# Patient Record
Sex: Male | Born: 1960 | Race: Black or African American | Hispanic: No | Smoking: Former smoker
Health system: Southern US, Community
[De-identification: ages and names within clinical notes are randomized; demographics above are authoritative.]

## PROBLEM LIST (undated history)

## (undated) DIAGNOSIS — N529 Male erectile dysfunction, unspecified: Secondary | ICD-10-CM

## (undated) DIAGNOSIS — N401 Enlarged prostate with lower urinary tract symptoms: Secondary | ICD-10-CM

## (undated) DIAGNOSIS — J449 Chronic obstructive pulmonary disease, unspecified: Secondary | ICD-10-CM

## (undated) DIAGNOSIS — K7460 Unspecified cirrhosis of liver: Secondary | ICD-10-CM

## (undated) DIAGNOSIS — J984 Other disorders of lung: Secondary | ICD-10-CM

## (undated) DIAGNOSIS — K8020 Calculus of gallbladder without cholecystitis without obstruction: Secondary | ICD-10-CM

## (undated) DIAGNOSIS — F17200 Nicotine dependence, unspecified, uncomplicated: Secondary | ICD-10-CM

## (undated) DIAGNOSIS — J4540 Moderate persistent asthma, uncomplicated: Secondary | ICD-10-CM

## (undated) DIAGNOSIS — Z8619 Personal history of other infectious and parasitic diseases: Secondary | ICD-10-CM

## (undated) DIAGNOSIS — R112 Nausea with vomiting, unspecified: Secondary | ICD-10-CM

## (undated) DIAGNOSIS — Z9889 Other specified postprocedural states: Secondary | ICD-10-CM

## (undated) DIAGNOSIS — B1920 Unspecified viral hepatitis C without hepatic coma: Secondary | ICD-10-CM

## (undated) DIAGNOSIS — J432 Centrilobular emphysema: Secondary | ICD-10-CM

## (undated) DIAGNOSIS — F1911 Other psychoactive substance abuse, in remission: Secondary | ICD-10-CM

## (undated) DIAGNOSIS — J439 Emphysema, unspecified: Secondary | ICD-10-CM

## (undated) DIAGNOSIS — R0902 Hypoxemia: Secondary | ICD-10-CM

## (undated) DIAGNOSIS — G8929 Other chronic pain: Secondary | ICD-10-CM

## (undated) HISTORY — DX: Calculus of gallbladder without cholecystitis without obstruction: K80.20

## (undated) HISTORY — DX: Hypoxemia: R09.02

## (undated) HISTORY — PX: KNEE ARTHROSCOPY: SUR90

## (undated) HISTORY — PX: COLONOSCOPY: SHX174

## (undated) HISTORY — DX: Unspecified cirrhosis of liver: K74.60

## (undated) HISTORY — DX: Chronic obstructive pulmonary disease, unspecified: J44.9

## (undated) HISTORY — PX: UPPER GASTROINTESTINAL ENDOSCOPY: SHX188

---

## 1998-08-07 ENCOUNTER — Emergency Department (HOSPITAL_COMMUNITY): Admission: EM | Admit: 1998-08-07 | Discharge: 1998-08-07 | Payer: Self-pay | Admitting: Emergency Medicine

## 1998-11-18 ENCOUNTER — Emergency Department (HOSPITAL_COMMUNITY): Admission: EM | Admit: 1998-11-18 | Discharge: 1998-11-18 | Payer: Self-pay | Admitting: Emergency Medicine

## 1998-12-26 ENCOUNTER — Ambulatory Visit (HOSPITAL_COMMUNITY): Admission: RE | Admit: 1998-12-26 | Discharge: 1998-12-26 | Payer: Self-pay | Admitting: Orthopaedic Surgery

## 1999-06-03 ENCOUNTER — Emergency Department (HOSPITAL_COMMUNITY): Admission: EM | Admit: 1999-06-03 | Discharge: 1999-06-03 | Payer: Self-pay | Admitting: Emergency Medicine

## 1999-06-03 ENCOUNTER — Encounter: Payer: Self-pay | Admitting: Emergency Medicine

## 1999-12-18 ENCOUNTER — Emergency Department (HOSPITAL_COMMUNITY): Admission: EM | Admit: 1999-12-18 | Discharge: 1999-12-18 | Payer: Self-pay | Admitting: Emergency Medicine

## 2004-01-23 ENCOUNTER — Emergency Department (HOSPITAL_COMMUNITY): Admission: EM | Admit: 2004-01-23 | Discharge: 2004-01-23 | Payer: Self-pay | Admitting: Emergency Medicine

## 2005-11-11 ENCOUNTER — Ambulatory Visit: Payer: Self-pay | Admitting: Internal Medicine

## 2005-11-14 ENCOUNTER — Ambulatory Visit: Payer: Self-pay | Admitting: *Deleted

## 2005-11-15 ENCOUNTER — Ambulatory Visit (HOSPITAL_COMMUNITY): Admission: RE | Admit: 2005-11-15 | Discharge: 2005-11-15 | Payer: Self-pay | Admitting: Internal Medicine

## 2008-08-14 ENCOUNTER — Ambulatory Visit: Payer: Self-pay | Admitting: Internal Medicine

## 2008-08-14 LAB — CONVERTED CEMR LAB
AST: 151 units/L — ABNORMAL HIGH (ref 0–37)
Albumin: 3.3 g/dL — ABNORMAL LOW (ref 3.5–5.2)
Alkaline Phosphatase: 121 units/L — ABNORMAL HIGH (ref 39–117)
Basophils Absolute: 0 10*3/uL (ref 0.0–0.1)
Basophils Relative: 1 % (ref 0–1)
Eosinophils Absolute: 0.1 10*3/uL (ref 0.0–0.7)
Eosinophils Relative: 3 % (ref 0–5)
Glucose, Bld: 84 mg/dL (ref 70–99)
HCT: 45.5 % (ref 39.0–52.0)
Lymphs Abs: 2.1 10*3/uL (ref 0.7–4.0)
MCHC: 32.7 g/dL (ref 30.0–36.0)
MCV: 98.3 fL (ref 78.0–100.0)
Neutrophils Relative %: 32 % — ABNORMAL LOW (ref 43–77)
Platelets: 81 10*3/uL — ABNORMAL LOW (ref 150–400)
Potassium: 4.1 meq/L (ref 3.5–5.3)
RDW: 15.1 % (ref 11.5–15.5)
Sodium: 137 meq/L (ref 135–145)
Total Bilirubin: 1.6 mg/dL — ABNORMAL HIGH (ref 0.3–1.2)
Total Protein: 7.8 g/dL (ref 6.0–8.3)
WBC: 4.1 10*3/uL (ref 4.0–10.5)

## 2008-08-25 ENCOUNTER — Ambulatory Visit: Payer: Self-pay | Admitting: Internal Medicine

## 2008-08-25 ENCOUNTER — Encounter (INDEPENDENT_AMBULATORY_CARE_PROVIDER_SITE_OTHER): Payer: Self-pay | Admitting: Internal Medicine

## 2008-08-25 LAB — CONVERTED CEMR LAB
Eosinophils Relative: 2 % (ref 0–5)
HCT: 45 % (ref 39.0–52.0)
Hep B E Ab: NEGATIVE
Hep B S Ab: NEGATIVE
Hepatitis B Surface Ag: NEGATIVE
Lymphocytes Relative: 41 % (ref 12–46)
Lymphs Abs: 2 10*3/uL (ref 0.7–4.0)
MCV: 99.3 fL (ref 78.0–100.0)
Monocytes Relative: 13 % — ABNORMAL HIGH (ref 3–12)
Neutrophils Relative %: 43 % (ref 43–77)
Platelets: 79 10*3/uL — ABNORMAL LOW (ref 150–400)
RBC: 4.53 M/uL (ref 4.22–5.81)
WBC: 4.8 10*3/uL (ref 4.0–10.5)

## 2008-09-12 ENCOUNTER — Ambulatory Visit (HOSPITAL_COMMUNITY): Admission: RE | Admit: 2008-09-12 | Discharge: 2008-09-12 | Payer: Self-pay | Admitting: Internal Medicine

## 2008-09-16 ENCOUNTER — Encounter (INDEPENDENT_AMBULATORY_CARE_PROVIDER_SITE_OTHER): Payer: Self-pay | Admitting: Internal Medicine

## 2008-09-16 ENCOUNTER — Ambulatory Visit: Payer: Self-pay | Admitting: Family Medicine

## 2008-09-16 LAB — CONVERTED CEMR LAB
HIV 1 RNA Quant: <48 copies/mL (ref <48)
HIV-1 RNA Quant, Log: <1.68 (ref <1.68)

## 2008-09-29 ENCOUNTER — Ambulatory Visit: Payer: Self-pay | Admitting: Internal Medicine

## 2008-10-09 ENCOUNTER — Ambulatory Visit: Payer: Self-pay | Admitting: Gastroenterology

## 2008-10-09 ENCOUNTER — Encounter: Payer: Self-pay | Admitting: Gastroenterology

## 2008-10-17 ENCOUNTER — Telehealth: Payer: Self-pay | Admitting: Internal Medicine

## 2008-11-10 ENCOUNTER — Ambulatory Visit: Payer: Self-pay | Admitting: Gastroenterology

## 2008-11-10 DIAGNOSIS — B182 Chronic viral hepatitis C: Secondary | ICD-10-CM | POA: Insufficient documentation

## 2008-11-13 ENCOUNTER — Ambulatory Visit: Payer: Self-pay | Admitting: Gastroenterology

## 2008-11-13 ENCOUNTER — Telehealth: Payer: Self-pay | Admitting: Gastroenterology

## 2008-11-20 ENCOUNTER — Ambulatory Visit (HOSPITAL_COMMUNITY): Admission: RE | Admit: 2008-11-20 | Discharge: 2008-11-20 | Payer: Self-pay | Admitting: Gastroenterology

## 2008-11-27 ENCOUNTER — Encounter: Payer: Self-pay | Admitting: Internal Medicine

## 2009-01-06 ENCOUNTER — Ambulatory Visit: Payer: Self-pay | Admitting: Gastroenterology

## 2009-03-19 ENCOUNTER — Ambulatory Visit: Payer: Self-pay | Admitting: Gastroenterology

## 2009-03-20 ENCOUNTER — Ambulatory Visit (HOSPITAL_COMMUNITY): Admission: RE | Admit: 2009-03-20 | Discharge: 2009-03-20 | Payer: Self-pay | Admitting: Gastroenterology

## 2009-03-31 ENCOUNTER — Ambulatory Visit: Payer: Self-pay | Admitting: Gastroenterology

## 2009-04-21 ENCOUNTER — Ambulatory Visit: Payer: Self-pay | Admitting: Gastroenterology

## 2009-05-11 ENCOUNTER — Ambulatory Visit: Payer: Self-pay | Admitting: Gastroenterology

## 2009-06-04 ENCOUNTER — Ambulatory Visit: Payer: Self-pay | Admitting: Gastroenterology

## 2009-07-09 ENCOUNTER — Ambulatory Visit: Payer: Self-pay | Admitting: Gastroenterology

## 2009-09-03 ENCOUNTER — Ambulatory Visit: Payer: Self-pay | Admitting: Gastroenterology

## 2009-09-14 ENCOUNTER — Ambulatory Visit (HOSPITAL_COMMUNITY): Admission: RE | Admit: 2009-09-14 | Discharge: 2009-09-14 | Payer: Self-pay | Admitting: Gastroenterology

## 2009-10-09 ENCOUNTER — Encounter: Payer: Self-pay | Admitting: Gastroenterology

## 2009-10-20 ENCOUNTER — Encounter (INDEPENDENT_AMBULATORY_CARE_PROVIDER_SITE_OTHER): Payer: Self-pay | Admitting: *Deleted

## 2010-03-23 ENCOUNTER — Encounter (INDEPENDENT_AMBULATORY_CARE_PROVIDER_SITE_OTHER): Payer: Self-pay | Admitting: *Deleted

## 2010-03-24 ENCOUNTER — Ambulatory Visit: Payer: Self-pay | Admitting: Gastroenterology

## 2010-03-29 ENCOUNTER — Ambulatory Visit: Payer: Self-pay | Admitting: Gastroenterology

## 2010-05-06 ENCOUNTER — Ambulatory Visit: Payer: Self-pay | Admitting: Gastroenterology

## 2010-06-21 ENCOUNTER — Ambulatory Visit (HOSPITAL_COMMUNITY): Admission: RE | Admit: 2010-06-21 | Discharge: 2010-06-21 | Payer: Self-pay | Admitting: Gastroenterology

## 2010-09-11 ENCOUNTER — Emergency Department (HOSPITAL_COMMUNITY)
Admission: EM | Admit: 2010-09-11 | Discharge: 2010-09-11 | Payer: Self-pay | Source: Home / Self Care | Admitting: Emergency Medicine

## 2010-09-18 ENCOUNTER — Encounter: Payer: Self-pay | Admitting: Gastroenterology

## 2010-09-19 ENCOUNTER — Encounter: Payer: Self-pay | Admitting: Gastroenterology

## 2010-09-28 NOTE — Procedures (Signed)
Summary: Upper Endoscopy  Patient: [name] Note: All result statuses are Final unless otherwise noted.  Tests: (1) Upper Endoscopy (EGD)   EGD Upper Endoscopy       DONE     [facility]     [address]     [place], [place]  [address]           ENDOSCOPY PROCEDURE REPORT           PATIENT:  Dylan [name], Dylan [name]  MR#:  [id number]     BIRTHDATE:  1961/01/16, 49 yrs. old  GENDER:  male           ENDOSCOPIST:  Barbette Hair. Arlyce Dice, MD     Referred by:           PROCEDURE DATE:  03/29/2010     PROCEDURE:  EGD, diagnostic     ASA CLASS:  Class II     INDICATIONS:  Known esophageal varices, for surveillance exam.           MEDICATIONS:   Fentanyl 75 mcg IV, Versed 9 mg IV, glycopyrrolate     (Robinal) 0.2 mg IV, 0.6cc simethancone 0.6 cc PO     TOPICAL ANESTHETIC:  Exactacain Spray           DESCRIPTION OF PROCEDURE:   After the risks benefits and     alternatives of the procedure were thoroughly explained, informed     consent was obtained.  The LB GIF-H180 [id number] endoscope was     introduced through the mouth and advanced to the third portion of     the duodenum, without limitations.  The instrument was slowly     withdrawn as the mucosa was fully examined.     <<PROCEDUREIMAGES>>           Grade I varices were found in the distal esophagus (see image1 and     image2). Few grade 1 varices at GE junction extending 3-4cm     Otherwise the examination was normal (see image3 and image5).     Retroflexed views revealed no abnormalities.    The scope was then     withdrawn from the patient and the procedure completed.           COMPLICATIONS:  None           ENDOSCOPIC IMPRESSION:     1) Grade I varices in the distal esophagus     2) Otherwise normal examination     RECOMMENDATIONS:F/U endoscopy 2 years           REPEAT EXAM:  In 2 year(s) for EGD.           ______________________________     Barbette Hair. Arlyce Dice, MD           CC:  Brooke Dare, MD, Yisroel Ramming, MD       n.     eSIGNED:   Barbette Hair. Theordore Cisnero at 03/29/2010 11:50 AM           Montel Culver, [id number]  Note: An exclamation mark (!) indicates a result that was not dispersed into the flowsheet. Document Creation Date: 03/29/2010 11:50 AM _______________________________________________________________________  (1) Order result status: Final Collection or observation date-time: 03/29/2010 11:29 Requested date-time:  Receipt date-time:  Reported date-time:  Referring Physician:   Ordering Physician: Melvia Heaps [phone]) Specimen Source:  Source: Launa Grill Order Number: [id number] Lab site:   Appended Document: Upper Endoscopy    Clinical Lists Changes  Observations: Added new  observation of EGD DUE: 03/2012 (03/29/2010 13:47)

## 2010-09-28 NOTE — Miscellaneous (Signed)
Summary: REC EGD/previsit done/rm  Clinical Lists Changes  Observations: Added new observation of ALLERGY REV: Done (03/24/2010 8:28)

## 2010-09-28 NOTE — Letter (Signed)
Summary: Endoscopy Letter  [facility]  [address] [place], [place] [address]   Phone: [phone]  Fax: [phone]      October 20, 2009 MRN: [id number]   KORDEL LEAVY [address] [place], [place]  [address]   Dear Mr. SELF,   According to your medical record, it is time for you to schedule an Endoscopy. Endoscopic screening is recommended for patients with certain upper digestive tract conditions because of associated increased risk for cancers of the upper digestive system.  This letter has been generated based on the recommendations made at the time of your prior procedure. If you feel that in your particular situation this may no longer apply, please contact our office.  Please call our office at [phone]) to schedule this appointment or to update your records at your earliest convenience.  Thank you for cooperating with [place] to provide you with the very best care possible.   Sincerely,  Barbette Hair. Arlyce Dice, M.D.  [facility] [phone]

## 2010-09-28 NOTE — Procedures (Signed)
Summary: Recall / [facility]  Recall /  [facility]   Imported By: Lennie Odor 01/28/2010 14:36:14  _____________________________________________________________________  External Attachment:    Type:   Image     Comment:   External Document

## 2010-09-28 NOTE — Letter (Signed)
Summary: EGD Instructions  [facility]  [address] [place], [place] [address]   Phone: [phone]  Fax: [phone]       Dylan [name]    [birth year]    MRN: [id number]       Procedure Day /Date:  Monday 03/29/2010     Arrival Time: 10:00 am     Procedure Time: 11:00 am     Location of Procedure:                    _x  _ [facility] (4th Floor)    PREPARATION FOR ENDOSCOPY   On Monday 8/1  THE DAY OF THE PROCEDURE:  1.   No solid foods, milk or milk products are allowed after midnight the night before your procedure.  2.   Do not drink anything colored red or purple.  Avoid juices with pulp.  No orange juice.  3.  You may drink clear liquids until 9:00 am, which is 2 hours before your procedure.                                                                                                CLEAR LIQUIDS INCLUDE: Water Jello Ice Popsicles Tea (sugar ok, no milk/cream) Powdered fruit flavored drinks Coffee (sugar ok, no milk/cream) Gatorade Juice: apple, white grape, white cranberry  Lemonade Clear bullion, consomm, broth Carbonated beverages (any kind) Strained chicken noodle soup Hard Candy   MEDICATION INSTRUCTIONS  Unless otherwise instructed, you should take regular prescription medications with a small sip of water as early as possible the morning of your procedure.    Additional medication instructions: n/a             OTHER INSTRUCTIONS  You will need a responsible adult at least 51 years of age to accompany you and drive you home.   This person must remain in the waiting room during your procedure.  Wear loose fitting clothing that is easily removed.  Leave jewelry and other valuables at home.  However, you may wish to bring a book to read or an iPod/MP3 player to listen to music as you wait for your procedure to start.  Remove all body piercing jewelry and leave at home.  Total time from sign-in until discharge  is approximately 2-3 hours.  You should go home directly after your procedure and rest.  You can resume normal activities the day after your procedure.  The day of your procedure you should not:   Drive   Make legal decisions   Operate machinery   Drink alcohol   Return to work  You will receive specific instructions about eating, activities and medications before you leave.    The above instructions have been reviewed and explained to me by   Sherren Kerns RN  March 24, 2010 8:52 AM     I fully understand and can verbalize these instructions _____________________________ Date _________

## 2010-12-05 LAB — BUN: BUN: 10 mg/dL (ref 6–23)

## 2010-12-09 LAB — CREATININE, SERUM
Creatinine, Ser: 0.94 mg/dL (ref 0.4–1.5)
GFR calc non Af Amer: >60 mL/min (ref >60)

## 2011-03-15 ENCOUNTER — Other Ambulatory Visit: Payer: Self-pay | Admitting: Gastroenterology

## 2011-03-15 DIAGNOSIS — B182 Chronic viral hepatitis C: Secondary | ICD-10-CM

## 2011-03-18 ENCOUNTER — Other Ambulatory Visit (HOSPITAL_COMMUNITY): Payer: Self-pay

## 2011-03-18 ENCOUNTER — Ambulatory Visit (HOSPITAL_COMMUNITY)
Admission: RE | Admit: 2011-03-18 | Discharge: 2011-03-18 | Disposition: A | Discharge status: Home / Self Care | Payer: Self-pay | Source: Ambulatory Visit | Attending: Gastroenterology | Admitting: Gastroenterology

## 2011-03-18 DIAGNOSIS — B182 Chronic viral hepatitis C: Secondary | ICD-10-CM | POA: Insufficient documentation

## 2011-03-18 DIAGNOSIS — K7460 Unspecified cirrhosis of liver: Secondary | ICD-10-CM | POA: Insufficient documentation

## 2011-03-18 LAB — CREATININE, SERUM
Creatinine, Ser: 0.95 mg/dL (ref 0.50–1.35)
GFR calc Af Amer: >60 mL/min (ref >60)
GFR calc non Af Amer: >60 mL/min (ref >60)

## 2011-03-18 MED ORDER — GADOBENATE DIMEGLUMINE 529 MG/ML IV SOLN
14.0000 mL | Freq: Once | INTRAVENOUS | Status: AC
  Administered 2011-03-18: 14 mL via INTRAVENOUS

## 2011-03-24 ENCOUNTER — Other Ambulatory Visit: Payer: Self-pay | Admitting: Gastroenterology

## 2011-03-24 ENCOUNTER — Ambulatory Visit (INDEPENDENT_AMBULATORY_CARE_PROVIDER_SITE_OTHER): Payer: Self-pay | Admitting: Gastroenterology

## 2011-03-24 VITALS — BP 144/87 | HR 87 | Temp 98.2°F | Ht 66.5 in | Wt 161.0 lb

## 2011-03-24 DIAGNOSIS — K7460 Unspecified cirrhosis of liver: Secondary | ICD-10-CM

## 2011-03-24 DIAGNOSIS — B182 Chronic viral hepatitis C: Secondary | ICD-10-CM

## 2011-03-24 LAB — COMPREHENSIVE METABOLIC PANEL
AST: 156 U/L — ABNORMAL HIGH (ref 0–37)
Alkaline Phosphatase: 159 U/L — ABNORMAL HIGH (ref 39–117)
BUN: 9 mg/dL (ref 6–23)
Calcium: 8.8 mg/dL (ref 8.4–10.5)
Chloride: 105 mEq/L (ref 96–112)
Creat: 1.02 mg/dL (ref 0.50–1.35)
Total Bilirubin: 1.6 mg/dL — ABNORMAL HIGH (ref 0.3–1.2)

## 2011-03-24 LAB — PROTIME-INR: Prothrombin Time: 15.2 seconds (ref 11.6–15.2)

## 2011-03-25 LAB — CBC WITH DIFFERENTIAL/PLATELET
Basophils Absolute: 0 10*3/uL (ref 0.0–0.1)
Basophils Relative: 0 % (ref 0–1)
Eosinophils Absolute: 0.2 10*3/uL (ref 0.0–0.7)
Eosinophils Relative: 5 % (ref 0–5)
HCT: 42.6 % (ref 39.0–52.0)
Hemoglobin: 14.8 g/dL (ref 13.0–17.0)
Lymphocytes Relative: 45 % (ref 12–46)
Lymphs Abs: 2.1 10*3/uL (ref 0.7–4.0)
MCH: 33.9 pg (ref 26.0–34.0)
MCHC: 34.7 g/dL (ref 30.0–36.0)
MCV: 97.7 fL (ref 78.0–100.0)
Monocytes Absolute: 0.6 10*3/uL (ref 0.1–1.0)
Monocytes Relative: 14 % — ABNORMAL HIGH (ref 3–12)
Neutro Abs: 1.6 10*3/uL — ABNORMAL LOW (ref 1.7–7.7)
Neutrophils Relative %: 36 % — ABNORMAL LOW (ref 43–77)
Platelets: 69 10*3/uL — ABNORMAL LOW (ref 150–400)
RBC: 4.36 MIL/uL (ref 4.22–5.81)
RDW: 17.3 % — ABNORMAL HIGH (ref 11.5–15.5)
WBC: 4.5 10*3/uL (ref 4.0–10.5)

## 2011-03-30 ENCOUNTER — Telehealth: Payer: Self-pay | Admitting: Gastroenterology

## 2011-03-30 DIAGNOSIS — K7460 Unspecified cirrhosis of liver: Secondary | ICD-10-CM

## 2011-03-30 DIAGNOSIS — B182 Chronic viral hepatitis C: Secondary | ICD-10-CM

## 2011-03-30 DIAGNOSIS — C228 Malignant neoplasm of liver, primary, unspecified as to type: Secondary | ICD-10-CM

## 2011-03-30 NOTE — Telephone Encounter (Signed)
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 A three month follow up in [place] was suggested. I explained this to Dylan [name]. We also discussed treatment of his Hep C. I discussed the possible diminished response rate due to ethnicity and cirrhosis and the side effects of treatment including the risk of infection leading to decompensation. Dylan [name] is willing to consider treatment. He will be by th [facility] office tomorrow to get the [employer] and [place] forms. We also discussed the risks of contagion.

## 2011-03-31 ENCOUNTER — Other Ambulatory Visit: Payer: Self-pay | Admitting: Gastroenterology

## 2011-03-31 DIAGNOSIS — B182 Chronic viral hepatitis C: Secondary | ICD-10-CM

## 2011-03-31 DIAGNOSIS — K7460 Unspecified cirrhosis of liver: Secondary | ICD-10-CM

## 2011-03-31 NOTE — Progress Notes (Signed)
NAME:  Dylan [name], Dylan [name]  Dylan#:  [id number]      DATE:  03/24/2011  DOB:  Apr 08, 1961    cc:     REFERRING PHYSICIAN:  Raeanne Gathers, NNP [facility]  [address]   [place], [place]   [address]  Fax:  [phone]   PRIMARY CARE PHYSICIAN:  None.    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   HISTORY:  The patient returns today unaccompanied.  He was last seen on 05/06/2010 at which point he was supposed to be sent to [facility] for treatment of his hepatitis C.  He was seen on 06/24/2010, at which  point he was supposed to be set up for teaching. It appears that this was never done, and now the patient returns here to the [place] office.  He complains of significant pruritus all over which is particularly disturbing at night.  He also complains of "bumps" over skin and  significant fatigue.  There are no fevers.  His weight is up by 10 pounds from previously on 05/06/2010.    PAST medical HISTORY:  No interval change of note, which is effectively negative.    Current medications:  None.    Allergies:  Denies.    Smoking:  A few cigarettes per day.    Alcohol:  Denies interval consumption.   REVIEW OF SYSTEMS:  All 10 systems reviewed today with the patient and are negative other than that which is mentioned above. CES-D was incomplete.    PHYSICAL EXAMINATION:   Constitutional:  This is a thin-appearing patient but no significant peripheral wasting.    Vital signs:  Height 66.5 inches.  Weight 161 pounds.  Blood pressure 144/87.  Pulse of 87.  Temperature of 98.2 degrees Fahrenheit.   HEENT:  Ears, nose, mouth and throat unremarkable.  Oropharynx clear.   neck:  No thyromegaly or neck masses.   Chest:  Resonant to percussion. Clear to auscultation.   Cardiovascular:  Heart sounds normal.  S1, S2  without murmurs or rubs. There is no peripheral edema.  ABDOMEN:  Normal bowel sounds. No masses or tenderness. I could not appreciate liver edge or spleen tip.   Lymphatics:  No cervical or inguinal lymphadenopathy.    neurologic:  No asterixis or localizing findings.  DERMATOLOGIC:  Anicteric. There is no palmar erythema.   Eyes:  Anicteric sclerae. Pupils are equal and reactive to light.   Laboratory data:  I could not find relevant labs through [facility].   MRI on 03/18/2011 in comparison to the 06/21/2010 MRI showed cirrhotic liver without splenomegaly. There are numerous areas of early enhancement, some of which were wedge-shaped which could represent  vascular shunts.  However, there are others that were nodular. Some worrisome for dysplastic nodules or small hepatocellular carcinomas.   ASSESSMENT:  The patient is a 50 year old gentleman with a history of genotype 1 hepatitis C, IL28-B, CT genotype, and previous experience with treatment with Pegasys and ribavirin, rendering him at best a partial  responder, although possibly a null responder because I did not have a week 12 HCV RNA done at  the previous treatment.  In treating him I will  assume he is a null responder. In addition because of his  cirrhosis he would need 48 weeks of treatment rather than truncated  therapy if he was to tolerate PEG interferon, ribavirin, and Telaprevir.  In terms of his cirrhosis care, he was screened for varices on 04/08/2010 showing grade 1 esophageal varices, for which 2 year follow up was suggested by Dr. Arlyce Dice. His MRI has been done 03/18/2011 and  will need to be reviewed at [facility]. There is no ascites or encephalopathy to treat.  He has not obtained his hepatitis A and B vaccinations through his health department in the past.   His pruritus may be a manifestation of advanced liver disease compared to previous. This can be treated with antihistamines as a first-line.  In terms of starting treatment for  hepatitis C, as an assumed null responder he would need 48 weeks of therapy including 12 weeks initially of Telaprevir.  I have asked him to complete the application  for medication, as he lacks insurance.  I would like to see what his lab tests show in addition to having his MRI reviewed at [facility] to establish that there is no hepatocellular carcinoma before commencing  on therapy for hepatitis C.  In my discussion today with the patient we discussed treatment of his pruritus with hydroxyzine. Unfortunately, this is not available through the [employer] $4 monthly prescription plan. I told him to check  out the price and contact me through [facility]. I have explained that I would like to have his MRI reviewed at [facility] and obtain labs today before making a decision about putting him on therapy.   plan:  1. Hepatitis A and B vaccinations to be done through his local health department. 2. Hydroxyzine 50 mg p.o. at bedtime, 30 day supply, with 5 refills, given for pruritus. 3. Standard labs today to check his MELD score. 4. I will have his MRI from 03/18/2011 reviewed at [facility] to clarify what these lesions are. I have asked the office staff to make a disk and have it mailed. This will be reviewed at the next hepatobiliary conference on 03/30/2011. 5. Followup will be determined based on the results of all testing. 6. I will complete the application for patient assistance once I have reviewed his labs and his MRI, assuming these are all benign.            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  A three month follow up in [place] was suggested.  I explained this to Dylan [name].  We also discussed treatment of his Hep C.  I discussed the possible diminished response rate due to ethnicity and cirrhosis and the side effects of treatment including the risk of infection leading to decompensation.   Dylan [name] is willing to consider treatment.  He will be by th [facility] office tomorrow to get the [employer] and [place] forms.  We also discussed the risks of contagion.   [phone] .[id number]  D:  Thu Jul 26 19:53:47 2012 ; T:  Sat Jul 28 11:47:44 2012  Job #:  [id number]

## 2011-04-25 ENCOUNTER — Telehealth: Payer: Self-pay | Admitting: Gastroenterology

## 2011-04-25 NOTE — Telephone Encounter (Signed)
Without informing us of his receipt of medications, notwithstanding the fact that I asked to be informed of when he took delivery of his meds to bring him in for teaching, Dylan [name] started on his meds on 04/23/11.  Within an hour of taking his telaprevir, ribavirin, and peginterferon, he has chills and rigors.  He thought it was an "allergic reaction" to the telaprevir.  He stopped all meds, i.e. did not take any meds Sunday or today.  His wife asked about the dose of peginterferon, expecting to draw a dose out of a vial rather than using the prefilled syringes.  I explained that he should have informed us of receipt of his meds so that I could demonstrate how to take all meds and review all the common side effects.  I explained that this is not an allergic reaction to the telaprevir but a reaction to the peginterferon.  I discussed the management of the side effects including predosing acetaminophen or ibuprofen.  I also told him he was not to have stopped any of his meds because he risks resistance to the telaprevir.  He will resume all meds tonight.  I told him he must come to the [place] office for the first on treatment visit on Sept 6.  I will forward this to the staff to have them make an appt.  I told him to call the [facility] office for any future questions.

## 2011-05-05 ENCOUNTER — Ambulatory Visit (INDEPENDENT_AMBULATORY_CARE_PROVIDER_SITE_OTHER): Payer: Self-pay | Admitting: Gastroenterology

## 2011-05-05 VITALS — BP 132/77 | HR 84 | Temp 98.3°F | Ht 65.5 in | Wt 162.0 lb

## 2011-05-05 DIAGNOSIS — B182 Chronic viral hepatitis C: Secondary | ICD-10-CM

## 2011-05-06 LAB — CBC WITH DIFFERENTIAL/PLATELET
Eosinophils Absolute: 0 10*3/uL (ref 0.0–0.7)
HCT: 39.7 % (ref 39.0–52.0)
Hemoglobin: 14.1 g/dL (ref 13.0–17.0)
Lymphs Abs: 1.2 10*3/uL (ref 0.7–4.0)
MCH: 33.5 pg (ref 26.0–34.0)
Monocytes Relative: 18 % — ABNORMAL HIGH (ref 3–12)
Neutro Abs: 1.1 10*3/uL — ABNORMAL LOW (ref 1.7–7.7)
Neutrophils Relative %: 39 % — ABNORMAL LOW (ref 43–77)
RBC: 4.21 MIL/uL — ABNORMAL LOW (ref 4.22–5.81)

## 2011-05-19 NOTE — Progress Notes (Signed)
NAME:  IZACC, DEMEYER  MR#:  [id number]      DATE:  05/05/2011  DOB:  1960-12-07    cc:     Referring physician:   Byrd Hesselbach, NP [facility]  [address] [place], [place]   [address] Fax:  [phone]   Primary care physician:  None.    Consulting physician:  Melvia Heaps, MD  [facility]  [address] [place], [place]   [address] Fax:  [phone]   REASON FOR VISIT:  Follow up of genotype 1 hepatitis C, IL28B CT, on treatment week 2.   HISTORY:  The patient returns today unaccompanied. His date of commencement for treatment with Pegasys, ribavirin and telaprevir was 04/23/2011, putting him at approximately 12 days, or week 2, of therapy. He feels  that he is tolerating therapy quite well. The chills that he experienced with the initial dose of interferon have now dissipated. 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 There are no significant psychiatric side effects of therapy. He has not experienced any decompensated symptoms to suggest decompensated or cryoglobulin mediated disease.   PAST MEDICAL HISTORY:  No interval change.   Current medications:  1. Pegasys 180 mcg subcu weekly. 2. Ribavirin 600 mg p.o. a.m., 400 mg p.o. q. p.m.  3. Telaprevir 750 mg p.o. q.8 h.   ALLERGIES:  None.    habits:   Smoking:  Few cigarettes per day.    Alcohol:  Denies interval consumption.   REVIEW OF SYSTEMS:  All 10 systems reviewed today are negative other than that which was mentioned above. CES-D was 31.    PHYSICAL EXAMINATION:   VITAL SIGNS:  Height 65.5 inches, weight 162 pounds.  Blood pressure 132/77, pulse of 84, temperature is 98.3 degrees Fahrenheit.   LABORATORY DATA:  None today.  MRI on 03/18/2011 compared to 06/21/2010 MRI showed a cirrhotic liver without splenomegaly. This MRI was reviewed at [facility] because of the unclear reading in [place]. At our conference on 03/30/2011 it was    thought that he had multiple dysplastic nodules, and a 3 month followup was suggested.   ASSESSMENT:  The patient is a 50 year old man with a history of genotype 1 hepatitis C, IL28B CT, with previous experience with Pegasys and ribavirin, rendering him at best a partial responder, although he did  not have a week 12 hepatitis C RNA done at previous treatment, so I do not know whether he was a truly null responder or not. I am treating him as if he was a null responder expectantly.  He would need 3 months  of triple therapy leading to another 9 months of PEG-interferon and ribavirin. He is currently on week 2 of therapy.  In terms of his cirrhosis care, he was screened for varices on 04/08/2010, showing grade 1 esophageal varices.  A 2-year followup was suggested by Dr. Arlyce Dice. This really should be done within the year,  but will let Dr. Marzetta Board suggestions stand for now. His MRI will need to be repeated in in October 2012, as suggested at [facility]. There is no ascites or encephalopathy to treat. He has not obtained his hepatitis A and B vaccinations in the past.  In terms of his hepatitis C therapy, today I reviewed the course of therapy and expected duration of 48 weeks. We discussed the laboratory testing that he is to obtain. He then told me that he is no longer  qualified for  [employer], and his application for Medicaid has been rejected in the past. This effectively renders him uninsured as he starts to head into therapy for hepatitis C.    I have explained to him that we cannot continue treating him without the ability to see him in clinic and check laboratory work. I have warned that the laboratory testing alone would run into hundreds of  dollars a month. He indicated that he is still eligible for charity care at [facility], and I have explained that I would see him in [place] for followup and he can obtain his weekly laboratory testing through  [facility] if he truly is qualified for charity care  there. I understand from [employer] that his charity care expires on 01/19/2012.   PLAN:  1. He will get a CBC done today in [facility] for his week 2 CBC. 2. I will take his chart to [facility] for transfer of his care to [facility], where we will see him in 2 weeks' time for his week 4 visit, for another CBC and hepatitis C virus RNA. 3. While he is in transition I will not order another CBC for next week, as he will not be able to afford it. 4. If he transfers his care to [facility], then we will be able to immunize him for his hepatitis A and B through clinics there. 5. If he transfers to [facility], I will be able to do the EGD for screening for his grade 1 varices seen on 04/08/2010.  Will also be able to do his MRI for October 2012, as suggested by review of the MRI on 03/30/2011.            Brooke Dare, MD   [phone]  D:  Thu Sep 06 19:42:26 2012 ; T:  Sat Sep 08 14:04:39 2012  Job #:  [id number]

## 2011-07-14 ENCOUNTER — Other Ambulatory Visit: Payer: Self-pay | Admitting: Gastroenterology

## 2011-07-14 DIAGNOSIS — B182 Chronic viral hepatitis C: Secondary | ICD-10-CM

## 2011-07-14 DIAGNOSIS — K7460 Unspecified cirrhosis of liver: Secondary | ICD-10-CM

## 2011-07-20 ENCOUNTER — Other Ambulatory Visit: Payer: Self-pay | Admitting: Gastroenterology

## 2011-07-20 DIAGNOSIS — B182 Chronic viral hepatitis C: Secondary | ICD-10-CM

## 2011-07-20 DIAGNOSIS — K7460 Unspecified cirrhosis of liver: Secondary | ICD-10-CM

## 2011-08-09 ENCOUNTER — Inpatient Hospital Stay (HOSPITAL_COMMUNITY): Admission: RE | Admit: 2011-08-09 | Payer: Self-pay | Source: Ambulatory Visit

## 2011-11-10 ENCOUNTER — Inpatient Hospital Stay (HOSPITAL_COMMUNITY): Admission: RE | Admit: 2011-11-10 | Payer: Self-pay | Source: Ambulatory Visit

## 2011-11-11 ENCOUNTER — Ambulatory Visit (HOSPITAL_COMMUNITY)
Admission: RE | Admit: 2011-11-11 | Discharge: 2011-11-11 | Disposition: A | Discharge status: Home / Self Care | Payer: Self-pay | Source: Ambulatory Visit | Attending: Gastroenterology | Admitting: Gastroenterology

## 2011-11-11 DIAGNOSIS — B182 Chronic viral hepatitis C: Secondary | ICD-10-CM | POA: Insufficient documentation

## 2011-11-11 DIAGNOSIS — K7460 Unspecified cirrhosis of liver: Secondary | ICD-10-CM | POA: Insufficient documentation

## 2011-11-11 LAB — CREATININE, SERUM
Creatinine, Ser: 0.77 mg/dL (ref 0.50–1.35)
GFR calc Af Amer: >90 mL/min (ref >90)

## 2011-11-11 LAB — BUN: BUN: 9 mg/dL (ref 6–23)

## 2011-11-11 MED ORDER — GADOBENATE DIMEGLUMINE 529 MG/ML IV SOLN
14.0000 mL | Freq: Once | INTRAVENOUS | Status: AC
  Administered 2011-11-11: 14 mL via INTRAVENOUS

## 2011-12-22 ENCOUNTER — Other Ambulatory Visit: Payer: Self-pay | Admitting: Gastroenterology

## 2011-12-22 DIAGNOSIS — B182 Chronic viral hepatitis C: Secondary | ICD-10-CM

## 2012-01-09 ENCOUNTER — Other Ambulatory Visit: Payer: Self-pay | Admitting: Gastroenterology

## 2012-01-10 LAB — CBC WITH DIFFERENTIAL/PLATELET
Basophils Absolute: 0 10*3/uL (ref 0.0–0.1)
Lymphocytes Relative: 47 % — ABNORMAL HIGH (ref 12–46)
Neutro Abs: 0.7 10*3/uL — ABNORMAL LOW (ref 1.7–7.7)
Platelets: 24 10*3/uL — CL (ref 150–400)
RDW: 16.9 % — ABNORMAL HIGH (ref 11.5–15.5)
WBC: 1.9 10*3/uL — ABNORMAL LOW (ref 4.0–10.5)

## 2012-06-06 ENCOUNTER — Encounter: Payer: Self-pay | Admitting: Gastroenterology

## 2012-12-13 ENCOUNTER — Encounter: Payer: Self-pay | Admitting: Gastroenterology

## 2013-02-14 ENCOUNTER — Ambulatory Visit: Payer: Self-pay

## 2013-04-12 ENCOUNTER — Other Ambulatory Visit (HOSPITAL_COMMUNITY): Payer: Self-pay | Admitting: Nurse Practitioner

## 2013-04-12 ENCOUNTER — Ambulatory Visit (HOSPITAL_COMMUNITY)
Admission: RE | Admit: 2013-04-12 | Discharge: 2013-04-12 | Disposition: A | Discharge status: Home / Self Care | Payer: Self-pay | Source: Ambulatory Visit | Attending: Nurse Practitioner | Admitting: Nurse Practitioner

## 2013-04-12 DIAGNOSIS — R062 Wheezing: Secondary | ICD-10-CM | POA: Insufficient documentation

## 2013-04-12 DIAGNOSIS — R1110 Vomiting, unspecified: Secondary | ICD-10-CM | POA: Insufficient documentation

## 2014-04-11 ENCOUNTER — Inpatient Hospital Stay (HOSPITAL_COMMUNITY)
Admission: EM | Admit: 2014-04-11 | Discharge: 2014-04-13 | DRG: 192 | Disposition: A | Discharge status: Home / Self Care | Payer: Self-pay | Attending: Internal Medicine | Admitting: Internal Medicine

## 2014-04-11 ENCOUNTER — Encounter (HOSPITAL_COMMUNITY): Payer: Self-pay | Admitting: Emergency Medicine

## 2014-04-11 ENCOUNTER — Emergency Department (HOSPITAL_COMMUNITY): Payer: Self-pay

## 2014-04-11 DIAGNOSIS — J209 Acute bronchitis, unspecified: Principal | ICD-10-CM | POA: Diagnosis present

## 2014-04-11 DIAGNOSIS — D696 Thrombocytopenia, unspecified: Secondary | ICD-10-CM | POA: Diagnosis present

## 2014-04-11 DIAGNOSIS — Z87891 Personal history of nicotine dependence: Secondary | ICD-10-CM | POA: Diagnosis present

## 2014-04-11 DIAGNOSIS — K7460 Unspecified cirrhosis of liver: Secondary | ICD-10-CM | POA: Diagnosis present

## 2014-04-11 DIAGNOSIS — R0602 Shortness of breath: Secondary | ICD-10-CM

## 2014-04-11 DIAGNOSIS — F17200 Nicotine dependence, unspecified, uncomplicated: Secondary | ICD-10-CM | POA: Diagnosis present

## 2014-04-11 DIAGNOSIS — R062 Wheezing: Secondary | ICD-10-CM | POA: Diagnosis present

## 2014-04-11 DIAGNOSIS — B1920 Unspecified viral hepatitis C without hepatic coma: Secondary | ICD-10-CM | POA: Diagnosis present

## 2014-04-11 DIAGNOSIS — R000 Tachycardia, unspecified: Secondary | ICD-10-CM

## 2014-04-11 DIAGNOSIS — J9620 Acute and chronic respiratory failure, unspecified whether with hypoxia or hypercapnia: Secondary | ICD-10-CM | POA: Insufficient documentation

## 2014-04-11 DIAGNOSIS — J449 Chronic obstructive pulmonary disease, unspecified: Secondary | ICD-10-CM | POA: Diagnosis present

## 2014-04-11 DIAGNOSIS — J440 Chronic obstructive pulmonary disease with acute lower respiratory infection: Principal | ICD-10-CM | POA: Diagnosis present

## 2014-04-11 DIAGNOSIS — J42 Unspecified chronic bronchitis: Secondary | ICD-10-CM

## 2014-04-11 DIAGNOSIS — J441 Chronic obstructive pulmonary disease with (acute) exacerbation: Secondary | ICD-10-CM | POA: Insufficient documentation

## 2014-04-11 HISTORY — DX: Unspecified viral hepatitis C without hepatic coma: B19.20

## 2014-04-11 HISTORY — DX: Nicotine dependence, unspecified, uncomplicated: F17.200

## 2014-04-11 HISTORY — DX: Unspecified cirrhosis of liver: K74.60

## 2014-04-11 LAB — BASIC METABOLIC PANEL
Anion gap: 15 (ref 5–15)
BUN: 8 mg/dL (ref 6–23)
CALCIUM: 9.1 mg/dL (ref 8.4–10.5)
CHLORIDE: 102 meq/L (ref 96–112)
CO2: 24 meq/L (ref 19–32)
CREATININE: 1.14 mg/dL (ref 0.50–1.35)
GFR calc Af Amer: 83 mL/min — ABNORMAL LOW (ref >90)
GFR calc non Af Amer: 71 mL/min — ABNORMAL LOW (ref >90)
GLUCOSE: 92 mg/dL (ref 70–99)
Potassium: 3.7 mEq/L (ref 3.7–5.3)
Sodium: 141 mEq/L (ref 137–147)

## 2014-04-11 LAB — CBC WITH DIFFERENTIAL/PLATELET
Basophils Absolute: 0 10*3/uL (ref 0.0–0.1)
Basophils Relative: 0 % (ref 0–1)
EOS PCT: 2 % (ref 0–5)
Eosinophils Absolute: 0.1 10*3/uL (ref 0.0–0.7)
HEMATOCRIT: 46.8 % (ref 39.0–52.0)
HEMOGLOBIN: 16.2 g/dL (ref 13.0–17.0)
LYMPHS ABS: 0.7 10*3/uL (ref 0.7–4.0)
LYMPHS PCT: 14 % (ref 12–46)
MCH: 32.8 pg (ref 26.0–34.0)
MCHC: 34.6 g/dL (ref 30.0–36.0)
MCV: 94.7 fL (ref 78.0–100.0)
MONO ABS: 0.7 10*3/uL (ref 0.1–1.0)
Monocytes Relative: 15 % — ABNORMAL HIGH (ref 3–12)
NEUTROS ABS: 3.5 10*3/uL (ref 1.7–7.7)
Neutrophils Relative %: 69 % (ref 43–77)
Platelets: 83 10*3/uL — ABNORMAL LOW (ref 150–400)
RBC: 4.94 MIL/uL (ref 4.22–5.81)
RDW: 14.2 % (ref 11.5–15.5)
WBC: 5 10*3/uL (ref 4.0–10.5)

## 2014-04-11 LAB — I-STAT TROPONIN, ED: Troponin i, poc: 0 ng/mL (ref 0.00–0.08)

## 2014-04-11 LAB — I-STAT CG4 LACTIC ACID, ED: LACTIC ACID, VENOUS: 1.75 mmol/L (ref 0.5–2.2)

## 2014-04-11 MED ORDER — HEPARIN SODIUM (PORCINE) 5000 UNIT/ML IJ SOLN
5000.0000 [IU] | Freq: Three times a day (TID) | INTRAMUSCULAR | Status: DC
  Administered 2014-04-11 – 2014-04-13 (×6): 5000 [IU] via SUBCUTANEOUS
  Filled 2014-04-11 (×8): qty 1

## 2014-04-11 MED ORDER — SODIUM CHLORIDE 0.9 % IJ SOLN
3.0000 mL | Freq: Two times a day (BID) | INTRAMUSCULAR | Status: DC
  Administered 2014-04-11 – 2014-04-13 (×4): 3 mL via INTRAVENOUS

## 2014-04-11 MED ORDER — HYDROCOD POLST-CHLORPHEN POLST 10-8 MG/5ML PO LQCR
5.0000 mL | Freq: Every evening | ORAL | Status: DC | PRN
  Administered 2014-04-11: 5 mL via ORAL
  Filled 2014-04-11: qty 5

## 2014-04-11 MED ORDER — GUAIFENESIN ER 600 MG PO TB12
600.0000 mg | ORAL_TABLET | Freq: Two times a day (BID) | ORAL | Status: DC
  Administered 2014-04-11 – 2014-04-13 (×4): 600 mg via ORAL
  Filled 2014-04-11 (×6): qty 1

## 2014-04-11 MED ORDER — METHYLPREDNISOLONE SODIUM SUCC 125 MG IJ SOLR
125.0000 mg | Freq: Once | INTRAMUSCULAR | Status: AC
  Administered 2014-04-11: 125 mg via INTRAVENOUS
  Filled 2014-04-11: qty 2

## 2014-04-11 MED ORDER — SODIUM CHLORIDE 0.9 % IJ SOLN: 3.0000 mL | INTRAMUSCULAR | Status: DC | PRN

## 2014-04-11 MED ORDER — SODIUM CHLORIDE 0.9 % IV BOLUS (SEPSIS)
1000.0000 mL | Freq: Once | INTRAVENOUS | Status: AC
  Administered 2014-04-11: 1000 mL via INTRAVENOUS

## 2014-04-11 MED ORDER — SODIUM CHLORIDE 0.9 % IV SOLN: 250.0000 mL | INTRAVENOUS | Status: DC | PRN

## 2014-04-11 MED ORDER — ALBUTEROL SULFATE (2.5 MG/3ML) 0.083% IN NEBU
5.0000 mg | INHALATION_SOLUTION | Freq: Once | RESPIRATORY_TRACT | Status: AC
  Administered 2014-04-11: 5 mg via RESPIRATORY_TRACT
  Filled 2014-04-11: qty 6

## 2014-04-11 MED ORDER — ACETAMINOPHEN 500 MG PO TABS
1000.0000 mg | ORAL_TABLET | Freq: Once | ORAL | Status: AC
  Administered 2014-04-11: 1000 mg via ORAL
  Filled 2014-04-11: qty 2

## 2014-04-11 MED ORDER — PREDNISONE 20 MG PO TABS
20.0000 mg | ORAL_TABLET | Freq: Every day | ORAL | Status: DC
  Administered 2014-04-12: 20 mg via ORAL
  Filled 2014-04-11 (×2): qty 1

## 2014-04-11 MED ORDER — SODIUM CHLORIDE 0.9 % IJ SOLN: 3.0000 mL | Freq: Two times a day (BID) | INTRAMUSCULAR | Status: DC

## 2014-04-11 MED ORDER — ALBUTEROL (5 MG/ML) CONTINUOUS INHALATION SOLN
10.0000 mg/h | INHALATION_SOLUTION | RESPIRATORY_TRACT | Status: DC
  Administered 2014-04-11: 10 mg/h via RESPIRATORY_TRACT
  Filled 2014-04-11: qty 20

## 2014-04-11 MED ORDER — DOXYCYCLINE HYCLATE 100 MG PO TABS
100.0000 mg | ORAL_TABLET | Freq: Two times a day (BID) | ORAL | Status: DC
  Administered 2014-04-11 – 2014-04-13 (×5): 100 mg via ORAL
  Filled 2014-04-11 (×5): qty 1

## 2014-04-11 MED ORDER — IPRATROPIUM BROMIDE 0.02 % IN SOLN
0.5000 mg | Freq: Four times a day (QID) | RESPIRATORY_TRACT | Status: DC
  Administered 2014-04-11: 0.5 mg via RESPIRATORY_TRACT
  Filled 2014-04-11: qty 2.5

## 2014-04-11 MED ORDER — IPRATROPIUM BROMIDE 0.02 % IN SOLN
0.5000 mg | Freq: Once | RESPIRATORY_TRACT | Status: AC
  Administered 2014-04-11: 0.5 mg via RESPIRATORY_TRACT
  Filled 2014-04-11: qty 2.5

## 2014-04-11 MED ORDER — ALBUTEROL SULFATE (2.5 MG/3ML) 0.083% IN NEBU
2.5000 mg | INHALATION_SOLUTION | Freq: Four times a day (QID) | RESPIRATORY_TRACT | Status: DC
  Administered 2014-04-11: 2.5 mg via RESPIRATORY_TRACT
  Filled 2014-04-11: qty 3

## 2014-04-11 MED ORDER — ACETAMINOPHEN 650 MG RE SUPP: 650.0000 mg | Freq: Four times a day (QID) | RECTAL | Status: DC | PRN

## 2014-04-11 MED ORDER — ACETAMINOPHEN 325 MG PO TABS: 650.0000 mg | ORAL_TABLET | Freq: Four times a day (QID) | ORAL | Status: DC | PRN

## 2014-04-11 MED ORDER — NICOTINE 14 MG/24HR TD PT24
14.0000 mg | MEDICATED_PATCH | Freq: Every day | TRANSDERMAL | Status: DC
  Administered 2014-04-11 – 2014-04-13 (×3): 14 mg via TRANSDERMAL
  Filled 2014-04-11 (×3): qty 1

## 2014-04-11 NOTE — ED Notes (Signed)
Pt reports cough and wheezing for last few days; pt has audible wheezing

## 2014-04-11 NOTE — Progress Notes (Signed)
Dylan [name] [id number] Admission Data: 04/11/2014 6:14 PM Attending Provider: Axel Filler, MD  OMA:YOKH, ERIC, MD Consults/ Treatment Team:    Dylan [name] is a 53 y.o. male patient admitted from ED awake, alert  & orientated  X 3,  No Order, VSS - Blood pressure 116/82, pulse 126, temperature 98.7 F (37.1 C), temperature source Oral, resp. rate 18, height 5\' 7"  (1.702 m), weight 70.761 kg (156 lb), SpO2 93.00%., no c/o shortness of breath, no c/o chest pain, no distress noted. Tele # 1 placed and pt is currently running:sinus tachycardia.   IV site WDL:  forearm left, condition patent and no redness with a transparent dsg that's clean dry and intact.  Allergies:   Allergies  Allergen Reactions  . Other Nausea And Vomiting    Narcotics- pt states he does not like to take narcotics d/t makes him sick     History reviewed. No pertinent past medical history.  History:  obtained from the patient. Tobacco/alcohol:  Patient smokes 0.5 PPD for 40 years  Pt orientation to unit, room and routine. Information packet given to patient/family and safety video watched.  Admission INP armband ID verified with patient/family, and in place. SR up x 2, fall risk assessment complete with Patient and family verbalizing understanding of risks associated with falls. Pt verbalizes an understanding of how to use the call bell and to call for help before getting out of bed.  Skin, clean-dry- intact without evidence of bruising, or skin tears.   No evidence of skin break down noted on exam. no rashes, no ecchymoses, no petechiae    Will cont to monitor and assist as needed.  Lavern Maslow Margaretha Sheffield, RN 04/11/2014 6:14 PM

## 2014-04-11 NOTE — Progress Notes (Signed)
Notified MD oncall that pt's HR has been running in the 120's (sinus tach) on telemetry tonight. Pt is asymptomatic. No new orders given. Will continue to monitor pt. Ranelle Oyster, RN

## 2014-04-11 NOTE — H&P (Signed)
Date: 04/11/2014               Patient Name:  Dylan [name] MRN: [id number]  DOB: Jun 16, 1961 Age / Sex: 53 y.o., male   PCP: Rogers Blocker, MD         Medical Service: Internal Medicine Teaching Service         Attending Physician: Dr. Axel Filler, MD    First Contact: Dr. Genene Churn Pager: [phone]  Second Contact: Dr. Hayes Ludwig Pager: [phone]       After Hours (After 5p/  First Contact Pager: [phone]  weekends / holidays): Second Contact Pager: [phone]   Chief Complaint: SOB, chest tightness, wheezing  History of Present Illness:   53 yo without any reported PMH, 1PPDx40 years smoker comes in with 3 days of wheezing, shortness of breath, cough with yellow sputum production, and chest tightness. He has tried someone else's inhaler without any relief at home. Denies hx of copd or asthma. Denies fever/chills/n/v/diarrhea/dysuria. Has chest tightness and wheezing. Denies chest pain. Denies any leg swelling or face swelling. Denies sick contacts and recent travel.  Got 1x solumedrol in the ED and albuterol+atrovent treatment. Feeling somewhat better but still wheezing currently.   Meds: Current Facility-Administered Medications  Medication Dose Route Frequency Provider Last Rate Last Dose  . 0.9 %  sodium chloride infusion  250 mL Intravenous PRN Blain Pais, MD      . acetaminophen (TYLENOL) tablet 650 mg  650 mg Oral Q6H PRN Blain Pais, MD       Or  . acetaminophen (TYLENOL) suppository 650 mg  650 mg Rectal Q6H PRN Blain Pais, MD      . albuterol (PROVENTIL) (2.5 MG/3ML) 0.083% nebulizer solution 2.5 mg  2.5 mg Nebulization Q6H Blain Pais, MD      . chlorpheniramine-HYDROcodone (TUSSIONEX) 10-8 MG/5ML suspension 5 mL  5 mL Oral QHS PRN Blain Pais, MD      . doxycycline (VIBRA-TABS) tablet 100 mg  100 mg Oral Q12H Blain Pais, MD      . guaiFENesin (MUCINEX) 12 hr tablet 600 mg  600 mg Oral BID Blain Pais, MD      .  heparin injection 5,000 Units  5,000 Units Subcutaneous 3 times per day Blain Pais, MD      . ipratropium (ATROVENT) nebulizer solution 0.5 mg  0.5 mg Nebulization Q6H Blain Pais, MD      . Derrill Memo ON 04/12/2014] predniSONE (DELTASONE) tablet 20 mg  20 mg Oral Q breakfast Blain Pais, MD      . sodium chloride 0.9 % injection 3 mL  3 mL Intravenous Q12H Blain Pais, MD      . sodium chloride 0.9 % injection 3 mL  3 mL Intravenous Q12H Blain Pais, MD      . sodium chloride 0.9 % injection 3 mL  3 mL Intravenous PRN Blain Pais, MD       Allergies: Allergies as of 04/11/2014 - Review Complete 04/11/2014  Allergen Reaction Noted  . Other Nausea And Vomiting 04/11/2014   Past Medical History  Diagnosis Date  . Smoker     1/2 ppd for 40 years   Past Surgical History  Procedure Laterality Date  . Knee surgery     History reviewed. No pertinent family history. History   Social History  . Marital Status: Legally Separated    Spouse Name: N/A    Number of  Children: N/A  . Years of Education: N/A   Occupational History  . Not on file.   Social History Main Topics  . Smoking status: Current Every Day Smoker -- 0.50 packs/day for 40 years    Types: Cigarettes  . Smokeless tobacco: Not on file  . Alcohol Use: Yes     Comment: occasionally  . Drug Use: No  . Sexual Activity: Not on file   Other Topics Concern  . Not on file   Social History Narrative  . No narrative on file    Review of Systems: Review of Systems  Constitutional: Positive for malaise/fatigue. Negative for fever and chills.  HENT: Negative.   Eyes: Negative.   Respiratory: Positive for cough, sputum production, shortness of breath and wheezing. Negative for hemoptysis.   Cardiovascular: Positive for chest pain. Negative for palpitations, orthopnea, claudication, leg swelling and PND.  Gastrointestinal: Negative for heartburn, nausea, vomiting, abdominal pain,  diarrhea, constipation, blood in stool and melena.  Genitourinary: Negative.   Musculoskeletal: Negative.   Skin: Negative.   Neurological: Positive for dizziness and tremors. Negative for sensory change, speech change, focal weakness, seizures and loss of consciousness.  Endo/Heme/Allergies: Negative.   Psychiatric/Behavioral: Negative.     Physical Exam: Blood pressure 116/82, pulse 126, temperature 98.7 F (37.1 C), temperature source Oral, resp. rate 18, height 5\' 7"  (1.702 m), weight 70.761 kg (156 lb), SpO2 93.00%. Physical Exam  Constitutional: He is oriented to person, place, and time. He appears well-developed and well-nourished. He appears distressed.  HENT:  Head: Normocephalic and atraumatic.  Right Ear: External ear normal.  Left Ear: External ear normal.  Nose: Nose normal.  Mouth/Throat: Oropharynx is clear and moist.  Eyes: Conjunctivae and EOM are normal. Pupils are equal, round, and reactive to light. Right eye exhibits no discharge. Left eye exhibits no discharge. No scleral icterus.  Neck: Normal range of motion. Neck supple. No JVD present.  Cardiovascular: Normal rate and regular rhythm.  Exam reveals no gallop and no friction rub.   No murmur heard. Respiratory: He is in respiratory distress. He has wheezes. He has no rales. He exhibits no tenderness.  Expiratory wheezing diffusely  GI: Soft. Bowel sounds are normal. He exhibits no distension and no mass. There is no tenderness. There is no rebound and no guarding.  Musculoskeletal: Normal range of motion. He exhibits no edema and no tenderness.  Neurological: He is alert and oriented to person, place, and time. He has normal reflexes. No cranial nerve deficit.  Skin: He is diaphoretic.  Psychiatric: He has a normal mood and affect.     Lab results: Basic Metabolic Panel:  Recent Labs  04/11/14 1142  NA 141  K 3.7  CL 102  CO2 24  GLUCOSE 92  BUN 8  CREATININE 1.14  CALCIUM 9.1   Liver Function  Tests: No results found for this basename: AST, ALT, ALKPHOS, BILITOT, PROT, ALBUMIN,  in the last 72 hours No results found for this basename: LIPASE, AMYLASE,  in the last 72 hours No results found for this basename: AMMONIA,  in the last 72 hours CBC:  Recent Labs  04/11/14 1142  WBC 5.0  NEUTROABS 3.5  HGB 16.2  HCT 46.8  MCV 94.7  PLT 83*   Cardiac Enzymes: No results found for this basename: CKTOTAL, CKMB, CKMBINDEX, TROPONINI,  in the last 72 hours BNP: No results found for this basename: PROBNP,  in the last 72 hours D-Dimer: No results found for this basename:  DDIMER,  in the last 72 hours CBG: No results found for this basename: GLUCAP,  in the last 72 hours Hemoglobin A1C: No results found for this basename: HGBA1C,  in the last 72 hours Fasting Lipid Panel: No results found for this basename: CHOL, HDL, LDLCALC, TRIG, CHOLHDL, LDLDIRECT,  in the last 72 hours Thyroid Function Tests: No results found for this basename: TSH, T4TOTAL, FREET4, T3FREE, THYROIDAB,  in the last 72 hours Anemia Panel: No results found for this basename: VITAMINB12, FOLATE, FERRITIN, TIBC, IRON, RETICCTPCT,  in the last 72 hours Coagulation: No results found for this basename: LABPROT, INR,  in the last 72 hours Urine Drug Screen: Drugs of Abuse  No results found for this basename: labopia,  cocainscrnur,  labbenz,  amphetmu,  thcu,  labbarb    Alcohol Level: No results found for this basename: ETH,  in the last 72 hours Urinalysis: No results found for this basename: COLORURINE, APPERANCEUR, LABSPEC, PHURINE, GLUCOSEU, HGBUR, BILIRUBINUR, KETONESUR, PROTEINUR, UROBILINOGEN, NITRITE, LEUKOCYTESUR,  in the last 72 hours Misc. Labs:   Imaging results:  Dg Chest 2 View  04/11/2014   CLINICAL DATA:  Shortness of breath.  Cough.  Chest pain.  EXAM: CHEST  2 VIEW  COMPARISON:  04/12/2013  FINDINGS: The heart size and mediastinal contours are within normal limits. Both lungs are clear. The  visualized skeletal structures are unremarkable.  IMPRESSION: No active cardiopulmonary disease.   Electronically Signed   By: Earle Gell M.D.   On: 04/11/2014 12:20    Other results: EKG: sinus tachy 108.   Assessment & Plan by Problem: Principal Problem:   Chronic bronchitis with acute exacerbation Active Problems:   Acute bronchitis   SOB 2/2 to acute on chronic bronchitis -patient is a smoker, hasn't seen a PCP for a long time, is having exp wheezing, productive cough, and chest tightness. Don't have PFT's to confirm COPD. Never had dx of COPD. EKG shows sinus tachy. Afebrile, no WBC count. CXR is normal. - duoneb q6hr. Got solumedrol in ED. Will start prednisone tomorrow. Doxycycline. - mucinex+codeine for cough during the day, tussionex for night. - pulse ox ambulatory - SW discussion to f/up at wellness clinic. (missed appointment there before). - will need PFT's outpatient.  - nicotine for smoking cessation.   Tachycardia - could be 2/2 to respiratory distress.  - orthostats to make sure he is not dry.  Diet: regular Code: full Heparin for DVT ppx.  Dispo: Disposition is deferred at this time, awaiting improvement of current medical problems. Anticipated discharge in approximately 2-3 day(s).   The patient does have a current PCP Rogers Blocker, MD) and does need an [facility] hospital follow-up appointment after discharge.  The patient does not know have transportation limitations that hinder transportation to clinic appointments.  Signed: Dellia Nims, MD 04/11/2014, 7:10 PM

## 2014-04-11 NOTE — Progress Notes (Signed)
Pt is a XXX patient. Pt's ex-wife and other people came to unit, stating that pt had called her and told her that pt was in room 5w09. Nurse went into pt's room and told pt that he had visitors at nurse's station and that they state that he had called them. Pt stated to send the visitors to his room. Explained to pt and pt's girlfriend once ex-wife left that he doesn't need to tell anybody that he is here if he doesn't want visitors to come visit him and if he wants to be a xxx pt. Pt stated that he had told his ex-wife which room he was in before he became a xxx pt. Told pt that we would move him to a different room so his ex-wife would not know which room he is in. Pt stated that ex-wife will not be back and pt refuses to move rooms. Will continue to monitor pt. Ranelle Oyster, RN

## 2014-04-11 NOTE — Progress Notes (Signed)
Patient ambulated in hallway on room air with nurse tech. Pt's O2 sats ambulating on RA was 95%. Will continue to monitor pt. Ranelle Oyster, RN

## 2014-04-11 NOTE — Progress Notes (Signed)
Called [place], Rn to get report. Will call back currently in a trauma case.

## 2014-04-11 NOTE — ED Provider Notes (Signed)
Medical screening examination/treatment/procedure(s) were conducted as a shared visit with non-physician practitioner(s) and myself.  I personally evaluated the patient during the encounter.   Date: 04/11/2014  Rate: 111  Rhythm: sinus tachycardia  QRS Axis: normal  Intervals: PR prolonged  ST/T Wave abnormalities: nonspecific T wave changes  Conduction Disutrbances:none  Narrative Interpretation:   Old EKG Reviewed: none available  Patient presents with wheezing, cough, and fever. No prior history of asthma or COPD but he does have albuterol this is a prior medicine. No specific pneumonia noticed. Has diffuse wheezing and tachypnea. Will need multiple breathing treatments. If he does not improve will need admission, otherwise may be a candidate for outpatient management is having he remains non-hypoxic.    Ephraim Hamburger, MD 04/11/14 [phone]

## 2014-04-11 NOTE — ED Notes (Signed)
Pt ambulated in hallway on pulse ox monitor.Wheezing improved from initial contact.  O2 sats 95-97% on RA while walking approx 565ft. Hr reading as high as 147bpm. Lorre Munroe Pa-C aware. New orders received.

## 2014-04-11 NOTE — ED Provider Notes (Signed)
CSN: [id number]     Arrival date & time 04/11/14  1051 History   First MD Initiated Contact with Patient 04/11/14 1103     Chief Complaint  Patient presents with  . Wheezing     (Consider location/radiation/quality/duration/timing/severity/associated sxs/prior Treatment) HPI Comments: Patient with no known past medical history, presents to the emergency department with chief complaint of cough and wheezing x3 days. Patient states the symptoms have been progressively worsening. He has tried using an inhaler with no relief. He denies any history of asthma or COPD. Denies fevers, chills, nausea, or vomiting. Denies any chest pain or chest tightness. He states that the shortness of breath and wheezing is worsened with activity. Denies any new sick contacts. Denies any recent travel.  The history is provided by the patient. No language interpreter was used.    History reviewed. No pertinent past medical history. Past Surgical History  Procedure Laterality Date  . Knee surgery     History reviewed. No pertinent family history. History  Substance Use Topics  . Smoking status: Current Every Day Smoker -- 0.50 packs/day    Types: Cigarettes  . Smokeless tobacco: Not on file  . Alcohol Use: Yes     Comment: occasionally    Review of Systems  All other systems reviewed and are negative.     Allergies  Other  Home Medications   Prior to Admission medications   Not on File   BP 117/81  Pulse 118  Temp(Src) 100.2 F (37.9 C) (Oral)  Ht 5\' 7"  (1.702 m)  Wt 157 lb (71.215 kg)  BMI 24.58 kg/m2  SpO2 92% Physical Exam  Nursing note and vitals reviewed. Constitutional: He is oriented to person, place, and time. He appears well-developed and well-nourished.  HENT:  Head: Normocephalic and atraumatic.  Eyes: Conjunctivae and EOM are normal. Pupils are equal, round, and reactive to light. Right eye exhibits no discharge. Left eye exhibits no discharge. No scleral icterus.  Neck:  Normal range of motion. Neck supple. No JVD present.  Cardiovascular: Regular rhythm and normal heart sounds.  Exam reveals no gallop and no friction rub.   No murmur heard. Tachycardic  Pulmonary/Chest: No respiratory distress. He has wheezes. He has rales. He exhibits no tenderness.  Diffuse wheezes and rales, moderately increased work of breathing  Abdominal: Soft. He exhibits no distension and no mass. There is no tenderness. There is no rebound and no guarding.  Musculoskeletal: Normal range of motion. He exhibits no edema and no tenderness.  Neurological: He is alert and oriented to person, place, and time.  Skin: Skin is warm and dry.  Psychiatric: He has a normal mood and affect. His behavior is normal. Judgment and thought content normal.    ED Course  Procedures (including critical care time) Results for orders placed during the hospital encounter of 04/11/14  CBC WITH DIFFERENTIAL      Result Value Ref Range   WBC 5.0  4.0 - 10.5 K/uL   RBC 4.94  4.22 - 5.81 MIL/uL   Hemoglobin 16.2  13.0 - 17.0 g/dL   HCT 46.8  39.0 - 52.0 %   MCV 94.7  78.0 - 100.0 fL   MCH 32.8  26.0 - 34.0 pg   MCHC 34.6  30.0 - 36.0 g/dL   RDW 14.2  11.5 - 15.5 %   Platelets 83 (*) 150 - 400 K/uL   Neutrophils Relative % 69  43 - 77 %   Neutro Abs 3.5  1.7 -  7.7 K/uL   Lymphocytes Relative 14  12 - 46 %   Lymphs Abs 0.7  0.7 - 4.0 K/uL   Monocytes Relative 15 (*) 3 - 12 %   Monocytes Absolute 0.7  0.1 - 1.0 K/uL   Eosinophils Relative 2  0 - 5 %   Eosinophils Absolute 0.1  0.0 - 0.7 K/uL   Basophils Relative 0  0 - 1 %   Basophils Absolute 0.0  0.0 - 0.1 K/uL  BASIC METABOLIC PANEL      Result Value Ref Range   Sodium 141  137 - 147 mEq/L   Potassium 3.7  3.7 - 5.3 mEq/L   Chloride 102  96 - 112 mEq/L   CO2 24  19 - 32 mEq/L   Glucose, Bld 92  70 - 99 mg/dL   BUN 8  6 - 23 mg/dL   Creatinine, Ser 1.14  0.50 - 1.35 mg/dL   Calcium 9.1  8.4 - 10.5 mg/dL   GFR calc non Af Amer 71 (*) >90  mL/min   GFR calc Af Amer 83 (*) >90 mL/min   Anion gap 15  5 - 15  I-STAT CG4 LACTIC ACID, ED      Result Value Ref Range   Lactic Acid, Venous 1.75  0.5 - 2.2 mmol/L  I-STAT TROPOININ, ED      Result Value Ref Range   Troponin i, poc 0.00  0.00 - 0.08 ng/mL   Comment 3            Dg Chest 2 View  04/11/2014   CLINICAL DATA:  Shortness of breath.  Cough.  Chest pain.  EXAM: CHEST  2 VIEW  COMPARISON:  04/12/2013  FINDINGS: The heart size and mediastinal contours are within normal limits. Both lungs are clear. The visualized skeletal structures are unremarkable.  IMPRESSION: No active cardiopulmonary disease.   Electronically Signed   By: Earle Gell M.D.   On: 04/11/2014 12:20     Imaging Review No results found.   EKG Interpretation None    EKG: please see note by Dr. Regenia Skeeter.  MDM   Final diagnoses:  Wheezing  SOB (shortness of breath)    Patient with mild to moderate increased work of breathing, with diffuse wheezes and rales. Low-grade temperature in triage. Consider pneumonia, will get chest x-ray for further evaluation. Will treat with nebulizer for now.  Reports feeling a little better after first breathing treatment, however he is still wheezing significantly. Chest x-ray is pending.  1:10 PM Patient seen by and discussed with Dr. Regenia Skeeter, who recommends admission.  I will give the patient an hour long nebulizer treatment and then plan for admission if the patient is still wheezing, SOB, and desatting.  3:09 PM Wheezing has mostly resolved after continuous nebs.  Still has end expiratory wheezes.  Ambulates and becomes dizzy.  Discussed patient with Dr. Regenia Skeeter, who recommends admission.  Patient is unassigned.  3:58 PM Discussed with Teaching Service.  Patient does not have a PCP.  Has never heard of [facility], and asks for help in getting a PCP.   Teaching service to admit.  Tele obs.  Montine Circle, PA-C 04/11/14 [phone]

## 2014-04-12 ENCOUNTER — Encounter (HOSPITAL_COMMUNITY): Payer: Self-pay | Admitting: Internal Medicine

## 2014-04-12 DIAGNOSIS — Z87891 Personal history of nicotine dependence: Secondary | ICD-10-CM | POA: Diagnosis present

## 2014-04-12 DIAGNOSIS — D696 Thrombocytopenia, unspecified: Secondary | ICD-10-CM | POA: Diagnosis present

## 2014-04-12 LAB — CBC
HCT: 43.9 % (ref 39.0–52.0)
HCT: 43.2 % (ref 39.0–52.0)
HEMOGLOBIN: 14.7 g/dL (ref 13.0–17.0)
Hemoglobin: 14.6 g/dL (ref 13.0–17.0)
MCH: 32.5 pg (ref 26.0–34.0)
MCH: 32.6 pg (ref 26.0–34.0)
MCHC: 33.3 g/dL (ref 30.0–36.0)
MCHC: 34 g/dL (ref 30.0–36.0)
MCV: 97.8 fL (ref 78.0–100.0)
MCV: 95.8 fL (ref 78.0–100.0)
Platelets: 79 10*3/uL — ABNORMAL LOW (ref 150–400)
Platelets: 77 10*3/uL — ABNORMAL LOW (ref 150–400)
RBC: 4.49 MIL/uL (ref 4.22–5.81)
RBC: 4.51 MIL/uL (ref 4.22–5.81)
RDW: 14.7 % (ref 11.5–15.5)
RDW: 14.5 % (ref 11.5–15.5)
WBC: 6.2 10*3/uL (ref 4.0–10.5)
WBC: 10.5 10*3/uL (ref 4.0–10.5)

## 2014-04-12 LAB — BASIC METABOLIC PANEL
Anion gap: 14 (ref 5–15)
BUN: 10 mg/dL (ref 6–23)
CHLORIDE: 104 meq/L (ref 96–112)
CO2: 22 mEq/L (ref 19–32)
Calcium: 9 mg/dL (ref 8.4–10.5)
Creatinine, Ser: 1.01 mg/dL (ref 0.50–1.35)
GFR calc non Af Amer: 82 mL/min — ABNORMAL LOW (ref >90)
GLUCOSE: 139 mg/dL — AB (ref 70–99)
Potassium: 4.2 mEq/L (ref 3.7–5.3)
Sodium: 140 mEq/L (ref 137–147)

## 2014-04-12 LAB — HEPATIC FUNCTION PANEL
ALBUMIN: 3.4 g/dL — AB (ref 3.5–5.2)
ALT: 20 U/L (ref 0–53)
AST: 28 U/L (ref 0–37)
Alkaline Phosphatase: 75 U/L (ref 39–117)
Bilirubin, Direct: <0.2 mg/dL (ref 0.0–0.3)
Total Bilirubin: 0.5 mg/dL (ref 0.3–1.2)
Total Protein: 7.1 g/dL (ref 6.0–8.3)

## 2014-04-12 LAB — HIV ANTIBODY (ROUTINE TESTING W REFLEX): HIV 1&2 Ab, 4th Generation: NONREACTIVE

## 2014-04-12 MED ORDER — IPRATROPIUM-ALBUTEROL 0.5-2.5 (3) MG/3ML IN SOLN
3.0000 mL | Freq: Three times a day (TID) | RESPIRATORY_TRACT | Status: DC
  Administered 2014-04-12 – 2014-04-13 (×5): 3 mL via RESPIRATORY_TRACT
  Filled 2014-04-12 (×5): qty 3

## 2014-04-12 MED ORDER — ALBUTEROL SULFATE (2.5 MG/3ML) 0.083% IN NEBU: 2.5000 mg | INHALATION_SOLUTION | Freq: Three times a day (TID) | RESPIRATORY_TRACT | Status: DC

## 2014-04-12 MED ORDER — ALBUTEROL SULFATE (2.5 MG/3ML) 0.083% IN NEBU
2.5000 mg | INHALATION_SOLUTION | RESPIRATORY_TRACT | Status: DC | PRN
  Administered 2014-04-12: 2.5 mg via RESPIRATORY_TRACT

## 2014-04-12 MED ORDER — ZOLPIDEM TARTRATE 5 MG PO TABS
5.0000 mg | ORAL_TABLET | Freq: Once | ORAL | Status: AC
  Administered 2014-04-12: 5 mg via ORAL
  Filled 2014-04-12: qty 1

## 2014-04-12 MED ORDER — METHYLPREDNISOLONE SODIUM SUCC 125 MG IJ SOLR
60.0000 mg | Freq: Once | INTRAMUSCULAR | Status: AC
  Filled 2014-04-12: qty 0.96

## 2014-04-12 MED ORDER — METHYLPREDNISOLONE SODIUM SUCC 125 MG IJ SOLR
60.0000 mg | Freq: Two times a day (BID) | INTRAMUSCULAR | Status: DC
Start: 2014-04-12 — End: 2014-04-12
  Administered 2014-04-12: 60 mg via INTRAVENOUS
  Filled 2014-04-12: qty 2
  Filled 2014-04-12 (×2): qty 0.96

## 2014-04-12 MED ORDER — PANTOPRAZOLE SODIUM 40 MG PO TBEC
40.0000 mg | DELAYED_RELEASE_TABLET | Freq: Every day | ORAL | Status: DC
  Administered 2014-04-12 – 2014-04-13 (×2): 40 mg via ORAL
  Filled 2014-04-12 (×2): qty 1

## 2014-04-12 MED ORDER — METHYLPREDNISOLONE SODIUM SUCC 125 MG IJ SOLR
60.0000 mg | Freq: Two times a day (BID) | INTRAMUSCULAR | Status: DC
  Administered 2014-04-12 – 2014-04-13 (×3): 60 mg via INTRAVENOUS
  Filled 2014-04-12 (×4): qty 0.96

## 2014-04-12 MED ORDER — IPRATROPIUM BROMIDE 0.02 % IN SOLN: 0.5000 mg | Freq: Three times a day (TID) | RESPIRATORY_TRACT | Status: DC

## 2014-04-12 MED ORDER — DEXTROMETHORPHAN POLISTIREX 30 MG/5ML PO LQCR
15.0000 mg | Freq: Once | ORAL | Status: AC
  Administered 2014-04-12: 15 mg via ORAL
  Filled 2014-04-12: qty 5

## 2014-04-12 MED ORDER — ALBUTEROL SULFATE (2.5 MG/3ML) 0.083% IN NEBU
INHALATION_SOLUTION | RESPIRATORY_TRACT | Status: AC
  Filled 2014-04-12: qty 3

## 2014-04-12 NOTE — Progress Notes (Signed)
Subjective:  Received 1x albuterol, 1x tussionex for cough. Feels that cough is better and breathing is better. However, still wheezing. Denies cp/fever/chills/n/v/diarrhea.  Objective: Vital signs in last 24 hours: Filed Vitals:   04/12/14 0438 04/12/14 0555 04/12/14 0733 04/12/14 0817  BP:  120/62    Pulse:  117    Temp:  98.8 F (37.1 C)    TempSrc:  Oral    Resp:  18    Height:      Weight:      SpO2: 99% 95% 96% 93%   Weight change:   Intake/Output Summary (Last 24 hours) at 04/12/14 0924 Last data filed at 04/11/14 1415  Gross per 24 hour  Intake   1000 ml  Output      0 ml  Net   1000 ml   Vitals reviewed. General: resting in bed, NAD, cooperative. HEENT: PERRL, EOMI, no scleral icterus Cardiac: RRR, no rubs, murmurs or gallops Pulm: no accessory muscle use. Has expiratory wheezing diffusely.  Abd: soft, nontender, nondistended, BS present Ext: warm and well perfused, no pedal edema Neuro: alert and oriented X3, cranial nerves II-XII grossly intact, strength and sensation to light touch equal in bilateral upper and lower extremities.  Lab Results: Basic Metabolic Panel:  Recent Labs Lab 04/11/14 1142 04/12/14 0525  NA 141 140  K 3.7 4.2  CL 102 104  CO2 24 22  GLUCOSE 92 139*  BUN 8 10  CREATININE 1.14 1.01  CALCIUM 9.1 9.0   Liver Function Tests: No results found for this basename: AST, ALT, ALKPHOS, BILITOT, PROT, ALBUMIN,  in the last 168 hours No results found for this basename: LIPASE, AMYLASE,  in the last 168 hours No results found for this basename: AMMONIA,  in the last 168 hours CBC:  Recent Labs Lab 04/11/14 1142 04/12/14 0525  WBC 5.0 6.2  NEUTROABS 3.5  --   HGB 16.2 14.6  HCT 46.8 43.9  MCV 94.7 97.8  PLT 83* 79*   Cardiac Enzymes: No results found for this basename: CKTOTAL, CKMB, CKMBINDEX, TROPONINI,  in the last 168 hours BNP: No results found for this basename: PROBNP,  in the last 168 hours D-Dimer: No results  found for this basename: DDIMER,  in the last 168 hours CBG: No results found for this basename: GLUCAP,  in the last 168 hours Hemoglobin A1C: No results found for this basename: HGBA1C,  in the last 168 hours Fasting Lipid Panel: No results found for this basename: CHOL, HDL, LDLCALC, TRIG, CHOLHDL, LDLDIRECT,  in the last 168 hours Thyroid Function Tests: No results found for this basename: TSH, T4TOTAL, FREET4, T3FREE, THYROIDAB,  in the last 168 hours Coagulation: No results found for this basename: LABPROT, INR,  in the last 168 hours Anemia Panel: No results found for this basename: VITAMINB12, FOLATE, FERRITIN, TIBC, IRON, RETICCTPCT,  in the last 168 hours Urine Drug Screen: Drugs of Abuse  No results found for this basename: labopia, cocainscrnur, labbenz, amphetmu, thcu, labbarb    Alcohol Level: No results found for this basename: ETH,  in the last 168 hours Urinalysis: No results found for this basename: COLORURINE, APPERANCEUR, LABSPEC, PHURINE, GLUCOSEU, HGBUR, BILIRUBINUR, KETONESUR, PROTEINUR, UROBILINOGEN, NITRITE, LEUKOCYTESUR,  in the last 168 hours Misc. Labs:   Micro Results: No results found for this or any previous visit (from the past 240 hour(s)). Studies/Results: Dg Chest 2 View  04/11/2014   CLINICAL DATA:  Shortness of breath.  Cough.  Chest pain.  EXAM:  CHEST  2 VIEW  COMPARISON:  04/12/2013  FINDINGS: The heart size and mediastinal contours are within normal limits. Both lungs are clear. The visualized skeletal structures are unremarkable.  IMPRESSION: No active cardiopulmonary disease.   Electronically Signed   By: Earle Gell M.D.   On: 04/11/2014 12:20   Medications: I have reviewed the patient's current medications. Scheduled Meds: . albuterol      . doxycycline  100 mg Oral Q12H  . guaiFENesin  600 mg Oral BID  . heparin  5,000 Units Subcutaneous 3 times per day  . ipratropium-albuterol  3 mL Nebulization TID  . nicotine  14 mg Transdermal  Daily  . predniSONE  20 mg Oral Q breakfast  . sodium chloride  3 mL Intravenous Q12H  . sodium chloride  3 mL Intravenous Q12H   Continuous Infusions:  PRN Meds:.sodium chloride, acetaminophen, acetaminophen, albuterol, chlorpheniramine-HYDROcodone, sodium chloride Assessment/Plan: Principal Problem:   COPD with acute exacerbation Active Problems:   Acute bronchitis   Thrombocytopenia, unspecified   Tobacco use disorder  SOB 2/2 to acute on chronic bronchitis  -patient is a smoker, hasn't seen a PCP for a long time, is having exp wheezing, productive cough, and chest tightness. Don't have PFT's to confirm COPD. Never had dx of COPD. EKG shows sinus tachy. Afebrile, no WBC count. CXR is normal.  - continues to have wheezing today but improving.  - continue duoneb q6hr. Got solumedrol in ED. Will continue solumedrol now. Also on doxycycline.  - mucinex+codeine for cough during the day, tussionex for night.  - cont pulse ox and check ambulatory pulse - SW consulted to f/up at wellness clinic. (missed appointment there before).  - will need PFT's outpatient.  - nicotine for smoking cessation.   Tachycardia -110's - sinus tach. - could be 2/2 to respiratory distress or nebulizers - repeat orthostats to make sure he is not dry. 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 was being [facility] at [facility] at with pegasys, ribavirin and telaprevir.  Was lost to f/up. Had hepatocarcinoma screening MRI on 11/11/2011. Didn't get his f/up 6 mo MRI. Didn't get f/up EGD. Based on 01/18/2012 notes, he was supposed to get 48 week of treatment starting then because he did not follow up for routine viral load checks.Hep C RNA not detected on 04/11/2012.   Diet: regular  Code: full  Heparin for DVT ppx.  Dispo: Disposition is deferred at this time, awaiting improvement of current medical problems.  Anticipated discharge in approximately 2-3 day(s).   The patient does have a  current PCP Rogers Blocker, MD) and does need an [facility] hospital follow-up appointment after discharge.  The patient does not know have transportation limitations that hinder transportation to clinic appointments.  .Services Needed at time of discharge: Y = Yes, Blank = No PT:   OT:   RN:   Equipment:   Other:     LOS: 1 day   Dellia Nims, MD 04/12/2014, 9:24 AM

## 2014-04-12 NOTE — Progress Notes (Signed)
Notified MD oncall pt requesting medication for cough. MD stated she would put in order. Will continue to monitor pt. Ranelle Oyster, RN

## 2014-04-12 NOTE — Progress Notes (Signed)
Ordered a Fluttter Valve to facilitate movement of secretions.  RT to continue to monitor.

## 2014-04-12 NOTE — H&P (Addendum)
Internal Medicine On-Call Attending Admission Note Date: 04/12/2014  Patient name: Dylan [name] Medical record number: [id number] Date of birth: [birth year] Age: 53 y.o. Gender: male  I saw and evaluated the patient. I reviewed the resident's note and I agree with the resident's findings and plan as documented in the resident's note, with the following additional comments.  Chief Complaint(s): Shortness of breath, wheezing, cough  History - key components related to admission: Patient is a 53 year old man with history of hepatitis C, cirrhosis, grade 1 distal esophageal varices by EGD 03/29/2010, 40 year history of tobacco use, and other problems as outlined in the medical history, admitted with a three-day history of shortness of breath, wheezing, and cough productive of yellow sputum.  Patient reports occasional wheezing over the past year, and says that he was previously prescribed an albuterol inhaler when he was a patient at [facility].  He reports recent chills.  He denies any recent sore throat or cold symptoms; he also denies any sick exposures.  Physical Exam - key components related to admission:  Filed Vitals:   04/12/14 0438 04/12/14 0555 04/12/14 0733 04/12/14 0817  BP:  120/62    Pulse:  117    Temp:  98.8 F (37.1 C)    TempSrc:  Oral    Resp:  18    Height:      Weight:      SpO2: 99% 95% 96% 93%    General: Alert, no distress Lungs: Diffuse coarse expiratory wheezing Heart: Regular; S1-S2, no S3, no S4, no murmurs Abdomen: Bowel sounds present, soft, nontender; no hepatosplenomegaly Extremities: No edema  Lab results:   Basic Metabolic Panel:  Recent Labs  04/11/14 1142 04/12/14 0525  NA 141 140  K 3.7 4.2  CL 102 104  CO2 24 22  GLUCOSE 92 139*  BUN 8 10  CREATININE 1.14 1.01  CALCIUM 9.1 9.0     CBC:  Recent Labs  04/11/14 1142 04/12/14 0525  WBC 5.0 6.2  HGB 16.2 14.6  HCT 46.8 43.9  MCV 94.7 97.8  PLT 83* 79*   Lab Results   Component Value Date   PLT 79* 04/12/2014   PLT 83* 04/11/2014   PLT 24* 01/09/2012   PLT 38* 05/05/2011   PLT 69* 03/24/2011   PLT 79* 08/25/2008   PLT 81* 08/14/2008       Recent Labs  04/11/14 1142  NEUTROABS 3.5  LYMPHSABS 0.7  MONOABS 0.7  EOSABS 0.1  BASOSABS 0.0      Imaging results:  Dg Chest 2 View  04/11/2014   CLINICAL DATA:  Shortness of breath.  Cough.  Chest pain.  EXAM: CHEST  2 VIEW  COMPARISON:  04/12/2013  FINDINGS: The heart size and mediastinal contours are within normal limits. Both lungs are clear. The visualized skeletal structures are unremarkable.  IMPRESSION: No active cardiopulmonary disease.   Electronically Signed   By: Earle Gell M.D.   On: 04/11/2014 12:20    Other results: EKG: Sinus tachycardia; left anterior fascicular block   Assessment & Plan by Problem:  1.  Probable COPD with acute exacerbation.  Patient has a long smoking history, and also a history of episodic wheezing for more than a year which has responded in the past to the outpatient treatment with an albuterol inhaler.  He has apparently not had any pulmonary function testing.  Presentation is consistent with acute exacerbation of COPD.  Patient reports some improvement in his breathing following initial treatment  with steroids and inhaled bronchodilators.  Plan is continue IV steroids; inhaled bronchodilators with pre-and post peak flows; empiric doxycycline; follow oxygen saturations and supplement as indicated; patient will need transition to an inhaler regimen prior to discharge and will need outpatient followup for pulmonary function studies.  As below, smoking cessation is essential.  2.  Tobacco use.  Patient reports good response to nicotine patch and says that he would like to stop smoking.  Plan is continue nicotine patch with taper; would provide information on resources including [employer] Quitline brochure.  3.  Thrombocytopenia.  Patient has a history of thrombocytopenia; he also  has a history of cirrhosis but no splenomegaly on last MRI done 11/11/2011.  He will need outpatient followup for this.  4.  Hepatitis C with cirrhosis.  Patient was previously treated both locally and at [facility] by Dr. Patsy Baltimore, [facility] hepatologist.  He will need followup for his hepatitis C as an outpatient.  5.  Other problems and plans as per the resident physician's note.

## 2014-04-12 NOTE — Plan of Care (Signed)
Problem: Phase I Progression Outcomes Goal: Discharge plan established Outcome: Progressing Continuing on Solumedrol

## 2014-04-12 NOTE — Progress Notes (Signed)
Pre Peak Flow 120 Post Peak Flow 180 Good patient effort

## 2014-04-12 NOTE — Progress Notes (Signed)
Internal Medicine Attending  Date: 04/12/2014  Patient name: Dylan [name] Medical record number: [id number] Date of birth: October 28, 1960 Age: 53 y.o. Gender: male  See attending H&P note.

## 2014-04-13 DIAGNOSIS — B1920 Unspecified viral hepatitis C without hepatic coma: Secondary | ICD-10-CM

## 2014-04-13 DIAGNOSIS — F17200 Nicotine dependence, unspecified, uncomplicated: Secondary | ICD-10-CM

## 2014-04-13 DIAGNOSIS — D696 Thrombocytopenia, unspecified: Secondary | ICD-10-CM

## 2014-04-13 DIAGNOSIS — K7460 Unspecified cirrhosis of liver: Secondary | ICD-10-CM

## 2014-04-13 MED ORDER — MOMETASONE FURO-FORMOTEROL FUM 200-5 MCG/ACT IN AERO
2.0000 | INHALATION_SPRAY | Freq: Two times a day (BID) | RESPIRATORY_TRACT | Status: DC
Start: 2014-04-13 — End: 2016-01-07

## 2014-04-13 MED ORDER — LORATADINE 10 MG PO TABS: 10.0000 mg | ORAL_TABLET | Freq: Every day | ORAL | Status: DC

## 2014-04-13 MED ORDER — PREDNISONE 10 MG PO TABS: ORAL_TABLET | ORAL | Status: DC

## 2014-04-13 MED ORDER — ALBUTEROL SULFATE (2.5 MG/3ML) 0.083% IN NEBU: 2.5000 mg | INHALATION_SOLUTION | RESPIRATORY_TRACT | Status: DC | PRN

## 2014-04-13 MED ORDER — PANTOPRAZOLE SODIUM 40 MG PO TBEC: 40.0000 mg | DELAYED_RELEASE_TABLET | Freq: Every day | ORAL | Status: DC

## 2014-04-13 MED ORDER — NICOTINE 14 MG/24HR TD PT24: 14.0000 mg | MEDICATED_PATCH | Freq: Every day | TRANSDERMAL | Status: DC

## 2014-04-13 MED ORDER — DOXYCYCLINE HYCLATE 100 MG PO TABS: 100.0000 mg | ORAL_TABLET | Freq: Two times a day (BID) | ORAL | Status: DC

## 2014-04-13 MED ORDER — GUAIFENESIN ER 600 MG PO TB12: 600.0000 mg | ORAL_TABLET | Freq: Two times a day (BID) | ORAL | Status: DC

## 2014-04-13 MED ORDER — LORATADINE 10 MG PO TABS
10.0000 mg | ORAL_TABLET | Freq: Every day | ORAL | Status: DC
Start: 2014-04-13 — End: 2014-04-13
  Administered 2014-04-13: 10 mg via ORAL
  Filled 2014-04-13: qty 1

## 2014-04-13 MED ORDER — MOMETASONE FURO-FORMOTEROL FUM 200-5 MCG/ACT IN AERO
2.0000 | INHALATION_SPRAY | Freq: Two times a day (BID) | RESPIRATORY_TRACT | Status: DC
  Administered 2014-04-13: 2 via RESPIRATORY_TRACT
  Filled 2014-04-13: qty 8.8

## 2014-04-13 MED ORDER — ALBUTEROL SULFATE HFA 108 (90 BASE) MCG/ACT IN AERS: 2.0000 | INHALATION_SPRAY | Freq: Four times a day (QID) | RESPIRATORY_TRACT | Status: DC | PRN

## 2014-04-13 NOTE — Care Management Note (Signed)
    Page 1 of 1   04/13/2014     5:11:22 PM CARE MANAGEMENT NOTE 04/13/2014  Patient:  MARKANTHONY, GEDNEY   Account Number:  [id number]  Date Initiated:  04/13/2014  Documentation initiated by:  [facility]  Subjective/Objective Assessment:   adm: Shortness of breath, wheezing, cough     Action/Plan:   discharge planning   Anticipated DC Date:  04/13/2014   Anticipated DC Plan:        [place]  CM consult  [place] [facility]      Choice offered to / List presented to:             Status of service:  Completed, signed off Medicare Important Message given?   (If response is "NO", the following Medicare IM given date fields will be blank) Date Medicare IM given:   Medicare IM given by:   Date Additional Medicare IM given:   Additional Medicare IM given by:    Discharge Disposition:  HOME/SELF CARE  Per UR Regulation:    If discussed at Long Length of Stay Meetings, dates discussed:    Comments:  04/13/14 16:30 CM met pt (sleeping) in room with significant other, Arbie Cookey.  carol states she is a pt at the [facility] and understands to folllow up with pt getting an appt upon discharge.  [employer] letter given and understanding was verbalized each prescription would be $3 dollars.  Pt uses [facility] on [address] (which is on the participating pharmacy list).  No other CM needs were communicated.  Unit Secretary to give RN message of aforementioned.  Mariane Masters, BSN, CM [phone].

## 2014-04-13 NOTE — Progress Notes (Signed)
Subjective: No overnight events. He continues to have wheezing. His oxygen saturation is wnl. Denies cough, fever/chills, chest pain.   Objective: Vital signs in last 24 hours: Filed Vitals:   04/12/14 1356 04/12/14 1932 04/12/14 2023 04/13/14 0443  BP: 142/76  150/78 148/90  Pulse: 111  123 94  Temp: 98.5 F (36.9 C)  98.2 F (36.8 C) 98.5 F (36.9 C)  TempSrc: Oral  Oral Oral  Resp: 18  18 18   Height:      Weight:      SpO2: 95% 96% 94% 97%   Weight change:   Intake/Output Summary (Last 24 hours) at 04/13/14 0823 Last data filed at 04/12/14 0900  Gross per 24 hour  Intake    240 ml  Output      0 ml  Net    240 ml   Vitals reviewed.  General: pleasant, resting in bed, in NAD.  HEENT:  no scleral icterus  Cardiac: mild tachycardia, no rubs, murmurs or gallops  Pulm: no accessory muscle use. Has expiratory wheezing diffusely.  Abd: soft, nontender, nondistended, BS present  Ext: warm and well perfused, no pedal edema  Neuro: alert and oriented X3, moves all extremities voluntairly   Lab Results: Basic Metabolic Panel:  Recent Labs Lab 04/11/14 1142 04/12/14 0525  NA 141 140  K 3.7 4.2  CL 102 104  CO2 24 22  GLUCOSE 92 139*  BUN 8 10  CREATININE 1.14 1.01  CALCIUM 9.1 9.0   Liver Function Tests:  Recent Labs Lab 04/12/14 0910  AST 28  ALT 20  ALKPHOS 75  BILITOT 0.5  PROT 7.1  ALBUMIN 3.4*   CBC:  Recent Labs Lab 04/11/14 1142 04/12/14 0525 04/12/14 1050  WBC 5.0 6.2 10.5  NEUTROABS 3.5  --   --   HGB 16.2 14.6 14.7  HCT 46.8 43.9 43.2  MCV 94.7 97.8 95.8  PLT 83* 79* 77*     Studies/Results: Dg Chest 2 View  04/11/2014   CLINICAL DATA:  Shortness of breath.  Cough.  Chest pain.  EXAM: CHEST  2 VIEW  COMPARISON:  04/12/2013  FINDINGS: The heart size and mediastinal contours are within normal limits. Both lungs are clear. The visualized skeletal structures are unremarkable.  IMPRESSION: No active cardiopulmonary disease.    Electronically Signed   By: Earle Gell M.D.   On: 04/11/2014 12:20   Medications: I have reviewed the patient's current medications. Scheduled Meds: . doxycycline  100 mg Oral Q12H  . guaiFENesin  600 mg Oral BID  . heparin  5,000 Units Subcutaneous 3 times per day  . ipratropium-albuterol  3 mL Nebulization TID  . methylPREDNISolone (SOLU-MEDROL) injection  60 mg Intravenous Q12H  . nicotine  14 mg Transdermal Daily  . pantoprazole  40 mg Oral Daily  . sodium chloride  3 mL Intravenous Q12H  . sodium chloride  3 mL Intravenous Q12H   Continuous Infusions:  PRN Meds:.sodium chloride, acetaminophen, acetaminophen, albuterol, chlorpheniramine-HYDROcodone, sodium chloride Assessment/Plan: 53 year old man with PMH of hepatitis C s/p treatment, 40 pack-year smoking history, with no medical follow up presenting with bronchitis and wheezing suspicious for COPD exacerbation  Acute on chronic bronchitis:  He is a smoker and hasn't seen a PCP for a long time, is having exp wheezing, productive cough, and chest tightness. No PFTs are available to confirm COPD but this diagnosis is likely. EKG shows sinus tachy. Afebrile, no WBC count. CXR is with no PNA. He was  wheezing today. He ambulated yesterday with no oxygen desaturation on RA. Duonebs with Pre peak flow of 120, post of 180. - Continue duoneb q6hr with peak flow pre and post treatment - Continue solumedrol IV 60mg  today due to persistent diffuse wheezing, may transition to prednisone taper tomorrow - Continue doxycycline 100mg  BID - Mucinex for cough -Tussionex for worse cough at night - Start Dulera - Start Claritin 10mg  daily - Continuous pulse ox - SW consulted to f/up at wellness clinic. (missed appointment there before).  - will need PFT's outpatient.  - nicotine patch - smoking cessation counseling - flutter valve for pulmonary toilet  Tachycardia -Sinus tachycardia per EKG with HR in 100s, likely due to albuterol nebulizer.  Repeat orthostatic is negative.  -Continue monitoring  Hep C - genotype 1 hepatitis C, IL28B CT. was being treated at [facility] at with pegasys, ribavirin and telaprevir. Was lost to f/up. Had hepatocarcinoma screening MRI on 11/11/2011. Didn't get his f/up 6 mo MRI. Didn't get f/up EGD. Based on 01/18/2012 notes, he was supposed to get 48 week of treatment starting then because he did not follow up for routine viral load checks.Hep C RNA not detected on 04/11/2012. LFTs wnl.  Diet: regular   Code: full    Heparin for DVT ppx  Dispo: Disposition is deferred at this time, awaiting improvement of current medical problems. Anticipated discharge in approximately 2-3 day(s).   The patient does have a current PCP Rogers Blocker, MD) and does need an [facility] hospital follow-up appointment after discharge.   The patient does not know have transportation limitations that hinder transportation to clinic appointments.   Services Needed at time of discharge: Y = Yes, Blank = No PT:   OT:   RN:   Equipment:   Other:     LOS: 2 days   Blain Pais, MD 04/13/2014, 8:23 AM

## 2014-04-13 NOTE — Progress Notes (Signed)
Patient oxygen saturation 96% on room air immediately after ambulating on unit.  Continue with plan of care

## 2014-04-13 NOTE — Progress Notes (Signed)
Pt given discharge packet. RN reviewed information with patient and spouse. Pt had questions related to prescriptions MD on call paged. Inhaler was given to patient. IV and telemetry was removed. Pt refused to be wheelchaired downstairs.

## 2014-04-13 NOTE — Discharge Summary (Signed)
Name: Dylan [name] MRN: [id number] DOB: 09-07-1960 53 y.o. PCP: Rogers Blocker, MD  Date of Admission: 04/11/2014 11:03 AM Date of Discharge: 04/13/2014 Attending Physician: Axel Filler, MD  Discharge Diagnosis: Principal Problem:   COPD with acute exacerbation Active Problems:   Wheezing   Acute bronchitis   Thrombocytopenia, unspecified   Tobacco use disorder  Discharge Medications:   Medication List         albuterol 108 (90 BASE) MCG/ACT inhaler  Commonly known as:  PROVENTIL HFA;VENTOLIN HFA  Inhale 2 puffs into the lungs every 6 (six) hours as needed for wheezing or shortness of breath.     albuterol (2.5 MG/3ML) 0.083% nebulizer solution  Commonly known as:  PROVENTIL  Take 3 mLs (2.5 mg total) by nebulization every 4 (four) hours as needed for wheezing or shortness of breath.     doxycycline 100 MG tablet  Commonly known as:  VIBRA-TABS  Take 1 tablet (100 mg total) by mouth every 12 (twelve) hours.     guaiFENesin 600 MG 12 hr tablet  Commonly known as:  MUCINEX  Take 1 tablet (600 mg total) by mouth 2 (two) times daily.     loratadine 10 MG tablet  Commonly known as:  CLARITIN  Take 1 tablet (10 mg total) by mouth daily.     mometasone-formoterol 200-5 MCG/ACT Aero  Commonly known as:  DULERA  Inhale 2 puffs into the lungs 2 (two) times daily.     nicotine 14 mg/24hr patch  Commonly known as:  NICODERM CQ - dosed in mg/24 hours  Place 1 patch (14 mg total) onto the skin daily.     pantoprazole 40 MG tablet  Commonly known as:  PROTONIX  Take 1 tablet (40 mg total) by mouth daily.     predniSONE 10 MG tablet  Commonly known as:  DELTASONE  Take 4 tablets daily for 2 days, then 3 tablets daily for 2 days, then 2 tablets daily for 2 days, then 1 tablet daily for 2 days then stop.        Disposition and follow-up:   [name] was discharged from [facility] in Good condition.  At the hospital follow up visit  please address:  1. -Refer patient to financial Counselor for [employer] or [employer] application.      -He needs PFTs, continue smoking cessation counseling (he is using nicotine patches 14mg  daily).      -Refer to hepatitis C clinic to confirm full treatment and to GI for repeat EGD that he missed.   2.  Labs / imaging needed at time of follow-up:  -CBC for monitoring of thrombocytopenia, platelets at 77 on discharge with no s/s of bleeding.  -Consider ordering MRI for [facility] screening (he was supposed to get this annually with last one in 2013)  3.  Pending labs/ test needing follow-up: None  Follow-up Appointments:     Follow-up Information   Schedule an appointment as soon as possible for a visit with [place]    . (you need to be seen within one week. )    Contact information:   [place] [phone]      Discharge Instructions: Discharge Instructions   Diet - low sodium heart healthy    Complete by:  As directed      Increase activity slowly    Complete by:  As directed  Consultations:  None  Procedures Performed:  Dg Chest 2 View  04/11/2014   CLINICAL DATA:  Shortness of breath.  Cough.  Chest pain.  EXAM: CHEST  2 VIEW  COMPARISON:  04/12/2013  FINDINGS: The heart size and mediastinal contours are within normal limits. Both lungs are clear. The visualized skeletal structures are unremarkable.  IMPRESSION: No active cardiopulmonary disease.   Electronically Signed   By: Earle Gell M.D.   On: 04/11/2014 12:20   Admission HPI:  53 yo without any reported PMH, 1PPDx40 years smoker comes in with 3 days of wheezing, shortness of breath, cough with yellow sputum production, and chest tightness. He has tried someone else's inhaler without any relief at home. Denies hx of copd or asthma. Denies fever/chills/n/v/diarrhea/dysuria. Has chest tightness and wheezing. Denies chest pain. Denies any  leg swelling or face swelling. Denies sick contacts and recent travel.  Got 1x solumedrol in the ED and albuterol+atrovent treatment. Feeling somewhat better but still wheezing currently.   Hospital Course by problem list: Acute on chronic bronchitis: He is a smoker and has not seen a PCP for a long time. On physical exam he had wheezing, cough productive of white sputum, and chest tightness. No PFTs are available to confirm COPD but this diagnosis is very likely. EKG showed sinus tachy with negative troponin, making ACS unlikely. He remained afebrile with no leucocytosis. His CXR was negative for pneumonia or vascular congestion.  He was treated with scheduled DuoNebs, doxycycline, Mucinex and Tussionex for cough, flutter valve for pulmonary toilet, and solumedrol. He ambulated with no oxygen desaturation on RA. Duonebs with Pre peak flow of 120, post of 180 that improved to 250 post treatment on the day of his discharge. He was started on [facility] on the day of his discharge. He will continue steroid treatment with a taper of prednisone. He will also continue to use his albuterol nebulizing treatments at home (he already had nebulizer machine). He is very interested in quitting smoking and was started on nicotine patches. He will follow up with his new PCP for ongoing monitoring and for PFTs.   Tobacco smoking: He has a 40-pack-year smoking history. He is very interested in quitting smoking as has actually started cutting back to 1/2 pack per day. He was started on nicotine patches 14mg  daily and will continue to use this after his discharge. He was given information about the 1-800-QUIT-NOW line for additional help quitting smoking.   Tachycardia -Sinus tachycardia per EKG with HR in 100s, likely due to albuterol nebulizer. Orthostatic vitals were negative for hypotension. His tachycardia improved with decreased frequency of albuterol nebulizer treatment.   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 He was being treated at [facility] with pegasys, ribavirin and telaprevir. Was lost to follow up. Had hepatocarcinoma screening MRI on 11/11/2011. He did not get his follow up 6 mo MRI or his 6 month follow-up EGD for evaluation of possible esophageal varices. Based on 01/18/2012 notes, he was supposed to get 48 week of treatment starting then because he did not follow up for routine viral load checks.Hep C RNA not detected on 04/11/2012.  His LFTs are within normal but he has thrombocytopenia with platelets in the 70-80 range. He will follow up with a new PCP at the [facility] and will need referral the the Hep C clinic and to GI in addition to repeat MRI for [facility] evaluation.   Thrombocytopenia : Unclear etiology. He  has had platelets <100 dating back to 2009. Platelets at of 83-79 during this hospitalization. Etiology could be secondary to acute illness, liver disease from his cirrhosis (though his LFTs were wnl), or familial thrombocytopenia. MRI in 2013 with no splenomegaly. He denies history of bleeding. He will follow up with his new PCP for repeat CBC and monitoring. He may need referral to Hematology if his platelets do not improve.   Discharge Vitals:   BP 130/86  Pulse 105  Temp(Src) 98.5 F (36.9 C) (Oral)  Resp 18  Ht 5\' 7"  (1.702 m)  Wt 156 lb (70.761 kg)  BMI 24.43 kg/m2  SpO2 96%  Discharge Labs:  Lab Results:  Basic Metabolic Panel:   Recent Labs  Lab  04/11/14 1142  04/12/14 0525   NA  141  140   K  3.7  4.2   CL  102  104   CO2  24  22   GLUCOSE  92  139*   BUN  8  10   CREATININE  1.14  1.01   CALCIUM  9.1  9.0    Liver Function Tests:   Recent Labs  Lab  04/12/14 0910   AST  28   ALT  20   ALKPHOS  75   BILITOT  0.5   PROT  7.1   ALBUMIN  3.4*    CBC:   Recent Labs  Lab  04/11/14 1142  04/12/14 0525  04/12/14 1050   WBC  5.0  6.2  10.5   NEUTROABS  3.5  --  --   HGB  16.2  14.6  14.7   HCT  46.8  43.9  43.2   MCV   94.7  97.8  95.8   PLT  83*  79*  77*    Signed: Blain Pais, MD 04/13/2014, 7:24 PM    Services Ordered on Discharge: None Equipment Ordered on Discharge: None

## 2014-04-13 NOTE — Discharge Instructions (Signed)
It was a pleasure taking care of you. You were hospitalized and treated for bronchitis.  It is very important that you continue taking the antibiotic, the prednisone and the inhaler Dylan [name]).  You need to follow up with your new PCP within one week.  You may call 1-800-QUIT-NOW for free nicotine patches.    Emergency Department Resource Guide 1) Find a Doctor and Pay Out of Pocket Although you won't have to find out who is covered by your insurance plan, it is a good idea to ask around and get recommendations. You will then need to call the office and see if the doctor you have chosen will accept you as a new patient and what types of options they offer for patients who are self-pay. Some doctors offer discounts or will set up payment plans for their patients who do not have insurance, but you will need to ask so you aren't surprised when you get to your appointment.  2) Contact Your Local Health Department Not all health departments have doctors that can see patients for sick visits, but many do, so it is worth a call to see if yours does. If you don't know where your local health department is, you can check in your phone book. The CDC also has a tool to help you locate your state's health department, and many state websites also have listings of all of their local health departments.  3) Find a [facility] [place] [facility] If your illness is not likely to be very severe or complicated, you may want to try a walk in clinic. These are popping up all over the country in pharmacies, drugstores, and shopping centers. They're usually staffed by nurse practitioners or physician assistants that have been trained to treat common illnesses and complaints. They're usually fairly quick and inexpensive. However, if you have serious medical issues or chronic medical problems, these are probably not your best option.  No Primary Care Doctor: - Call Health Connect at  [phone] - they can help you locate a primary care doctor  that  accepts your insurance, provides certain services, etc. - Physician Referral Service- [phone]  Chronic Pain Problems: Organization         Address  Phone   Notes  [facility]  [phone] Patients need to be referred by their primary care doctor.   Medication Assistance: Organization         Address  Phone   Notes  [facility] Medication [facility] [address] [place], [place] [address] [phone] --Must be a resident of [facility] -- Must have NO insurance coverage whatsoever (no Medicaid/ Medicare, etc.) -- The pt. MUST have a primary care doctor that directs their care regularly and follows them in the community   [facility]ist  [phone]   [employer]  [phone]    Agencies that provide inexpensive medical care: Organization         Address  Phone   Notes  [place]  [phone]   [facility]    [phone]   [facility] [place], [place] [address] [phone]   [place] [address] [place] [phone]   Planned Parenthood    [phone]   [facility]    [phone]   [place] and [place], [place] Phone:  [phone], Fax:  [phone]  Hours of Operation:  9 am - 6 pm, M-F.  Also accepts Medicaid/Medicare and self-pay.  [facility] for [place], [address], [place] Phone: [phone], Fax: [phone]. Hours of Operation:  8:30 am - 5:30 pm, M-F.  Also accepts Medicaid and self-pay.  [facility] [address] [place] Phone: [phone]   [place], [place], [place] [phone], Ext. [phone] Mondays & Thursdays: 7-9 AM.  First 15 patients are seen on a first come, first serve basis.    [place] Providers:  Organization          Address  Phone   Notes  [facility] [address],  [phone] Also accepts self-pay patients.  [facility] [address], [place]  [phone]   [place], [address], [place] [phone]   [facility] [address] [place] [phone]   Lucianne Lei [address], [place]   [phone] Only accepts [place] Access [place] patients after they have their name applied to their card.   Self-Pay (no insurance) in [facility]:  Organization         Address  Phone   Notes  Sickle Cell Patients, [facility] [place] [phone]   [facility] [place] [phone]   [facility] [place]  [place], [place], [place] [phone]   [facility]/Dr. Osei-Bonsu  [address] [place] or [address], [place] [phone] Phone number for both [place] and [place] locations is the same.  [facility]gent [facility] and [facility] [address] [place] [phone]   [facility] [address], [place] or [address] [phone]   [facility] [address], [place] [phone], phone; [phone], fax Sees patients 1st and 3rd Saturday of every month.  Must not qualify for public or private insurance (i.e. Medicaid, Medicare, [place] Health Choice, Veterans' Benefits)  Household income should be no more than 200% of the poverty level The clinic cannot treat you if you are pregnant or think you are pregnant  Sexually transmitted diseases are not treated at the clinic.    Dental Care: Organization         Address  Phone  Notes  [facility] Department of [facility] [phone] Accepts children up to age 59 who are enrolled in [place] or [place]; pregnant women with a Medicaid card; and children who have applied for Medicaid or [place] Health Choice, but were declined, whose parents can pay a reduced fee at time of service.  [facility]  [address], [place] [phone] Accepts children up to age 45 who are enrolled in [place] or [place]; pregnant women with a Medicaid card; and children who have applied for Medicaid or [place] Health Choice, but were declined, whose parents can pay a reduced fee at time of service.  [facility] Adult [facility] Access PROGRAM  [place] [phone] Patients are seen by appointment only. Walk-ins are not accepted. [place] will  see patients 78 years of age and older. Monday - Tuesday (8am-5pm) Most Wednesdays (8:30-5pm) $30 per visit, cash only  [facility] Adult Dental Access PROGRAM  [address], [facility] [phone] Patients are seen by appointment only. Walk-ins are not accepted. [place] will see patients 19 years of age and older. One Wednesday Evening (Monthly: Volunteer Based).  $30 per visit, cash only  [place]  [phone] for adults; Children under age 77, call Graduate Pediatric Dentistry at [phone]. Children aged 59-14, please call [phone] to request a pediatric application.  Dental services are provided in all areas of dental care including fillings, crowns and bridges, complete and partial dentures, implants, gum treatment, root canals, and extractions. Preventive care is also provided. Treatment is provided to both adults and children. Patients are selected via a lottery and there is often a waiting list.   [facility] [address] [place]  [phone] [email or web address]   [facility] [address] [place], [place]  [phone], Ext. [phone] Second and Fourth Thursday of each month, opens at 6:30 AM; Clinic ends at 9 AM.  Patients are seen on a first-come first-served basis, and a limited number are seen during each clinic.   [facility]  [address] Hillard Danker [place], [place] [phone]   Eligibility Requirements You must have lived in [place], [place], or [place] counties for at least the last three months.   You cannot be eligible for state or federal sponsored [employer], including [employer], [place], or [employer].   You generally cannot be eligible for healthcare insurance through your employer.    How to apply: Eligibility screenings are held every Tuesday and Wednesday afternoon from 1:00 pm until 4:00 pm. You do not need an appointment for the interview!  [facility] [address] [place], [place]   [place]  [place] Department  [place]  [phone]    Behavioral Health Resources in the Community: Intensive Outpatient Programs Organization         Address  Phone  Notes  [facility] [place]. [address] [place], [place] [phone]   [facility] [address], [place], [place]   ADS: Alcohol & Drug Svcs [address], [place], [place]   [place] [address]  [place], [place] or [phone]   Substance Abuse Resources Organization         Address  Phone  Notes  Alcohol and Drug Services  [phone]   [place]  [phone]   [facility] [place]   Chinita Pester  [phone]   Residential & Outpatient Substance Abuse Program  [phone]   Psychological Services Organization         Address  Phone  Notes  [facility]  [place]  [phone]    [place] [address], [place] or [phone]    Mobile Crisis Teams Organization         Address  Phone  Notes  [facility], Mobile Crisis Care Unit  [phone]   [facility] Psychotherapeutic Services  [address] [place], [place]   [facility] [address] [place] [phone]    Self-Help/Support Groups Organization  Address  Phone             Notes  [place]. of [place] - variety of support groups  [place] Call for more information  Narcotics Anonymous (NA), [facility] [address], [employer] [place]  2 meetings at this location   Special educational needs teacher         Address  Phone  Notes  [facility] [place],    [place]  [phone]   [facility]  [address] [place] [address], [place], [place]   [place], [place] [phone] Admissions: 8am-3pm M-F  Incentives Substance [place] [address],    [place], [place] [phone]   [facility] [address] [place], [place], [place]   [facility] [address]  [place], [place]   [facility] - Intensive Outpatient [address], [place], [place]   [facility] ([place].) [place].,  [place], [place] [phone] or [phone]   [facility] (RTS) [address] [place] Hernandez, [place] Accepts Medicaid  [facility] [address]  [place] [phone] Substance Abuse/Addiction Treatment   [facility] Organization         Address  Phone  Notes  [facility]  [phone]   Domenic Schwab, PhD [address] Arlis [place], [place]   [phone] or [phone]   [place]  [place], [place] [phone]   [facility] [address], [place], [place] [phone] Insurance/Medicaid/sponsorship through [facility] and Families [address], [address] [place]                                    [place], [place] [phone] [place] [address], [place] [phone]    Dr. Adele Schilder  [phone]   [facility] Dept. 1) [address] [place] 2) [place] 3)  [place] [address], [place] [phone]  [phone]   [place] [phone] or [phone] (After Hours)     You Can Quit Smoking If you are ready to quit smoking or are thinking about it, congratulations! You have chosen to help yourself be healthier and live longer! There are lots of different ways to quit smoking. Nicotine gum, nicotine patches, a nicotine inhaler, or nicotine nasal spray can help with physical craving. Hypnosis, support groups, and medicines help break the habit of smoking. TIPS TO GET OFF AND STAY OFF CIGARETTES  Learn to predict your moods. Do not let a bad situation be your excuse to have a cigarette. Some situations in your life might tempt you to have a cigarette.  Ask friends and co-workers not to smoke around you.  Make your home smoke-free.  Never have "just one" cigarette. It leads to wanting another and another. Remind yourself of your decision to quit.  On a card, make a list of your reasons for not smoking. Read it at least the same number of times a day as you have a cigarette. Tell yourself everyday, "I do not want to smoke. I choose not to  smoke."  Ask someone at home or work to help you with your plan to quit smoking.  Have something planned after you eat or have a cup of coffee. Take a walk or get other exercise to perk you up. This will help to keep you from overeating.  Try a  relaxation exercise to calm you down and decrease your stress. Remember, you may be tense and nervous the first two weeks after you quit. This will pass.  Find new activities to keep your hands busy. Play with a pen, coin, or rubber band. Doodle or draw things on paper.  Brush your teeth right after eating. This will help cut down the craving for the taste of tobacco after meals. You can try mouthwash too.  Try gum, breath mints, or diet candy to keep something in your mouth. IF YOU SMOKE AND WANT TO QUIT:  Do not stock up on cigarettes. Never buy a carton. Wait until one pack is finished before you buy another.  Never carry cigarettes with you at work or at home.  Keep cigarettes as far away from you as possible. Leave them with someone else.  Never carry matches or a lighter with you.  Ask yourself, "Do I need this cigarette or is this just a reflex?"  Bet with someone that you can quit. Put cigarette money in a piggy bank every morning. If you smoke, you give up the money. If you do not smoke, by the end of the week, you keep the money.  Keep trying. It takes 21 days to change a habit!  Talk to your doctor about using medicines to help you quit. These include nicotine replacement gum, lozenges, or skin patches. Document Released: 06/11/2009 Document Revised: 11/07/2011 Document Reviewed: 06/11/2009 [facility] Patient Information 2015 [place], [place]. This information is not intended to replace advice given to you by your health care provider. Make sure you discuss any questions you have with your health care provider.   Chronic Obstructive Pulmonary Disease Exacerbation  Chronic obstructive pulmonary disease (COPD) is a common lung problem. In COPD, the flow of air from the lungs is limited. COPD exacerbations are times that breathing gets worse and you need extra treatment. Without treatment they can be life threatening. If they happen often, your lungs can become more damaged. HOME  CARE  Do not smoke.  Avoid tobacco smoke and other things that bother your lungs.  If given, take your antibiotic medicine as told. Finish the medicine even if you start to feel better.  Only take medicines as told by your doctor.  Drink enough fluids to keep your pee (urine) clear or pale yellow (unless your doctor has told you not to).  Use a cool mist machine (vaporizer).  If you use oxygen or a machine that turns liquid medicine into a mist (nebulizer), continue to use them as told.  Keep up with shots (vaccinations) as told by your doctor.  Exercise regularly.  Eat healthy foods.  Keep all doctor visits as told. GET HELP RIGHT AWAY IF:  You are very short of breath and it gets worse.  You have trouble talking.  You have bad chest pain.  You have blood in your spit (sputum).  You have a fever.  You keep throwing up (vomiting).  You feel weak, or you pass out (faint).  You feel confused.  You keep getting worse. MAKE SURE YOU:   Understand these instructions.  Will watch your condition.  Will get help right away if  you are not doing well or get worse. Document Released: 08/04/2011 Document Revised: 06/05/2013 Document Reviewed: 04/19/2013 [facility] Patient Information 2015 [place], [place]. This information is not intended to replace advice given to you by your health care provider. Make sure you discuss any questions you have with your health care provider.

## 2014-04-13 NOTE — Progress Notes (Signed)
Internal Medicine On-Call Attending  Date: 04/13/2014  Patient name: Dylan [name] Medical record number: [id number] Date of birth: [birth year] Age: 53 y.o. Gender: male  I saw and evaluated the patient. I discussed patient and reviewed the resident's note by Dr. Hayes Ludwig, and I agree with the resident's findings and plans as documented in her note, with the following additional comments.  When I saw patient around midday, he reported feeling significantly better; his lung exam at that time showed minimal expiratory wheezing and his peak flow was 250.  He requested discharge home today.  It would be reasonable to ambulate him this afternoon and see how he does; if he continues to do well without recurrence of wheezing, it would be reasonable to send him home on oral prednisone taper with inhaled bronchodilators and followup this week in outpatient clinic.  We have strongly advised patient to stop smoking and he seems committed to doing this; he reports complete relief of nicotine craving with a nicotine patch, and I would continue the nicotine patch upon discharge.  We need to make sure he is able to get his medications when he goes home.

## 2014-04-14 DIAGNOSIS — K7460 Unspecified cirrhosis of liver: Secondary | ICD-10-CM | POA: Diagnosis present

## 2014-04-14 NOTE — ED Provider Notes (Signed)
Medical screening examination/treatment/procedure(s) were conducted as a shared visit with non-physician practitioner(s) and myself.  I personally evaluated the patient during the encounter.   EKG Interpretation   Date/Time:  Friday April 11 2014 11:44:05 EDT Ventricular Rate:  111 PR Interval:  185 QRS Duration: 85 QT Interval:  339 QTC Calculation: 461 R Axis:   -114 Text Interpretation:  Age not entered, assumed to be  53 years old for  purpose of ECG interpretation Sinus tachycardia Left anterior fascicular  block Abnormal R-wave progression, late transition ST elevation, consider  inferior injury ED PHYSICIAN INTERPRETATION AVAILABLE IN [facility]  Confirmed by TEST, Record ([id number]) on 04/13/2014 7:22:18 AM       Patient with diffuse wheezing/SOB. No PNA. Likely a bronchitis/viral etiology, but symptoms poorly controlled with significant albuterol, will need admission for respiratory support.  Ephraim Hamburger, MD 04/14/14 1409

## 2014-04-21 ENCOUNTER — Encounter: Payer: Self-pay | Admitting: Family Medicine

## 2014-04-21 ENCOUNTER — Ambulatory Visit: Payer: Self-pay | Attending: Family Medicine | Admitting: Family Medicine

## 2014-04-21 VITALS — BP 141/94 | HR 86 | Temp 98.4°F | Resp 16 | Ht 66.5 in | Wt 155.0 lb

## 2014-04-21 DIAGNOSIS — R030 Elevated blood-pressure reading, without diagnosis of hypertension: Secondary | ICD-10-CM

## 2014-04-21 DIAGNOSIS — K228 Other specified diseases of esophagus: Secondary | ICD-10-CM | POA: Insufficient documentation

## 2014-04-21 DIAGNOSIS — J441 Chronic obstructive pulmonary disease with (acute) exacerbation: Secondary | ICD-10-CM

## 2014-04-21 DIAGNOSIS — R0989 Other specified symptoms and signs involving the circulatory and respiratory systems: Principal | ICD-10-CM | POA: Insufficient documentation

## 2014-04-21 DIAGNOSIS — B182 Chronic viral hepatitis C: Secondary | ICD-10-CM

## 2014-04-21 DIAGNOSIS — E785 Hyperlipidemia, unspecified: Secondary | ICD-10-CM

## 2014-04-21 DIAGNOSIS — R0609 Other forms of dyspnea: Secondary | ICD-10-CM | POA: Insufficient documentation

## 2014-04-21 DIAGNOSIS — K2289 Other specified disease of esophagus: Secondary | ICD-10-CM | POA: Insufficient documentation

## 2014-04-21 DIAGNOSIS — K219 Gastro-esophageal reflux disease without esophagitis: Secondary | ICD-10-CM | POA: Insufficient documentation

## 2014-04-21 DIAGNOSIS — IMO0001 Reserved for inherently not codable concepts without codable children: Secondary | ICD-10-CM

## 2014-04-21 DIAGNOSIS — F17200 Nicotine dependence, unspecified, uncomplicated: Secondary | ICD-10-CM | POA: Insufficient documentation

## 2014-04-21 LAB — LIPID PANEL
CHOL/HDL RATIO: 2.9 ratio
Cholesterol: 197 mg/dL (ref 0–200)
HDL: 68 mg/dL (ref >39)
LDL CALC: 111 mg/dL — AB (ref 0–99)
TRIGLYCERIDES: 88 mg/dL (ref <150)
VLDL: 18 mg/dL (ref 0–40)

## 2014-04-21 MED ORDER — NICOTINE 21 MG/24HR TD PT24: 21.0000 mg | MEDICATED_PATCH | Freq: Every day | TRANSDERMAL | Status: DC

## 2014-04-21 MED ORDER — ALBUTEROL SULFATE HFA 108 (90 BASE) MCG/ACT IN AERS: 2.0000 | INHALATION_SPRAY | Freq: Four times a day (QID) | RESPIRATORY_TRACT | Status: DC | PRN

## 2014-04-21 NOTE — Progress Notes (Signed)
   Subjective:    Patient ID: Dylan [name], male    DOB: [birth year], 53 y.o.   MRN: [id number] CC: establish care, HFU COPD  HPI 53 year old male presents to establish care and discuss the following:  1. COPD syptoms Patient complains of chronic dyspnea. Symptoms began 3 weeks ago. Patient presented to the ED. There have been no previous episodes. Patient has smoked since age 42.  Patient uses 3 pillows at night. Patient currently is not on home oxygen therapy.Marland Kitchen Respiratory history: no history of pneumonia or bronchitis.  2. Smoker: started smoking at age 33, last cigarette 2 weeks ago.  Previously smoked one to one and half packs per day.  3. Esophageal nodules: dx in 2011 and 2012. Dx on EGD. Has reflux. Taking PPI. Was told by his previous GI doctor that he should followup with EGDs regularly. His muscle followup since his insurance.  Review of Systems As per HPI    Objective:   Physical Exam BP 141/94  Pulse 86  Temp(Src) 98.4 F (36.9 C) (Oral)  Resp 16  Ht 5' 6.5" (1.689 m)  Wt 155 lb (70.308 kg)  BMI 24.65 kg/m2  SpO2 98% General appearance: alert, cooperative and no distress Eyes: conjunctivae/corneas clear. PERRL, EOM's intact.  Ears: abnormal external canal right ear - ceruminosis impacting canal and abnormal external canal left ear - ceruminosis impacting canal Nose: Nares normal. Septum midline. Mucosa normal. No drainage or sinus tenderness. Throat: lips, mucosa, and tongue normal; teeth and gums normal Lungs: clear to auscultation bilaterally Heart: regular rate and rhythm, S1, S2 normal, no murmur, click, rub or gallop Extremities: extremities normal, atraumatic, no cyanosis or edema     Assessment & Plan:

## 2014-04-21 NOTE — Assessment & Plan Note (Signed)
A: quit since being treated for bronchitis concerning for COPD P: Nicotine patch Smoking cessation material.

## 2014-04-21 NOTE — Assessment & Plan Note (Signed)
A: elevated on today's exam. No history of hypertension. P:  RTC next week for RN BP check if elevated patient will need f/u with me to discuss initiating BP medicine.  Smoking  Cessation.

## 2014-04-21 NOTE — Progress Notes (Signed)
Pt is here to establish care. Pt was in the hospital with COPD, wheezing and acute bronchitis.  Pt states that he has a tightness in his chest when he take a deep breath.

## 2014-04-21 NOTE — Assessment & Plan Note (Signed)
A: doing well. No cough or CP.  Meds: compliant P: Refer to [facility] for PFTs

## 2014-04-21 NOTE — Patient Instructions (Signed)
Mr. Dylan [name],  Thank you for coming in today. It was a pleasure meeting you. I look for to being a primary doctor.   #1 regarding your recent consultation questionable COPD: Continue current inhalers. Please continue smoking cessation. Please go to the family medicine Center for pulmonary function test.  #2 smoking:  Congratulations on quitting you are doing an excellent job. Smoking cessation support: smoking cessation hotline: 1-800-QUIT-NOW.  Smoking cessation classes are available through [facility]. Call [phone] or visit our website at [email or web address]  #3 elevated blood pressure: Her blood pressures elevated above normal today. No blood pressures less than 140/90. I like you to return in 1 to 2 weeks for nurse visit to have her blood pressure rechecked if it is elevated above 140/90 at the time you have an appointment with me to discuss treating her blood pressure. Continue smoking cessation as this will help normalize her blood pressure. He is a low-salt diet as this will help normalize her blood pressure.   Follow up  With nurse in 1-2 week for BP check  With me in 2-4 weeks after pulmonary function test.   Desired referrals: Eye doctor  GI doctor Meet with financial counselor first.   Dr. Adrian Blackwater

## 2014-04-22 DIAGNOSIS — E785 Hyperlipidemia, unspecified: Secondary | ICD-10-CM | POA: Insufficient documentation

## 2014-04-22 NOTE — Assessment & Plan Note (Signed)
A: patient in a statin benefit group given calculated risk P: discuss risk and benefit of statin with patient at f/u.

## 2014-04-24 ENCOUNTER — Telehealth: Payer: Self-pay | Admitting: *Deleted

## 2014-04-24 NOTE — Telephone Encounter (Signed)
Pt is aware of his lab results. 

## 2014-04-24 NOTE — Telephone Encounter (Signed)
Message copied by Joan Mayans on Thu Apr 24, 2014 10:28 AM ------      Message from: Boykin Nearing      Created: Tue Apr 22, 2014  2:35 PM       Please inform patient.       Elevated cholesterol.      Calculated heart disease risk places patient in statin benefit group.      Dr. Adrian Blackwater would like to discuss possibly starting a statin, lipitor 20 mg, at your f/u appt.       In the meantime keep a high fiber, high vegetable, low saturated fat diet. ------

## 2014-05-08 ENCOUNTER — Ambulatory Visit: Payer: Self-pay | Attending: Family Medicine | Admitting: Family Medicine

## 2014-05-08 ENCOUNTER — Encounter: Payer: Self-pay | Admitting: Family Medicine

## 2014-05-08 VITALS — BP 138/73 | HR 88 | Temp 98.4°F | Resp 16 | Ht 66.5 in | Wt 156.0 lb

## 2014-05-08 DIAGNOSIS — E785 Hyperlipidemia, unspecified: Secondary | ICD-10-CM

## 2014-05-08 DIAGNOSIS — J449 Chronic obstructive pulmonary disease, unspecified: Secondary | ICD-10-CM | POA: Insufficient documentation

## 2014-05-08 DIAGNOSIS — J441 Chronic obstructive pulmonary disease with (acute) exacerbation: Secondary | ICD-10-CM

## 2014-05-08 DIAGNOSIS — K219 Gastro-esophageal reflux disease without esophagitis: Secondary | ICD-10-CM | POA: Insufficient documentation

## 2014-05-08 DIAGNOSIS — IMO0001 Reserved for inherently not codable concepts without codable children: Secondary | ICD-10-CM

## 2014-05-08 DIAGNOSIS — R030 Elevated blood-pressure reading, without diagnosis of hypertension: Secondary | ICD-10-CM | POA: Insufficient documentation

## 2014-05-08 DIAGNOSIS — J4489 Other specified chronic obstructive pulmonary disease: Secondary | ICD-10-CM | POA: Insufficient documentation

## 2014-05-08 DIAGNOSIS — F17200 Nicotine dependence, unspecified, uncomplicated: Secondary | ICD-10-CM | POA: Insufficient documentation

## 2014-05-08 MED ORDER — PANTOPRAZOLE SODIUM 40 MG PO TBEC: 40.0000 mg | DELAYED_RELEASE_TABLET | Freq: Every day | ORAL | Status: DC

## 2014-05-08 NOTE — Patient Instructions (Signed)
Mr. Yo,  Thank you for coming in to see me today.  #1 regarding your recent consultation questionable COPD. Referral to [facility] for pulmonary function test to determine if you need daily inhalers.   #2 smoking:  Excellent work quitting! If you ever need support, smoking cessation support: smoking cessation hotline: 1-800-QUIT-NOW.  Smoking cessation classes are available through [facility]. Call [phone] or visit our website at [email or web address]  Refilled prilosec .   F/u after PFTs.   Dr. Adrian Blackwater

## 2014-05-08 NOTE — Progress Notes (Signed)
   Subjective:    Patient ID: Dylan [name], male    DOB: Dec 30, 1960, 53 y.o.   MRN: [id number] CC: f/u elevated PFTs and ? COPD HPI  1. Elevated BP: no CP, SOB, quit smoking. Does not exercise or keep a low salt diet.  2. ?COPD: not using inhalers. Has quit smoking. Denies cough, CP, SOB. Has not yet gone for PFTs.  3. Reflux: to juice, substernal pain with small amounts of or regurgitation.  Soc hx: current smoker  Review of Systems As per HPI     Objective:   Physical Exam BP 138/73  Pulse 88  Temp(Src) 98.4 F (36.9 C) (Oral)  Resp 16  Ht 5' 6.5" (1.689 m)  Wt 156 lb (70.761 kg)  BMI 24.80 kg/m2  SpO2 97% General appearance: alert, cooperative and no distress Lungs: clear to auscultation bilaterally Heart: regular rate and rhythm, S1, S2 normal, no murmur, click, rub or gallop Abdomen: soft, non-tender; bowel sounds normal; no masses,  no organomegaly    Assessment & Plan:

## 2014-05-08 NOTE — Assessment & Plan Note (Addendum)
Patient instructed to have PFts at the [facility] to determine if he has COPD or not.  No s/s of exacerbation.

## 2014-05-08 NOTE — Assessment & Plan Note (Signed)
Normal BP today  Recommend low salt diet and exercise

## 2014-05-08 NOTE — Assessment & Plan Note (Signed)
Former smoker 

## 2014-05-08 NOTE — Assessment & Plan Note (Signed)
A: trigger is juice. Patient loves juice P: PPI prn

## 2014-05-08 NOTE — Progress Notes (Signed)
F/U HTN Pt stated feeling better since last visit

## 2014-06-02 ENCOUNTER — Encounter (HOSPITAL_COMMUNITY): Payer: Self-pay | Admitting: Emergency Medicine

## 2014-06-02 ENCOUNTER — Emergency Department (HOSPITAL_COMMUNITY)
Admission: EM | Admit: 2014-06-02 | Discharge: 2014-06-02 | Disposition: A | Discharge status: Home / Self Care | Payer: No Typology Code available for payment source | Attending: Emergency Medicine | Admitting: Emergency Medicine

## 2014-06-02 DIAGNOSIS — Y9389 Activity, other specified: Secondary | ICD-10-CM | POA: Diagnosis not present

## 2014-06-02 DIAGNOSIS — Z7951 Long term (current) use of inhaled steroids: Secondary | ICD-10-CM | POA: Insufficient documentation

## 2014-06-02 DIAGNOSIS — M62838 Other muscle spasm: Secondary | ICD-10-CM

## 2014-06-02 DIAGNOSIS — Z720 Tobacco use: Secondary | ICD-10-CM | POA: Insufficient documentation

## 2014-06-02 DIAGNOSIS — S169XXA Unspecified injury of muscle, fascia and tendon at neck level, initial encounter: Secondary | ICD-10-CM | POA: Insufficient documentation

## 2014-06-02 DIAGNOSIS — Z8719 Personal history of other diseases of the digestive system: Secondary | ICD-10-CM | POA: Diagnosis not present

## 2014-06-02 DIAGNOSIS — Z79899 Other long term (current) drug therapy: Secondary | ICD-10-CM | POA: Diagnosis not present

## 2014-06-02 DIAGNOSIS — Y92410 Unspecified street and highway as the place of occurrence of the external cause: Secondary | ICD-10-CM | POA: Diagnosis not present

## 2014-06-02 DIAGNOSIS — Z8619 Personal history of other infectious and parasitic diseases: Secondary | ICD-10-CM | POA: Diagnosis not present

## 2014-06-02 MED ORDER — OXYCODONE-ACETAMINOPHEN 5-325 MG PO TABS: 1.0000 | ORAL_TABLET | ORAL | Status: DC | PRN

## 2014-06-02 MED ORDER — METHOCARBAMOL 500 MG PO TABS: 500.0000 mg | ORAL_TABLET | Freq: Two times a day (BID) | ORAL | Status: DC

## 2014-06-02 MED ORDER — ONDANSETRON HCL 4 MG PO TABS: 4.0000 mg | ORAL_TABLET | Freq: Four times a day (QID) | ORAL | Status: DC

## 2014-06-02 NOTE — Discharge Instructions (Signed)
Take the prescribed medication as directed.  May wish to apply heating pad to affected areas to help relax muscles. Follow-up with your primary care physician. Return to the ED for new or worsening symptoms.
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
## 2014-06-05 NOTE — ED Provider Notes (Signed)
Medical screening examination/treatment/procedure(s) were performed by non-physician practitioner and as supervising physician I was immediately available for consultation/collaboration.   EKG Interpretation None        Debby Freiberg, MD 06/05/14 [phone]

## 2014-06-11 ENCOUNTER — Encounter: Payer: Self-pay | Admitting: Family Medicine

## 2014-06-11 ENCOUNTER — Ambulatory Visit: Payer: No Typology Code available for payment source | Attending: Family Medicine | Admitting: Family Medicine

## 2014-06-11 VITALS — BP 138/87 | HR 78 | Temp 98.4°F | Resp 18 | Ht 67.0 in | Wt 162.0 lb

## 2014-06-11 DIAGNOSIS — Z23 Encounter for immunization: Secondary | ICD-10-CM

## 2014-06-11 DIAGNOSIS — Z418 Encounter for other procedures for purposes other than remedying health state: Secondary | ICD-10-CM

## 2014-06-11 DIAGNOSIS — M25512 Pain in left shoulder: Secondary | ICD-10-CM | POA: Insufficient documentation

## 2014-06-11 DIAGNOSIS — M542 Cervicalgia: Secondary | ICD-10-CM | POA: Insufficient documentation

## 2014-06-11 DIAGNOSIS — Z299 Encounter for prophylactic measures, unspecified: Secondary | ICD-10-CM

## 2014-06-11 DIAGNOSIS — Z720 Tobacco use: Secondary | ICD-10-CM | POA: Insufficient documentation

## 2014-06-11 DIAGNOSIS — G8911 Acute pain due to trauma: Secondary | ICD-10-CM | POA: Insufficient documentation

## 2014-06-11 DIAGNOSIS — M545 Low back pain: Secondary | ICD-10-CM | POA: Insufficient documentation

## 2014-06-11 MED ORDER — TRAMADOL HCL 50 MG PO TABS: 50.0000 mg | ORAL_TABLET | Freq: Three times a day (TID) | ORAL | Status: DC | PRN

## 2014-06-11 MED ORDER — CYCLOBENZAPRINE HCL 10 MG PO TABS: 10.0000 mg | ORAL_TABLET | Freq: Three times a day (TID) | ORAL | Status: DC | PRN

## 2014-06-11 MED ORDER — MELOXICAM 15 MG PO TABS
15.0000 mg | ORAL_TABLET | Freq: Every day | ORAL | Status: DC
Start: 2014-06-11 — End: 2014-11-12
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
## 2014-06-11 NOTE — Progress Notes (Signed)
   Subjective:    Patient ID: Dylan [name], male    DOB: [birth year], 53 y.o.   MRN: [id number]  HPI    Review of Systems     Objective:   Physical Exam        Assessment & Plan:
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
## 2014-11-12 ENCOUNTER — Ambulatory Visit: Payer: Self-pay | Attending: Family Medicine | Admitting: Family Medicine

## 2014-11-12 ENCOUNTER — Encounter: Payer: Self-pay | Admitting: Family Medicine

## 2014-11-12 VITALS — BP 149/91 | HR 85 | Temp 98.7°F | Resp 16 | Ht 67.0 in | Wt 171.0 lb

## 2014-11-12 DIAGNOSIS — R1032 Left lower quadrant pain: Secondary | ICD-10-CM | POA: Insufficient documentation

## 2014-11-12 DIAGNOSIS — R1031 Right lower quadrant pain: Secondary | ICD-10-CM | POA: Insufficient documentation

## 2014-11-12 DIAGNOSIS — R1030 Lower abdominal pain, unspecified: Secondary | ICD-10-CM

## 2014-11-12 DIAGNOSIS — R109 Unspecified abdominal pain: Secondary | ICD-10-CM | POA: Insufficient documentation

## 2014-11-12 DIAGNOSIS — B182 Chronic viral hepatitis C: Secondary | ICD-10-CM

## 2014-11-12 DIAGNOSIS — K219 Gastro-esophageal reflux disease without esophagitis: Secondary | ICD-10-CM

## 2014-11-12 LAB — CBC
HCT: 47.5 % (ref 39.0–52.0)
Hemoglobin: 16.1 g/dL (ref 13.0–17.0)
MCH: 32 pg (ref 26.0–34.0)
MCHC: 33.9 g/dL (ref 30.0–36.0)
MCV: 94.4 fL (ref 78.0–100.0)
MPV: 10 fL (ref 8.6–12.4)
Platelets: 128 10*3/uL — ABNORMAL LOW (ref 150–400)
RBC: 5.03 MIL/uL (ref 4.22–5.81)
RDW: 14.5 % (ref 11.5–15.5)
WBC: 4.3 10*3/uL (ref 4.0–10.5)

## 2014-11-12 LAB — POCT URINALYSIS DIPSTICK
Bilirubin, UA: NEGATIVE
Glucose, UA: NEGATIVE
KETONES UA: NEGATIVE
Leukocytes, UA: NEGATIVE
NITRITE UA: NEGATIVE
PH UA: 5.5
Protein, UA: NEGATIVE
SPEC GRAV UA: 1.025
UROBILINOGEN UA: 1

## 2014-11-12 LAB — LIPASE: Lipase: 21 U/L (ref 0–75)

## 2014-11-12 LAB — COMPLETE METABOLIC PANEL WITH GFR
ALK PHOS: 59 U/L (ref 39–117)
ALT: 42 U/L (ref 0–53)
AST: 45 U/L — ABNORMAL HIGH (ref 0–37)
Albumin: 4.3 g/dL (ref 3.5–5.2)
BILIRUBIN TOTAL: 0.8 mg/dL (ref 0.2–1.2)
BUN: 10 mg/dL (ref 6–23)
CO2: 25 mEq/L (ref 19–32)
CREATININE: 1.18 mg/dL (ref 0.50–1.35)
Calcium: 9.1 mg/dL (ref 8.4–10.5)
Chloride: 107 mEq/L (ref 96–112)
GFR, EST NON AFRICAN AMERICAN: 69 mL/min
GFR, Est African American: 80 mL/min
GLUCOSE: 82 mg/dL (ref 70–99)
Potassium: 4.3 mEq/L (ref 3.5–5.3)
Sodium: 142 mEq/L (ref 135–145)
Total Protein: 7.4 g/dL (ref 6.0–8.3)

## 2014-11-12 LAB — HEPATITIS C ANTIBODY: HCV Ab: REACTIVE — AB

## 2014-11-12 MED ORDER — PANTOPRAZOLE SODIUM 40 MG PO TBEC: 40.0000 mg | DELAYED_RELEASE_TABLET | Freq: Every day | ORAL | Status: DC

## 2014-11-12 MED ORDER — TRAMADOL HCL 50 MG PO TABS: 50.0000 mg | ORAL_TABLET | Freq: Two times a day (BID) | ORAL | Status: DC

## 2014-11-12 MED ORDER — CYCLOBENZAPRINE HCL 10 MG PO TABS: 10.0000 mg | ORAL_TABLET | Freq: Three times a day (TID) | ORAL | Status: DC | PRN

## 2014-11-12 NOTE — Patient Instructions (Addendum)
Mr. Dylan [name],  Thank you for coming in today.  1 abdominal pain:  I am most concerned about your liver given Hep C history and pancrease given regular alcohol intake. There test for blood in stool is faintly positive. The test for blood in urine is also faintly postive.  Plan: Continue prilosec, tramadol for pain, flexeril to relax muscles.  Avoid NSAID-ibuprofen, aleve, etc.  Avoid alcohol-do not stop cold [place] but do taper down or drink beer instead. Checking blood work to check liver, pancrease, kidneys, hep C  Getting CT scan we will call you with appt details  GI referral as you will need another colonoscopy    If you develop fever or vomiting seek medical attention right away.  F/u in 4 weeks for abdominal pain   Dr. Adrian Blackwater

## 2014-11-12 NOTE — Progress Notes (Signed)
   Subjective:    Patient ID: Dylan [name], male    DOB: 1961/05/12, 54 y.o.   MRN: [id number] CC: abdominal pain x 1 month  HPI 54 yo M:  1. Abdominal pain: LLQ mostly. Also RLQ. Some nausea. No emesis or fever. Greasy stools. Patient with hx of Hep C, treated. Patient drinks Vodka most day a pint last 3 days.   Soc Hx: former smoker quit  Review of Systems As per HPI    Objective:   Physical Exam BP 149/91 mmHg  Pulse 85  Temp(Src) 98.7 F (37.1 C) (Oral)  Resp 16  Ht 5\' 7"  (1.702 m)  Wt 171 lb (77.565 kg)  BMI 26.78 kg/m2  SpO2 96% General appearance: alert, cooperative and no distress Throat: lips, mucosa, and tongue normal; teeth and gums normal Lungs: clear to auscultation bilaterally Heart: regular rate and rhythm, S1, S2 normal, no murmur, click, rub or gallop Abdomen: slightly distended, TTP b/l LQ, no masses, rebound or guarding,  Rectal: normal tone, normal prostate, no masses or tenderness and FOBT +      Assessment & Plan:

## 2014-11-12 NOTE — Progress Notes (Signed)
Complaining of abdominal pain and back pain x1 month  Stated has normal BM, abdominal area is hard  Stool is foamy no blood noticed  No tobacco productus since August 2015

## 2014-11-12 NOTE — Assessment & Plan Note (Addendum)
abdominal pain:  I am most concerned about your liver given Hep C history and pancrease given regular alcohol intake. There test for blood in stool is faintly positive. The test for blood in urine is also faintly postive.  Plan: Continue prilosec, tramadol for pain, flexeril to relax muscles.  Avoid NSAID-ibuprofen, aleve, etc.  Avoid alcohol-do not stop cold [place] but do taper down or drink beer instead. Checking blood work to check liver, pancrease, kidneys, hep C  Getting CT scan we will call you with appt details  GI referral as you will need another colonoscopy

## 2014-11-13 ENCOUNTER — Telehealth: Payer: Self-pay | Admitting: *Deleted

## 2014-11-13 LAB — AMMONIA: AMMONIA: 38 umol/L (ref 16–53)

## 2014-11-13 LAB — URINE CULTURE
Colony Count: NO GROWTH
Organism ID, Bacteria: NO GROWTH

## 2014-11-13 NOTE — Telephone Encounter (Signed)
Pt aware of results 

## 2014-11-13 NOTE — Telephone Encounter (Signed)
-----   Message from Boykin Nearing, MD sent at 11/13/2014  9:22 AM EDT ----- Normal ammonia level, ammonia can be high in some advance forms of liver disease

## 2014-11-14 LAB — HEPATITIS C RNA QUANTITATIVE: HCV QUANT: NOT DETECTED [IU]/mL (ref <15)

## 2014-11-19 ENCOUNTER — Telehealth: Payer: Self-pay | Admitting: *Deleted

## 2014-11-19 ENCOUNTER — Telehealth: Payer: Self-pay | Admitting: Family Medicine

## 2014-11-19 NOTE — Telephone Encounter (Signed)
-----   Message from Boykin Nearing, MD sent at 11/14/2014  8:39 AM EDT ----- Negative urine culture

## 2014-11-19 NOTE — Telephone Encounter (Signed)
-----   Message from Boykin Nearing, MD sent at 11/17/2014  9:02 AM EDT ----- Hep C viral load is undetectable, effectively treated  Normal CMP except for slightly elevated AST-please cutdown on ETOH Normal lipase CBC normal

## 2014-11-19 NOTE — Telephone Encounter (Signed)
       -----   Message from Boykin Nearing, MD sent at 11/17/2014 9:02 AM EDT ----- Left voice message to return call   Hep C viral load is undetectable, effectively treated  Normal CMP except for slightly elevated AST-please cutdown on ETOH Normal lipase CBC normal

## 2014-11-19 NOTE — Telephone Encounter (Signed)
Pt returning nurse's call about results, please f/u with pt.

## 2014-11-19 NOTE — Telephone Encounter (Signed)
Left voice message to return call 

## 2014-11-20 ENCOUNTER — Other Ambulatory Visit: Payer: Self-pay | Admitting: Family Medicine

## 2014-11-20 ENCOUNTER — Ambulatory Visit (HOSPITAL_COMMUNITY)
Admission: RE | Admit: 2014-11-20 | Discharge: 2014-11-20 | Disposition: A | Discharge status: Home / Self Care | Payer: Self-pay | Source: Ambulatory Visit | Attending: Family Medicine | Admitting: Family Medicine

## 2014-11-20 ENCOUNTER — Encounter (HOSPITAL_COMMUNITY): Payer: Self-pay

## 2014-11-20 DIAGNOSIS — N200 Calculus of kidney: Secondary | ICD-10-CM | POA: Insufficient documentation

## 2014-11-20 DIAGNOSIS — K7030 Alcoholic cirrhosis of liver without ascites: Secondary | ICD-10-CM

## 2014-11-20 DIAGNOSIS — K8020 Calculus of gallbladder without cholecystitis without obstruction: Secondary | ICD-10-CM | POA: Insufficient documentation

## 2014-11-20 DIAGNOSIS — K7460 Unspecified cirrhosis of liver: Secondary | ICD-10-CM | POA: Insufficient documentation

## 2014-11-20 DIAGNOSIS — B1920 Unspecified viral hepatitis C without hepatic coma: Secondary | ICD-10-CM | POA: Insufficient documentation

## 2014-11-20 DIAGNOSIS — R1030 Lower abdominal pain, unspecified: Secondary | ICD-10-CM | POA: Insufficient documentation

## 2014-11-20 DIAGNOSIS — K921 Melena: Secondary | ICD-10-CM | POA: Insufficient documentation

## 2014-11-20 DIAGNOSIS — R312 Other microscopic hematuria: Secondary | ICD-10-CM | POA: Insufficient documentation

## 2014-11-20 MED ORDER — IOHEXOL 300 MG/ML  SOLN
100.0000 mL | Freq: Once | INTRAMUSCULAR | Status: AC | PRN
  Administered 2014-11-20: 100 mL via INTRAVENOUS

## 2014-11-20 NOTE — Assessment & Plan Note (Signed)
CT abdomen reveals gallstones, kidney stones and cirrhosis. Plan:  Urology referral for kidney stones. GI referral for evidence of cirrhosis (hardening of liver). Cut down ETOH with goal of quitting completely. If you need help [facility] substance abuse help.  For gallstones, there is no evidence or inflammation on gallbladder or pancreas so will monitor for now. Low fat diet to avoid irritating gallstones.

## 2014-11-25 ENCOUNTER — Telehealth: Payer: Self-pay | Admitting: Family Medicine

## 2014-11-25 NOTE — Telephone Encounter (Signed)
Patient called to request results, please f/u with pt.

## 2014-11-26 ENCOUNTER — Telehealth: Payer: Self-pay | Admitting: Family Medicine

## 2014-11-26 NOTE — Telephone Encounter (Signed)
Patient called to request his CT scan results

## 2014-12-01 ENCOUNTER — Ambulatory Visit (INDEPENDENT_AMBULATORY_CARE_PROVIDER_SITE_OTHER): Payer: Self-pay | Admitting: Physician Assistant

## 2014-12-01 ENCOUNTER — Other Ambulatory Visit (INDEPENDENT_AMBULATORY_CARE_PROVIDER_SITE_OTHER): Payer: Self-pay

## 2014-12-01 ENCOUNTER — Encounter: Payer: Self-pay | Admitting: Physician Assistant

## 2014-12-01 VITALS — BP 130/88 | HR 96 | Ht 67.0 in | Wt 169.4 lb

## 2014-12-01 DIAGNOSIS — R935 Abnormal findings on diagnostic imaging of other abdominal regions, including retroperitoneum: Secondary | ICD-10-CM

## 2014-12-01 DIAGNOSIS — K7460 Unspecified cirrhosis of liver: Secondary | ICD-10-CM

## 2014-12-01 DIAGNOSIS — R1033 Periumbilical pain: Secondary | ICD-10-CM

## 2014-12-01 DIAGNOSIS — Z8719 Personal history of other diseases of the digestive system: Secondary | ICD-10-CM

## 2014-12-01 LAB — AMMONIA: Ammonia: 28 umol/L (ref 11–35)

## 2014-12-01 LAB — PROTIME-INR
INR: 1.1 ratio — ABNORMAL HIGH (ref 0.8–1.0)
PROTHROMBIN TIME: 12 s (ref 9.6–13.1)

## 2014-12-01 NOTE — Telephone Encounter (Signed)
Pt aware or results Stated do not need [facility] information at this time

## 2014-12-01 NOTE — Telephone Encounter (Signed)
-----   Message from Boykin Nearing, MD sent at 11/20/2014  4:52 PM EDT ----- CT abdomen reveals gallstones, kidney stones and cirrhosis. Plan:  Urology referral for kidney stones. GI referral for evidence of cirrhosis (hardening of liver). Cut down ETOH with goal of quitting completely. If you need help [facility] substance abuse help.  For gallstones, there is no evidence or inflammation on gallbladder or pancreas so will monitor for now. Low fat diet to avoid irritating gallstones.

## 2014-12-01 NOTE — Patient Instructions (Addendum)
You have been scheduled for an MRI at [facility] on Friday 12-05-2014. Your appointment time is 7:00  am  Please arrive 15 minutes prior to your appointment time for registration purposes. Please make certain not to have anything to eat or drink 6 hours prior to your test. In addition, if you have any metal in your body, have a pacemaker or defibrillator, please be sure to let your ordering physician know. This test typically takes 45 minutes to 1 hour to complete.  You have been scheduled for an endoscopy and colonoscopy. Please follow the written instructions given to you at your visit today. We heve given you a free colonoscopy prep. If you use inhalers (even only as needed), please bring them with you on the day of your procedure.   Please go to the basement level to have your labs drawn.

## 2014-12-01 NOTE — Progress Notes (Signed)
Reviewed and agree with management. Galya Dunnigan D. Masashi Snowdon, M.D., FACG  
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
## 2014-12-02 LAB — AFP TUMOR MARKER: AFP TUMOR MARKER: 8.3 ng/mL — AB (ref <6.1)

## 2014-12-05 ENCOUNTER — Ambulatory Visit (HOSPITAL_COMMUNITY)
Admission: RE | Admit: 2014-12-05 | Discharge: 2014-12-05 | Disposition: A | Discharge status: Home / Self Care | Payer: No Typology Code available for payment source | Source: Ambulatory Visit | Attending: Physician Assistant | Admitting: Physician Assistant

## 2014-12-05 DIAGNOSIS — K8020 Calculus of gallbladder without cholecystitis without obstruction: Secondary | ICD-10-CM | POA: Insufficient documentation

## 2014-12-05 DIAGNOSIS — B182 Chronic viral hepatitis C: Secondary | ICD-10-CM | POA: Insufficient documentation

## 2014-12-05 DIAGNOSIS — K7460 Unspecified cirrhosis of liver: Secondary | ICD-10-CM | POA: Insufficient documentation

## 2014-12-05 DIAGNOSIS — R935 Abnormal findings on diagnostic imaging of other abdominal regions, including retroperitoneum: Secondary | ICD-10-CM

## 2014-12-05 DIAGNOSIS — I700 Atherosclerosis of aorta: Secondary | ICD-10-CM | POA: Insufficient documentation

## 2014-12-05 DIAGNOSIS — R1033 Periumbilical pain: Secondary | ICD-10-CM

## 2014-12-05 MED ORDER — GADOBENATE DIMEGLUMINE 529 MG/ML IV SOLN: 10.0000 mL | Freq: Once | INTRAVENOUS | Status: AC | PRN

## 2014-12-05 MED ORDER — GADOXETATE DISODIUM 0.25 MMOL/ML IV SOLN: 8.0000 mL | Freq: Once | INTRAVENOUS | Status: AC | PRN

## 2014-12-11 ENCOUNTER — Encounter (HOSPITAL_COMMUNITY)
Admission: RE | Disposition: A | Discharge status: Home / Self Care | Payer: Self-pay | Source: Ambulatory Visit | Attending: Gastroenterology

## 2014-12-11 ENCOUNTER — Ambulatory Visit (HOSPITAL_COMMUNITY)
Admission: RE | Admit: 2014-12-11 | Discharge: 2014-12-11 | Disposition: A | Discharge status: Home / Self Care | Payer: Self-pay | Source: Ambulatory Visit | Attending: Gastroenterology | Admitting: Gastroenterology

## 2014-12-11 ENCOUNTER — Encounter (HOSPITAL_COMMUNITY): Payer: Self-pay

## 2014-12-11 DIAGNOSIS — J449 Chronic obstructive pulmonary disease, unspecified: Secondary | ICD-10-CM | POA: Insufficient documentation

## 2014-12-11 DIAGNOSIS — Z79891 Long term (current) use of opiate analgesic: Secondary | ICD-10-CM | POA: Insufficient documentation

## 2014-12-11 DIAGNOSIS — R109 Unspecified abdominal pain: Secondary | ICD-10-CM | POA: Insufficient documentation

## 2014-12-11 DIAGNOSIS — R1033 Periumbilical pain: Secondary | ICD-10-CM

## 2014-12-11 DIAGNOSIS — K7689 Other specified diseases of liver: Secondary | ICD-10-CM | POA: Insufficient documentation

## 2014-12-11 DIAGNOSIS — R140 Abdominal distension (gaseous): Secondary | ICD-10-CM | POA: Insufficient documentation

## 2014-12-11 DIAGNOSIS — N189 Chronic kidney disease, unspecified: Secondary | ICD-10-CM | POA: Insufficient documentation

## 2014-12-11 DIAGNOSIS — Z79899 Other long term (current) drug therapy: Secondary | ICD-10-CM | POA: Insufficient documentation

## 2014-12-11 DIAGNOSIS — K219 Gastro-esophageal reflux disease without esophagitis: Secondary | ICD-10-CM | POA: Insufficient documentation

## 2014-12-11 DIAGNOSIS — Z87891 Personal history of nicotine dependence: Secondary | ICD-10-CM | POA: Insufficient documentation

## 2014-12-11 DIAGNOSIS — K7030 Alcoholic cirrhosis of liver without ascites: Secondary | ICD-10-CM | POA: Insufficient documentation

## 2014-12-11 DIAGNOSIS — R935 Abnormal findings on diagnostic imaging of other abdominal regions, including retroperitoneum: Secondary | ICD-10-CM

## 2014-12-11 DIAGNOSIS — M549 Dorsalgia, unspecified: Secondary | ICD-10-CM | POA: Insufficient documentation

## 2014-12-11 DIAGNOSIS — Z1211 Encounter for screening for malignant neoplasm of colon: Secondary | ICD-10-CM

## 2014-12-11 DIAGNOSIS — Z7951 Long term (current) use of inhaled steroids: Secondary | ICD-10-CM | POA: Insufficient documentation

## 2014-12-11 DIAGNOSIS — K8020 Calculus of gallbladder without cholecystitis without obstruction: Secondary | ICD-10-CM | POA: Insufficient documentation

## 2014-12-11 DIAGNOSIS — K7460 Unspecified cirrhosis of liver: Secondary | ICD-10-CM

## 2014-12-11 DIAGNOSIS — Z8719 Personal history of other diseases of the digestive system: Secondary | ICD-10-CM

## 2014-12-11 HISTORY — PX: ESOPHAGEAL BANDING: SHX5518

## 2014-12-11 HISTORY — PX: ESOPHAGOGASTRODUODENOSCOPY (EGD) WITH PROPOFOL: SHX5813

## 2014-12-11 HISTORY — DX: Nausea with vomiting, unspecified: R11.2

## 2014-12-11 HISTORY — DX: Other specified postprocedural states: Z98.890

## 2014-12-11 HISTORY — PX: COLONOSCOPY WITH PROPOFOL: SHX5780

## 2014-12-11 SURGERY — COLONOSCOPY WITH PROPOFOL
Anesthesia: Moderate Sedation

## 2014-12-11 MED ORDER — BUTAMBEN-TETRACAINE-BENZOCAINE 2-2-14 % EX AERO
INHALATION_SPRAY | CUTANEOUS | Status: DC | PRN
  Administered 2014-12-11: 2 via TOPICAL

## 2014-12-11 MED ORDER — FENTANYL CITRATE 0.05 MG/ML IJ SOLN
INTRAMUSCULAR | Status: AC
  Filled 2014-12-11: qty 4

## 2014-12-11 MED ORDER — FENTANYL CITRATE 0.05 MG/ML IJ SOLN
INTRAMUSCULAR | Status: DC | PRN
  Administered 2014-12-11 (×3): 25 ug via INTRAVENOUS

## 2014-12-11 MED ORDER — DIPHENHYDRAMINE HCL 50 MG/ML IJ SOLN
INTRAMUSCULAR | Status: AC
Start: 2014-12-11 — End: 2014-12-11
  Filled 2014-12-11: qty 1

## 2014-12-11 MED ORDER — MIDAZOLAM HCL 10 MG/2ML IJ SOLN
INTRAMUSCULAR | Status: DC | PRN
  Administered 2014-12-11 (×2): 2 mg via INTRAVENOUS
  Administered 2014-12-11: 1 mg via INTRAVENOUS

## 2014-12-11 MED ORDER — SODIUM CHLORIDE 0.9 % IV SOLN
INTRAVENOUS | Status: DC
  Administered 2014-12-11: 500 mL via INTRAVENOUS

## 2014-12-11 MED ORDER — MIDAZOLAM HCL 5 MG/ML IJ SOLN
INTRAMUSCULAR | Status: AC
  Filled 2014-12-11: qty 3

## 2014-12-11 NOTE — Discharge Instructions (Signed)
Colonoscopy, Care After °These instructions give you information on caring for yourself after your procedure. Your doctor may also give you more specific instructions. Call your doctor if you have any problems or questions after your procedure. °HOME CARE °· Do not drive for 24 hours. °· Do not sign important papers or use machinery for 24 hours. °· You may shower. °· You may go back to your usual activities, but go slower for the first 24 hours. °· Take rest breaks often during the first 24 hours. °· Walk around or use warm packs on your belly (abdomen) if you have belly cramping or gas. °· Drink enough fluids to keep your pee (urine) clear or pale yellow. °· Resume your normal diet. Avoid heavy or fried foods. °· Avoid drinking alcohol for 24 hours or as told by your doctor. °· Only take medicines as told by your doctor. °If a tissue sample (biopsy) was taken during the procedure:  °· Do not take aspirin or blood thinners for 7 days, or as told by your doctor. °· Do not drink alcohol for 7 days, or as told by your doctor. °· Eat soft foods for the first 24 hours. °GET HELP IF: °You still have a small amount of blood in your poop (stool) 2-3 days after the procedure. °GET HELP RIGHT AWAY IF: °· You have more than a small amount of blood in your poop. °· You see clumps of tissue (blood clots) in your poop. °· Your belly is puffy (swollen). °· You feel sick to your stomach (nauseous) or throw up (vomit). °· You have a fever. °· You have belly pain that gets worse and medicine does not help. °MAKE SURE YOU: °· Understand these instructions. °· Will watch your condition. °· Will get help right away if you are not doing well or get worse. °Document Released: 09/17/2010 Document Revised: 08/20/2013 Document Reviewed: 04/22/2013 °ExitCare® Patient Information ©2015 [employer]. This information is not intended to replace advice given to you by your health care provider. Make sure you discuss any questions you have with  your health care provider. ° ° °Esophagogastroduodenoscopy °Care After °Refer to this sheet in the next few weeks. These instructions provide you with information on caring for yourself after your procedure. Your caregiver may also give you more specific instructions. Your treatment has been planned according to current medical practices, but problems sometimes occur. Call your caregiver if you have any problems or questions after your procedure.  °HOME CARE INSTRUCTIONS °· Do not eat or drink anything until the numbing medicine (local anesthetic) has worn off and your gag reflex has returned. You will know that the local anesthetic has worn off when you can swallow comfortably. °· Do not drive for 12 hours after the procedure or as directed by your caregiver. °· Only take medicines as directed by your caregiver. °SEEK MEDICAL CARE IF:  °· You cannot stop coughing. °· You are not urinating at all or less than usual. °SEEK IMMEDIATE MEDICAL CARE IF: °· You have difficulty swallowing. °· You cannot eat or drink. °· You have worsening throat or chest pain. °· You have dizziness, lightheadedness, or you faint. °· You have nausea or vomiting. °· You have chills. °· You have a fever. °· You have severe abdominal pain. °· You have black, tarry, or bloody stools. °Document Released: 08/01/2012 Document Reviewed: 08/01/2012 °ExitCare® Patient Information ©2015 [employer]. This information is not intended to replace advice given to you by your health care provider. Make   sure you discuss any questions you have with your health care provider. ° ° °Conscious Sedation, Adult, Care After °Refer to this sheet in the next few weeks. These instructions provide you with information on caring for yourself after your procedure. Your health care provider may also give you more specific instructions. Your treatment has been planned according to current medical practices, but problems sometimes occur. Call your health care provider if  you have any problems or questions after your procedure. °WHAT TO EXPECT AFTER THE PROCEDURE  °After your procedure: °· You may feel sleepy, clumsy, and have poor balance for several hours. °· Vomiting may occur if you eat too soon after the procedure. °HOME CARE INSTRUCTIONS °· Do not participate in any activities where you could become injured for at least 24 hours. Do not: °¨ Drive. °¨ Swim. °¨ Ride a bicycle. °¨ Operate heavy machinery. °¨ Cook. °¨ Use power tools. °¨ Climb ladders. °¨ Work from a high place. °· Do not make important decisions or sign legal documents until you are improved. °· If you vomit, drink water, juice, or soup when you can drink without vomiting. Make sure you have little or no nausea before eating solid foods. °· Only take over-the-counter or prescription medicines for pain, discomfort, or fever as directed by your health care provider. °· Make sure you and your family fully understand everything about the medicines given to you, including what side effects may occur. °· You should not drink alcohol, take sleeping pills, or take medicines that cause drowsiness for at least 24 hours. °· If you smoke, do not smoke without supervision. °· If you are feeling better, you may resume normal activities 24 hours after you were sedated. °· Keep all appointments with your health care provider. °SEEK MEDICAL CARE IF: °· Your skin is pale or bluish in color. °· You continue to feel nauseous or vomit. °· Your pain is getting worse and is not helped by medicine. °· You have bleeding or swelling. °· You are still sleepy or feeling clumsy after 24 hours. °SEEK IMMEDIATE MEDICAL CARE IF: °· You develop a rash. °· You have difficulty breathing. °· You develop any type of allergic problem. °· You have a fever. °MAKE SURE YOU: °· Understand these instructions. °· Will watch your condition. °· Will get help right away if you are not doing well or get worse. °Document Released: 06/05/2013 Document Reviewed:  06/05/2013 °ExitCare® Patient Information ©2015 [employer]. This information is not intended to replace advice given to you by your health care provider. Make sure you discuss any questions you have with your health care provider. ° °

## 2014-12-11 NOTE — Op Note (Signed)
[facility] [place], [address]   ENDOSCOPY PROCEDURE REPORT  PATIENT: Dylan [name], Dylan [name]  MR#: [id number] BIRTHDATE: 03-13-1961 , 25  yrs. old GENDER: male ENDOSCOPIST: Inda Castle, MD REFERRED BY:  Boykin Nearing, MD PROCEDURE DATE:  12/11/2014 PROCEDURE:  EGD, diagnostic ASA CLASS:     Class II INDICATIONS:  screening for varices. history of cirrhosis MEDICATIONS: Monitored anesthesia care, Residual sedation present, Versed 1 mg IV, and Fentanyl 25 mcg IV TOPICAL ANESTHETIC: Cetacaine Spray  DESCRIPTION OF PROCEDURE: After the risks benefits and alternatives of the procedure were thoroughly explained, informed consent was obtained.  The Pentax Gastroscope [id number] endoscope was introduced through the mouth and advanced to the third portion of the duodenum , Without limitations.  The instrument was slowly withdrawn as the mucosa was fully examined.      EXAM: The esophagus and gastroesophageal junction were completely normal in appearance.  The stomach was entered and closely examined.The antrum, angularis, and lesser curvature were well visualized, including a retroflexed view of the cardia and fundus. The stomach wall was normally distensable.  The scope passed easily through the pylorus into the duodenum.  Retroflexed views revealed no abnormalities.     The scope was then withdrawn from the patient and the procedure completed.  COMPLICATIONS: There were no immediate complications.  ENDOSCOPIC IMPRESSION: Normal appearing esophagus and GE junction, the stomach was well visualized and normal in appearance, normal appearing duodenum  RECOMMENDATIONS: Endoscopy one year  REPEAT EXAM:  eSigned:  Inda Castle, MD 12/11/2014 12:11 PM    CC:
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
## 2014-12-11 NOTE — Op Note (Signed)
[facility] [place], [address]   COLONOSCOPY PROCEDURE REPORT  PATIENT: Dylan, [name]  MR#: [id number] BIRTHDATE: 1960/11/02 , 80  yrs. old GENDER: male ENDOSCOPIST: Inda Castle, MD REFERRED GB:[id number] Funches, MD PROCEDURE DATE:  12/11/2014 PROCEDURE:   Colonoscopy, screening First Screening Colonoscopy - Avg.  risk and is 50 yrs.  old or older Yes.  Prior Negative Screening - Now for repeat screening. N/A  History of Adenoma - Now for follow-up colonoscopy & has been > or = to 3 yrs.  N/A ASA CLASS:   Class II INDICATIONS:Colorectal Neoplasm Risk Assessment for this procedure is average risk. MEDICATIONS: Versed 4 mg IV and Fentanyl 50 mcg IV  DESCRIPTION OF PROCEDURE:   After the risks benefits and alternatives of the procedure were thoroughly explained, informed consent was obtained.  The digital rectal exam revealed no abnormalities of the rectum.   The Pentax Adult Colon [id number] endoscope was introduced through the anus and advanced to the cecum, which was identified by both the appendix and ileocecal valve. No adverse events experienced.   The quality of the prep was excellent.  (Suprep was used)  The instrument was then slowly withdrawn as the colon was fully examined.      COLON FINDINGS: A normal appearing cecum, ileocecal valve, and appendiceal orifice were identified.  The ascending, transverse, descending, sigmoid colon, and rectum appeared unremarkable. Retroflexed views revealed no abnormalities. The time to cecum = 1 minute Withdrawal time = 6 minutes   The scope was withdrawn and the procedure completed. COMPLICATIONS: There were no immediate complications.  ENDOSCOPIC IMPRESSION: Normal colonoscopy  RECOMMENDATIONS: Continue current colorectal screening recommendations for "routine risk" patients with a repeat colonoscopy in 10 years.  eSigned:  Inda Castle, MD 12/11/2014 12:09  PM   cc:

## 2014-12-11 NOTE — Interval H&P Note (Signed)
History and Physical Interval Note:  12/11/2014 11:03 AM  Shiela Mayer  has presented today for surgery, with the diagnosis of Abd pain, bloating,  cirrhosis  The various methods of treatment have been discussed with the patient and family. After consideration of risks, benefits and other options for treatment, the patient has consented to  Procedure(s): COLONOSCOPY WITH PROPOFOL (N/A) ESOPHAGOGASTRODUODENOSCOPY (EGD) WITH PROPOFOL (N/A) ESOPHAGEAL BANDING (N/A) as a surgical intervention .  The patient's history has been reviewed, patient examined, no change in status, stable for surgery.  I have reviewed the patient's chart and labs.  Questions were answered to the patient's satisfaction.     The recent H&P (dated **12/01/14*) was reviewed, the patient was examined and there is no change in the patients condition since that H&P was completed.   Erskine Emery  12/11/2014, 11:03 AM    Erskine Emery

## 2014-12-12 ENCOUNTER — Encounter (HOSPITAL_COMMUNITY): Payer: Self-pay | Admitting: Gastroenterology

## 2015-11-06 ENCOUNTER — Encounter: Payer: Self-pay | Admitting: Gastroenterology

## 2015-11-13 ENCOUNTER — Encounter: Payer: Self-pay | Admitting: Gastroenterology

## 2015-11-14 ENCOUNTER — Encounter (HOSPITAL_COMMUNITY): Payer: Self-pay | Admitting: *Deleted

## 2015-11-14 ENCOUNTER — Emergency Department (HOSPITAL_COMMUNITY)
Admission: EM | Admit: 2015-11-14 | Discharge: 2015-11-14 | Disposition: A | Discharge status: Home / Self Care | Payer: Self-pay | Attending: Emergency Medicine | Admitting: Emergency Medicine

## 2015-11-14 DIAGNOSIS — Z79899 Other long term (current) drug therapy: Secondary | ICD-10-CM | POA: Insufficient documentation

## 2015-11-14 DIAGNOSIS — S0502XA Injury of conjunctiva and corneal abrasion without foreign body, left eye, initial encounter: Secondary | ICD-10-CM | POA: Insufficient documentation

## 2015-11-14 DIAGNOSIS — J449 Chronic obstructive pulmonary disease, unspecified: Secondary | ICD-10-CM | POA: Insufficient documentation

## 2015-11-14 DIAGNOSIS — Z8719 Personal history of other diseases of the digestive system: Secondary | ICD-10-CM | POA: Insufficient documentation

## 2015-11-14 DIAGNOSIS — Z8619 Personal history of other infectious and parasitic diseases: Secondary | ICD-10-CM | POA: Insufficient documentation

## 2015-11-14 DIAGNOSIS — Z7951 Long term (current) use of inhaled steroids: Secondary | ICD-10-CM | POA: Insufficient documentation

## 2015-11-14 DIAGNOSIS — Z87891 Personal history of nicotine dependence: Secondary | ICD-10-CM | POA: Insufficient documentation

## 2015-11-14 DIAGNOSIS — Y9389 Activity, other specified: Secondary | ICD-10-CM | POA: Insufficient documentation

## 2015-11-14 DIAGNOSIS — X58XXXA Exposure to other specified factors, initial encounter: Secondary | ICD-10-CM | POA: Insufficient documentation

## 2015-11-14 DIAGNOSIS — Y998 Other external cause status: Secondary | ICD-10-CM | POA: Insufficient documentation

## 2015-11-14 DIAGNOSIS — Y9289 Other specified places as the place of occurrence of the external cause: Secondary | ICD-10-CM | POA: Insufficient documentation

## 2015-11-14 MED ORDER — POLYMYXIN B-TRIMETHOPRIM 10000-0.1 UNIT/ML-% OP SOLN
1.0000 [drp] | OPHTHALMIC | Status: DC
  Administered 2015-11-14: 1 [drp] via OPHTHALMIC
  Filled 2015-11-14: qty 10

## 2015-11-14 MED ORDER — FLUORESCEIN SODIUM 1 MG OP STRP
1.0000 | ORAL_STRIP | Freq: Once | OPHTHALMIC | Status: AC
  Administered 2015-11-14: 1 via OPHTHALMIC
  Filled 2015-11-14: qty 1

## 2015-11-14 MED ORDER — CYCLOPENTOLATE HCL 1 % OP SOLN: 1.0000 [drp] | Freq: Once | OPHTHALMIC | Status: DC

## 2015-11-14 MED ORDER — TETRACAINE HCL 0.5 % OP SOLN
1.0000 [drp] | Freq: Once | OPHTHALMIC | Status: AC
  Administered 2015-11-14: 1 [drp] via OPHTHALMIC
  Filled 2015-11-14: qty 4

## 2015-11-14 MED ORDER — KETOROLAC TROMETHAMINE 0.5 % OP SOLN
1.0000 [drp] | Freq: Four times a day (QID) | OPHTHALMIC | Status: DC | PRN
  Administered 2015-11-14: 1 [drp] via OPHTHALMIC
  Filled 2015-11-14: qty 3

## 2015-11-14 MED ORDER — ERYTHROMYCIN 5 MG/GM OP OINT: 1.0000 "application " | TOPICAL_OINTMENT | Freq: Four times a day (QID) | OPHTHALMIC | Status: AC

## 2015-11-14 NOTE — Discharge Instructions (Signed)

## 2015-11-14 NOTE — ED Provider Notes (Signed)
CSN: [id number]     Arrival date & time 11/14/15  1603 History  By signing my name below, I, Sonum Patel, attest that this documentation has been prepared under the direction and in the presence of Cyris Maalouf PA-C. Electronically Signed: Sonum Patel, Scribe. 11/14/2015. 5:51 PM.    Chief Complaint  Patient presents with  . Eye Problem    The history is provided by the patient. No language interpreter was used.     HPI Comments: Dylan [name] is a 55 y.o. male who presents to the Emergency Department complaining of 3 days of gradual onset left eye irritation that has gradually worsened to entire eye redness and pain. He states it feels as though there might be a foreign body but states he does not recall what he was doing when the irritation began.   The redness started on the white part of his left eye only on the nasal side.  After the second day of frequent rubbing he had redness to his whole eye.  He has the sensation of something cratchy in his eye.  He has associated clear drainage from the left eye and photophobia. He reports having difficulty with vision when driving since this began. He denies similar symptoms in the past. He denies contact lenses or eyeglasses. He denies otalgia, rhinorrhea, N, V, fever.   Past Medical History  Diagnosis Date  . Smoker     1/2 ppd for 40 years  . Hepatitis C     3 years  . Cirrhosis ([place])   . COPD (chronic obstructive pulmonary disease) ([place])     1 year  . PONV (postoperative nausea and vomiting)    Past Surgical History  Procedure Laterality Date  . Knee surgery    . Colonoscopy with propofol N/A 12/11/2014    Procedure: COLONOSCOPY WITH PROPOFOL;  Surgeon: Inda Castle, MD;  Location: [facility];  Service: Endoscopy;  Laterality: N/A;  . Esophagogastroduodenoscopy (egd) with propofol N/A 12/11/2014    Procedure: ESOPHAGOGASTRODUODENOSCOPY (EGD) WITH PROPOFOL;  Surgeon: Inda Castle, MD;  Location: [facility];  Service: Endoscopy;   Laterality: N/A;  . Esophageal banding N/A 12/11/2014    Procedure: ESOPHAGEAL BANDING;  Surgeon: Inda Castle, MD;  Location: [facility];  Service: Endoscopy;  Laterality: N/A;   Family History  Problem Relation Age of Onset  . Diabetes Mother   . Diabetes Maternal Grandmother    Social History  Substance Use Topics  . Smoking status: Former Smoker -- 0.50 packs/day for 40 years    Types: Cigarettes    Start date: 04/11/2014  . Smokeless tobacco: None  . Alcohol Use: 0.0 oz/week    0 Standard drinks or equivalent per week     Comment: occasionally    Review of Systems  HENT: Negative for ear pain and rhinorrhea.   Eyes: Positive for photophobia, pain (left), discharge (clear, left), redness (left) and visual disturbance.    Allergies  Other  Home Medications   Prior to Admission medications   Medication Sig Start Date End Date Taking? Authorizing Provider  albuterol (PROVENTIL HFA;VENTOLIN HFA) 108 (90 BASE) MCG/ACT inhaler Inhale 2 puffs into the lungs every 6 (six) hours as needed for wheezing or shortness of breath. 04/21/14   Josalyn Funches, MD  albuterol (PROVENTIL) (2.5 MG/3ML) 0.083% nebulizer solution Take 3 mLs (2.5 mg total) by nebulization every 4 (four) hours as needed for wheezing or shortness of breath. 04/13/14   Blain Pais, MD  cyclobenzaprine (FLEXERIL)  10 MG tablet Take 1 tablet (10 mg total) by mouth 3 (three) times daily as needed for muscle spasms. 11/12/14   Josalyn Funches, MD  loratadine (CLARITIN) 10 MG tablet Take 1 tablet (10 mg total) by mouth daily. 04/13/14   Blain Pais, MD  mometasone-formoterol (DULERA) 200-5 MCG/ACT AERO Inhale 2 puffs into the lungs 2 (two) times daily. 04/13/14   Blain Pais, MD  Multiple Vitamin (MULTIVITAMIN WITH MINERALS) TABS tablet Take 1 tablet by mouth daily.    Historical Provider, MD  pantoprazole (PROTONIX) 40 MG tablet Take 1 tablet (40 mg total) by mouth daily. 11/12/14   Josalyn Funches,  MD  traMADol (ULTRAM) 50 MG tablet Take 1 tablet (50 mg total) by mouth 2 (two) times daily. 11/12/14   Boykin Nearing, MD   There were no vitals taken for this visit. Physical Exam  Constitutional: He is oriented to person, place, and time. He appears well-developed and well-nourished. No distress.  HENT:  Head: Normocephalic and atraumatic.  Right Ear: External ear normal.  Left Ear: External ear normal.  Nose: Nose normal.  Mouth/Throat: Oropharynx is clear and moist. No oropharyngeal exudate.  Eyes: EOM and lids are normal. Pupils are equal, round, and reactive to light. Right eye exhibits no chemosis, no discharge and no hordeolum. Left eye exhibits no chemosis, no discharge and no exudate. Right conjunctiva is not injected. Right conjunctiva has no hemorrhage. Left conjunctiva is injected. Left conjunctiva has no hemorrhage. No scleral icterus. Right eye exhibits normal extraocular motion and no nystagmus. Left eye exhibits normal extraocular motion and no nystagmus.  Slit lamp exam:      The left eye shows fluorescein uptake.      Visual Acuity  Right Eye Distance: 25/50 Left Eye Distance: 25/40 Bilateral Distance: 25/50   Neck: Normal range of motion. Neck supple. No JVD present. No tracheal deviation present.  Cardiovascular: Normal rate and regular rhythm.   Pulmonary/Chest: Effort normal and breath sounds normal. No stridor. No respiratory distress.  Abdominal: He exhibits no distension.  Musculoskeletal: Normal range of motion. He exhibits no edema.  Lymphadenopathy:    He has no cervical adenopathy.  Neurological: He is alert and oriented to person, place, and time. He exhibits normal muscle tone. Coordination normal.  Skin: Skin is warm and dry. No rash noted. He is not diaphoretic. No erythema. No pallor.  Psychiatric: He has a normal mood and affect. His behavior is normal. Judgment and thought content normal.  Nursing note and vitals reviewed.   ED Course   Procedures (including critical care time)  DIAGNOSTIC STUDIES: Oxygen Saturation is 100% on room air, normal by my interpretation.    COORDINATION OF CARE: 5:58 PM Discussed treatment plan with pt at bedside and pt agreed to plan.   Labs Review Labs Reviewed - No data to display  Imaging Review No results found.     EKG Interpretation None      MDM   Corneal abrasion  Pt with corneal abrasion on PE.  Eye irrigated w NS.  No change in vision, acuity equal bilaterally.  Pt is not a contact lens wearer.  Exam non-concerning for orbital cellulitis, hyphema, corneal ulcers. Patient will be discharged home with polytrim.  Pt provided eye drops for pain meds, encouraged to rest eye.    Patient understands to follow up with ophthalmology, & to return to ER if new symptoms develop including change in vision, purulent drainage, or entrapment.    Final diagnoses:  Corneal abrasion, left, initial encounter    I personally performed the services described in this documentation, which was scribed in my presence. The recorded information has been reviewed and is accurate.     Delsa Grana, PA-C 11/17/15 [place], MD 11/21/15 [phone]

## 2015-11-14 NOTE — ED Notes (Signed)
Pt stated "this has been going on with my left for 3 days.  It feels like something's in it."

## 2015-12-31 ENCOUNTER — Encounter: Payer: Self-pay | Admitting: *Deleted

## 2016-01-07 ENCOUNTER — Other Ambulatory Visit (INDEPENDENT_AMBULATORY_CARE_PROVIDER_SITE_OTHER): Payer: Self-pay

## 2016-01-07 ENCOUNTER — Encounter: Payer: Self-pay | Admitting: Gastroenterology

## 2016-01-07 ENCOUNTER — Ambulatory Visit (INDEPENDENT_AMBULATORY_CARE_PROVIDER_SITE_OTHER): Payer: Self-pay | Admitting: Gastroenterology

## 2016-01-07 VITALS — BP 116/80 | HR 80 | Ht 65.75 in | Wt 153.0 lb

## 2016-01-07 DIAGNOSIS — L309 Dermatitis, unspecified: Secondary | ICD-10-CM

## 2016-01-07 DIAGNOSIS — K7460 Unspecified cirrhosis of liver: Secondary | ICD-10-CM

## 2016-01-07 LAB — CBC WITH DIFFERENTIAL/PLATELET
Basophils Absolute: 0 10*3/uL (ref 0.0–0.1)
Basophils Relative: 0.7 % (ref 0.0–3.0)
Eosinophils Absolute: 0.1 10*3/uL (ref 0.0–0.7)
Eosinophils Relative: 2.7 % (ref 0.0–5.0)
HCT: 46.7 % (ref 39.0–52.0)
HEMOGLOBIN: 15.8 g/dL (ref 13.0–17.0)
LYMPHS ABS: 1.3 10*3/uL (ref 0.7–4.0)
Lymphocytes Relative: 39.1 % (ref 12.0–46.0)
MCHC: 33.9 g/dL (ref 30.0–36.0)
MCV: 92.3 fl (ref 78.0–100.0)
MONO ABS: 0.4 10*3/uL (ref 0.1–1.0)
MONOS PCT: 11.6 % (ref 3.0–12.0)
NEUTROS PCT: 45.9 % (ref 43.0–77.0)
Neutro Abs: 1.6 10*3/uL (ref 1.4–7.7)
Platelets: 147 10*3/uL — ABNORMAL LOW (ref 150.0–400.0)
RBC: 5.06 Mil/uL (ref 4.22–5.81)
RDW: 13.5 % (ref 11.5–15.5)
WBC: 3.4 10*3/uL — AB (ref 4.0–10.5)

## 2016-01-07 LAB — COMPREHENSIVE METABOLIC PANEL
ALT: 19 U/L (ref 0–53)
AST: 25 U/L (ref 0–37)
Albumin: 4.6 g/dL (ref 3.5–5.2)
Alkaline Phosphatase: 57 U/L (ref 39–117)
BUN: 16 mg/dL (ref 6–23)
CO2: 29 mEq/L (ref 19–32)
CREATININE: 1.12 mg/dL (ref 0.40–1.50)
Calcium: 9.6 mg/dL (ref 8.4–10.5)
Chloride: 103 mEq/L (ref 96–112)
GFR: 87.15 mL/min (ref >60.00)
GLUCOSE: 94 mg/dL (ref 70–99)
Potassium: 4.3 mEq/L (ref 3.5–5.1)
Sodium: 141 mEq/L (ref 135–145)
Total Bilirubin: 1.3 mg/dL — ABNORMAL HIGH (ref 0.2–1.2)
Total Protein: 7.9 g/dL (ref 6.0–8.3)

## 2016-01-07 MED ORDER — TRIAMCINOLONE ACETONIDE 0.1 % EX OINT: 1.0000 "application " | TOPICAL_OINTMENT | Freq: Two times a day (BID) | CUTANEOUS | Status: DC

## 2016-01-07 NOTE — Progress Notes (Signed)
HPI :  55 y/o male here for follow up for cirrhosis, previously seen by Dr. Deatra Ina.. Last seen a year ago.   He has a history of hepatitis C, treated in the past and resolved. He also has a history of significant alcohol use. He has been doing okay in the past year or so. He reports continued alcohol use but he has one drink once per month or so. Minimal use. No edema of the legs or abdomen. Weight is stable. No FH of known liver disease. No FH of liver cancer. He has no jaundice. No history of ascites. No history of encephalopathy. He has a history of grade I varices in 2011, and then repeat EGD 11/2014, which showed resolution of the varices and it was normal.    He takes rare ibuprofen. No routine NSAID use. He had a MRI of the liver last year, he has had imaging to follow "nodules" over recent years.   He otherwise endorses ongoing rash of his lower extremities bilaterally which is pruritic and bothers him. Longstanding.   Last colonoscopy 11/2014 was normal without colon polyps.  Past Medical History  Diagnosis Date  . Smoker     1/2 ppd for 40 years  . Hepatitis C     3 years  . Cirrhosis ([place])   . COPD (chronic obstructive pulmonary disease) ([place])     1 year  . PONV (postoperative nausea and vomiting)   . Cholelithiasis      Past Surgical History  Procedure Laterality Date  . Knee arthroscopy Right   . Colonoscopy with propofol N/A 12/11/2014    Procedure: COLONOSCOPY WITH PROPOFOL;  Surgeon: Inda Castle, MD;  Location: [facility];  Service: Endoscopy;  Laterality: N/A;  . Esophagogastroduodenoscopy (egd) with propofol N/A 12/11/2014    Procedure: ESOPHAGOGASTRODUODENOSCOPY (EGD) WITH PROPOFOL;  Surgeon: Inda Castle, MD;  Location: [facility];  Service: Endoscopy;  Laterality: N/A;  . Esophageal banding N/A 12/11/2014    Procedure: ESOPHAGEAL BANDING;  Surgeon: Inda Castle, MD;  Location: [facility];  Service: Endoscopy;  Laterality: N/A;   Family History    Problem Relation Age of Onset  . Diabetes Mother   . Diabetes Maternal Grandmother   . Mesothelioma Father   . Alzheimer's disease Mother    Social History  Substance Use Topics  . Smoking status: Former Smoker -- 0.50 packs/day for 40 years    Types: Cigarettes    Quit date: 04/11/2014  . Smokeless tobacco: Never Used  . Alcohol Use: 0.0 oz/week    0 Standard drinks or equivalent per week     Comment: occasionally   No current outpatient prescriptions on file.   No current facility-administered medications for this visit.   Allergies  Allergen Reactions  . Other Nausea And Vomiting    Narcotics- pt states he does not like to take narcotics d/t makes him sick     Review of Systems: All systems reviewed and negative except where noted in HPI.   Lab Results  Component Value Date   WBC 3.4* 01/07/2016   HGB 15.8 01/07/2016   HCT 46.7 01/07/2016   MCV 92.3 01/07/2016   PLT 147.0* 01/07/2016    Lab Results  Component Value Date   ALT 19 01/07/2016   AST 25 01/07/2016   ALKPHOS 57 01/07/2016   BILITOT 1.3* 01/07/2016    Lab Results  Component Value Date   CREATININE 1.12 01/07/2016   BUN 16 01/07/2016   NA 141  01/07/2016   K 4.3 01/07/2016   CL 103 01/07/2016   CO2 29 01/07/2016   Hep C RNA negative 10/2014  Physical Exam: BP 116/80 mmHg  Pulse 80  Ht 5' 5.75" (1.67 m)  Wt 153 lb (69.4 kg)  BMI 24.88 kg/m2 Constitutional: Pleasant,well-developed, male in no acute distress. HEENT: Normocephalic and atraumatic. Conjunctivae are normal. No scleral icterus. Neck supple.  Cardiovascular: Normal rate, regular rhythm.  Pulmonary/chest: Effort normal and breath sounds normal. No wheezing, rales or rhonchi. Abdominal: Soft, nondistended, nontender. Bowel sounds active throughout. There are no masses palpable. No hepatomegaly. Extremities: no edema Lymphadenopathy: No cervical adenopathy noted. Neurological: Alert and oriented to person place and time. No  asterixis Skin: Skin is warm and dry. Dermatitis on lower extremity bilaterally Psychiatric: Normal mood and affect. Behavior is normal.   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 He has a remote history of small esophageal varices, which were not seen on the last EGD in 2016. He has not had any other decompensations to date.  Cirrhosis - compensated at this time. Discussed need to completely abstain from alcohol. CBC and CMP obtained today which look good. Will send hep A/B ABs to check for immunity and vaccinate if needed. Otherwise will refer for MRI liver with AFP level for [place] screening. Given his last EGD was normal and with compensated cirrhosis, okay to wait to do EGD every 2 years per AASLD guidelines, unless varices are noted on MRI. He should be seen every 6 months and have [place] screening done every 6 months. He agreed.   Dermatitis of lower extremities, suspect eczema. Will treat with triamcinolone ointment. If no improvement will need to see primary care or dermatology.   Millington Cellar, MD [facility] Pager [phone]

## 2016-01-07 NOTE — Patient Instructions (Signed)
Your physician has requested that you go to the basement for the following lab work before leaving today: CBC, CMET, AFP, Hepatitis A, Hepatitis B  We have sent the following medications to your pharmacy for you to pick up at your convenience: Triamcinolone ointment 0.1%-Apply 2 times daily x 1 week to rash  You have been scheduled for an MRI of the liver at [facility] on Thursday, 01/14/16. Your appointment time is 8:00 am. Please arrive 15 minutes prior to your appointment time for registration purposes. Please make certain not to have anything to eat or drink 6 hours prior to your test. In addition, if you have any metal in your body, have a pacemaker or defibrillator, please be sure to let your ordering physician know. This test typically takes 45 minutes to 1 hour to complete.  If you are age 43 or older, your body mass index should be between 23-30. Your Body mass index is 24.88 kg/(m^2). If this is out of the aforementioned range listed, please consider follow up with your Primary Care Provider.  If you are age 28 or younger, your body mass index should be between 19-25. Your Body mass index is 24.88 kg/(m^2). If this is out of the aformentioned range listed, please consider follow up with your Primary Care Provider.

## 2016-01-08 ENCOUNTER — Other Ambulatory Visit: Payer: Self-pay | Admitting: *Deleted

## 2016-01-08 DIAGNOSIS — K7460 Unspecified cirrhosis of liver: Secondary | ICD-10-CM

## 2016-01-08 LAB — AFP TUMOR MARKER: AFP TUMOR MARKER: 7.4 ng/mL — AB (ref <6.1)

## 2016-01-08 LAB — HEPATITIS A ANTIBODY, TOTAL: HEP A TOTAL AB: NONREACTIVE

## 2016-01-08 LAB — HEPATITIS B SURFACE ANTIBODY,QUALITATIVE: HEP B S AB: POSITIVE — AB

## 2016-01-11 ENCOUNTER — Telehealth: Payer: Self-pay | Admitting: *Deleted

## 2016-01-11 NOTE — Telephone Encounter (Signed)
-----   Message from Larina Bras, [place] sent at 01/11/2016 10:55 AM EDT ----- FYI-patient did not come for hepatitis injections on 01/11/16!

## 2016-01-11 NOTE — Telephone Encounter (Signed)
Left a message for patient to call back. 

## 2016-01-13 NOTE — Telephone Encounter (Signed)
Noted  

## 2016-01-13 NOTE — Telephone Encounter (Signed)
Dylan [name] returned call. I have rescheduled his Hep Injection for this Friday 01-15-16.

## 2016-01-14 ENCOUNTER — Ambulatory Visit (HOSPITAL_COMMUNITY)
Admission: RE | Admit: 2016-01-14 | Discharge: 2016-01-14 | Disposition: A | Discharge status: Home / Self Care | Payer: Self-pay | Source: Ambulatory Visit | Attending: Gastroenterology | Admitting: Gastroenterology

## 2016-01-14 DIAGNOSIS — I7090 Unspecified atherosclerosis: Secondary | ICD-10-CM | POA: Insufficient documentation

## 2016-01-14 DIAGNOSIS — K7460 Unspecified cirrhosis of liver: Secondary | ICD-10-CM | POA: Insufficient documentation

## 2016-01-14 DIAGNOSIS — K8020 Calculus of gallbladder without cholecystitis without obstruction: Secondary | ICD-10-CM | POA: Insufficient documentation

## 2016-01-14 MED ORDER — GADOXETATE DISODIUM 0.25 MMOL/ML IV SOLN
8.0000 mL | Freq: Once | INTRAVENOUS | Status: AC | PRN
  Administered 2016-01-14: 8 mL via INTRAVENOUS

## 2016-07-18 ENCOUNTER — Other Ambulatory Visit: Payer: Self-pay

## 2016-07-18 ENCOUNTER — Telehealth: Payer: Self-pay

## 2016-07-18 DIAGNOSIS — K7460 Unspecified cirrhosis of liver: Secondary | ICD-10-CM

## 2016-07-18 NOTE — Telephone Encounter (Signed)
Left message for patient to call back. It is time to set up a follow up ultrasound of his abdomen (cirrhosis) and set up an office visit.

## 2016-07-18 NOTE — Telephone Encounter (Signed)
Spoke to patient and he is scheduled for a 6 month follow up ultrasound of abdomen at [facility] 11/28 arrive at 7:45 am for 8:00 am test, NPO after midnight. He is also scheduled with Dr. Havery Moros on 09/06/16 at 8:45 am for office visit.

## 2016-07-26 ENCOUNTER — Encounter (HOSPITAL_COMMUNITY): Payer: Self-pay

## 2016-07-26 ENCOUNTER — Ambulatory Visit (HOSPITAL_COMMUNITY)
Admission: RE | Admit: 2016-07-26 | Discharge: 2016-07-26 | Disposition: A | Discharge status: Home / Self Care | Payer: Self-pay | Source: Ambulatory Visit | Attending: Gastroenterology | Admitting: Gastroenterology

## 2016-07-26 DIAGNOSIS — K8020 Calculus of gallbladder without cholecystitis without obstruction: Secondary | ICD-10-CM | POA: Insufficient documentation

## 2016-07-26 DIAGNOSIS — K7460 Unspecified cirrhosis of liver: Secondary | ICD-10-CM | POA: Insufficient documentation

## 2016-07-27 ENCOUNTER — Telehealth: Payer: Self-pay

## 2016-07-27 NOTE — Telephone Encounter (Signed)
Informed pt of u/s results. He states that he does have pain from the gallstones and that he would like to be seen before his 1/9 appt with you.

## 2016-07-27 NOTE — Telephone Encounter (Signed)
-----   Message from Manus Gunning, MD sent at 07/26/2016 12:42 PM EST ----- Caryl Pina can you please relay the results of [place] to the patient: - stable changes of cirrhosis - multiple gallstones noted which were seen previously. Unless he is having pain concerning for gallstones likely does not need an intervention. I will discuss this further with him during his follow up - small possible kidney stone  He is scheduled to see me in January for follow up. If he has any symptoms in the interim he can let us know. Thanks

## 2016-07-29 NOTE — Telephone Encounter (Signed)
Called and spoke with patient. I discussed US findings with him. He has intermittent LLQ pain, no upper abdominal pains. I don't think he is having symptoms from gallstones. I would like to see him in clinic to evaluate him further to help clarify etiology of his pain. He is booked for January. Can you please call the patient and see if we can find him an appointment this month to be seen. We can discuss my schedule. Thanks

## 2016-08-02 ENCOUNTER — Telehealth: Payer: Self-pay

## 2016-08-02 NOTE — Telephone Encounter (Signed)
Dylan [name] up front will make a note for everyone making appts to put this pt in a cancellation spot for Dr Havery Moros.

## 2016-08-02 NOTE — Telephone Encounter (Signed)
I'm sorry I over looked this message. Just tell me what day and time and I will put him in the schedule. This Thursday your last pt is a 3. Can he be schedule that afternoon or there are some days the following week?

## 2016-08-02 NOTE — Telephone Encounter (Signed)
Per Dr Havery Moros pt can be worked in. The girls up front will put pt in the schedule when Dr Havery Moros has a cancellation in the upcoming days/week.

## 2016-08-02 NOTE — Telephone Encounter (Signed)
This Thursday I only have clinic in the AM and we are full. Can you put him on a call list. If we have any cancellations we can add him into the schedule, I don't see any openings this month. Thanks

## 2016-08-04 ENCOUNTER — Encounter: Payer: Self-pay | Admitting: Gastroenterology

## 2016-08-04 ENCOUNTER — Other Ambulatory Visit (INDEPENDENT_AMBULATORY_CARE_PROVIDER_SITE_OTHER): Payer: Self-pay

## 2016-08-04 ENCOUNTER — Ambulatory Visit (INDEPENDENT_AMBULATORY_CARE_PROVIDER_SITE_OTHER): Payer: Self-pay | Admitting: Gastroenterology

## 2016-08-04 VITALS — BP 128/80 | HR 66 | Ht 66.0 in | Wt 160.0 lb

## 2016-08-04 DIAGNOSIS — R10A0 Flank pain, unspecified side: Secondary | ICD-10-CM

## 2016-08-04 DIAGNOSIS — K7460 Unspecified cirrhosis of liver: Secondary | ICD-10-CM

## 2016-08-04 DIAGNOSIS — R109 Unspecified abdominal pain: Secondary | ICD-10-CM

## 2016-08-04 DIAGNOSIS — R1012 Left upper quadrant pain: Secondary | ICD-10-CM

## 2016-08-04 LAB — CBC WITH DIFFERENTIAL/PLATELET
BASOS ABS: 0 10*3/uL (ref 0.0–0.1)
Basophils Relative: 0.4 % (ref 0.0–3.0)
EOS ABS: 0.1 10*3/uL (ref 0.0–0.7)
Eosinophils Relative: 2.8 % (ref 0.0–5.0)
HCT: 46.9 % (ref 39.0–52.0)
Hemoglobin: 16.1 g/dL (ref 13.0–17.0)
LYMPHS ABS: 1.1 10*3/uL (ref 0.7–4.0)
LYMPHS PCT: 33.8 % (ref 12.0–46.0)
MCHC: 34.4 g/dL (ref 30.0–36.0)
MCV: 92.7 fl (ref 78.0–100.0)
Monocytes Absolute: 0.4 10*3/uL (ref 0.1–1.0)
Monocytes Relative: 11.9 % (ref 3.0–12.0)
NEUTROS ABS: 1.7 10*3/uL (ref 1.4–7.7)
NEUTROS PCT: 51.1 % (ref 43.0–77.0)
PLATELETS: 160 10*3/uL (ref 150.0–400.0)
RBC: 5.07 Mil/uL (ref 4.22–5.81)
RDW: 14 % (ref 11.5–15.5)
WBC: 3.4 10*3/uL — ABNORMAL LOW (ref 4.0–10.5)

## 2016-08-04 LAB — COMPREHENSIVE METABOLIC PANEL
ALT: 21 U/L (ref 0–53)
AST: 25 U/L (ref 0–37)
Albumin: 4.5 g/dL (ref 3.5–5.2)
Alkaline Phosphatase: 59 U/L (ref 39–117)
BILIRUBIN TOTAL: 1.1 mg/dL (ref 0.2–1.2)
BUN: 7 mg/dL (ref 6–23)
CO2: 31 meq/L (ref 19–32)
CREATININE: 1.11 mg/dL (ref 0.40–1.50)
Calcium: 9.6 mg/dL (ref 8.4–10.5)
Chloride: 104 mEq/L (ref 96–112)
GFR: 87.87 mL/min (ref >60.00)
GLUCOSE: 94 mg/dL (ref 70–99)
Potassium: 4.6 mEq/L (ref 3.5–5.1)
SODIUM: 141 meq/L (ref 135–145)
Total Protein: 8 g/dL (ref 6.0–8.3)

## 2016-08-04 LAB — PROTIME-INR
INR: 1.1 ratio — ABNORMAL HIGH (ref 0.8–1.0)
Prothrombin Time: 11.4 s (ref 9.6–13.1)

## 2016-08-04 NOTE — Progress Notes (Signed)
HPI :  55 year old male here for follow-up for cirrhosis. History of hepatitis C, treated in the past with resolution. Also with history of heavy alcohol use. History of grade 1 varices in 2011 with repeat EGD on April 2016 which showed no varices.  He is here for a follow-up visit. He had an ultrasound recently which showed cirrhotic liver, no new findings, gallstones in the gallbladder. He denies any pain in the RUQ or epigastric area, no biliary colic. He has some constant pain in the left flank / abdomen. He reports this has been ongoing for 3 months or so, within the past few weeks getting worse. Pain is present 24/7. He rates it about 7/10. It's worse whenever he moves, usually after he lies down or twisting. He denies any injuries recently, or strenous activity. He feels pain in the left lower back. He denies any worsening of his pain with eating. Moving bowel habits regularly. He reports stools can be loose, sometimes normal form. No blood in the stools. Having a bowel movement does not take away the pain. He has been taking ibuprofen for his pain 1-2 times per day, which can help slightly with his pain. He denies any dysuria, no blood in the urine. He does drink alcohol, roughly 1 pint of liquer in a month, more than what he endorsed at the last visit.  MRI liver 01/14/16 - cirrhosis, no mass lesions, gallstones US liver 07/26/16 - gallstones, cirrhotic liver, possible renal stone on right  Last colonoscopy 11/2014 was normal without colon polyps.   Past Medical History:  Diagnosis Date  . Cholelithiasis   . Cirrhosis ([place])   . COPD (chronic obstructive pulmonary disease) ([place])    1 year  . Gallstones   . Hepatitis C    3 years  . PONV (postoperative nausea and vomiting)   . Smoker    1/2 ppd for 40 years     Past Surgical History:  Procedure Laterality Date  . COLONOSCOPY WITH PROPOFOL N/A 12/11/2014   Procedure: COLONOSCOPY WITH PROPOFOL;  Surgeon: Inda Castle, MD;   Location: [facility];  Service: Endoscopy;  Laterality: N/A;  . ESOPHAGEAL BANDING N/A 12/11/2014   Procedure: ESOPHAGEAL BANDING;  Surgeon: Inda Castle, MD;  Location: [facility];  Service: Endoscopy;  Laterality: N/A;  . ESOPHAGOGASTRODUODENOSCOPY (EGD) WITH PROPOFOL N/A 12/11/2014   Procedure: ESOPHAGOGASTRODUODENOSCOPY (EGD) WITH PROPOFOL;  Surgeon: Inda Castle, MD;  Location: [facility];  Service: Endoscopy;  Laterality: N/A;  . KNEE ARTHROSCOPY Right    Family History  Problem Relation Age of Onset  . Diabetes Mother   . Alzheimer's disease Mother   . Diabetes Maternal Grandmother   . Mesothelioma Father    Social History  Substance Use Topics  . Smoking status: Former Smoker    Packs/day: 0.50    Years: 40.00    Types: Cigarettes    Quit date: 04/11/2014  . Smokeless tobacco: Never Used  . Alcohol use 0.0 oz/week     Comment: occasionally   Current Outpatient Prescriptions  Medication Sig Dispense Refill  . triamcinolone ointment (KENALOG) 0.1 % Apply 1 application topically 2 (two) times daily. X 1 week to rash 30 g 0   No current facility-administered medications for this visit.    Allergies  Allergen Reactions  . Other Nausea And Vomiting    Narcotics- pt states he does not like to take narcotics d/t makes him sick     Review of Systems: All systems reviewed and  negative except where noted in HPI.    US Abdomen Complete  Result Date: 07/26/2016 CLINICAL DATA:  Left pain x4 months. Cirrhosis, hepatitis-C, cholelithiasis EXAM: COMPLETE ABDOMINAL ULTRASOUND COMPARISON:  MR 01/14/2016 and previous FINDINGS: Gallbladder: Multiple small spine stones in the dependent aspect of the gallbladder, measure up to 1 cm diameter. No wall thickening or pericholecystic fluid. Sonographer describes no sonographic Murphy's sign. Common bile duct:  Normal in caliber, 3.52mm diameter. Liver: Nodular contour, mildly heterogeneous echotexture. No discrete lesion or  intrahepatic biliary ductal dilatation. Patent antegrade flow in the portal vein. IVC:  Negative Pancreas:  Negative Spleen:  No focal lesion, craniocaudal 9.4cm in length. Right Kidney: 7 mm shadowing focus in the interpolar region of the renal collecting system. No mass or hydronephrosis, 10.8cm in length. Left Kidney:  No lesion or hydronephrosis, 11.1cm in length. Abdominal aorta:  Negative IMPRESSION: 1. Cholelithiasis without other ultrasound evidence of cholecystitis or biliary obstruction. 2. Nodular liver contour suggesting cirrhosis, without mass or other acute finding. 3. Right  possible nephrolithiasis, without hydronephrosis. Electronically Signed   By: Lucrezia Europe M.D.   On: 07/26/2016 08:50   Lab Results  Component Value Date   WBC 3.4 (L) 01/07/2016   HGB 15.8 01/07/2016   HCT 46.7 01/07/2016   MCV 92.3 01/07/2016   PLT 147.0 (L) 01/07/2016    Lab Results  Component Value Date   CREATININE 1.12 01/07/2016   BUN 16 01/07/2016   NA 141 01/07/2016   K 4.3 01/07/2016   CL 103 01/07/2016   CO2 29 01/07/2016    Lab Results  Component Value Date   ALT 19 01/07/2016   AST 25 01/07/2016   ALKPHOS 57 01/07/2016   BILITOT 1.3 (H) 01/07/2016    Lab Results  Component Value Date   INR 1.1 (H) 12/01/2014   INR 1.15 03/24/2011      Physical Exam: BP 128/80   Pulse 66   Ht 5\' 6"  (1.676 m)   Wt 160 lb (72.6 kg)   BMI 25.82 kg/m  Constitutional: Pleasant,well-developed, male in no acute distress. HEENT: Normocephalic and atraumatic. Conjunctivae are normal. No scleral icterus. Neck supple.  Cardiovascular: Normal rate, regular rhythm.  Pulmonary/chest: Effort normal and breath sounds normal. No wheezing, rales or rhonchi. Abdominal: Soft, nondistended, tednerness of the left flank and at costal margin, very tender over left ribs and into lower back. There are no masses palpable.  Extremities: no edema Lymphadenopathy: No cervical adenopathy noted. Neurological: Alert  and oriented to person place and time. Skin: Skin is warm and dry. No rashes noted. Psychiatric: Normal mood and affect. Behavior is normal.   ASSESSMENT AND PLAN: 55 year old male with a history of compensated cirrhosis, suspected due to hepatitis C +/- alcohol, here for a follow up visit to discuss the following issues:  Cirrhosis - stable, doing well, recommend the following: - Complete abstinence from alcohol, discussed this at length today - Basic labs, obtain AFP to complete HCC screening - Recall ultrasound in 6 months - Recall upper endoscopy for varices screening April 2018 - Follow-up in clinic in 6 months  Left flank pain/back pain - very likely musculoskeletal, tender on exam - We'll obtain rib series to ensure no fracture - Recommended he stop ibuprofen given his underlying liver disease, he can use Tylenol as needed - Trial of capcaisin cream, apply to the affected area 2 times a day - If symptoms persist despite these measures he will contact me  [place] Cellar, MD [facility]  Pager [phone]

## 2016-08-04 NOTE — Patient Instructions (Addendum)
If you are age 56 or older, your body mass index should be between 23-30. Your Body mass index is 25.82 kg/m. If this is out of the aforementioned range listed, please consider follow up with your Primary Care Provider.  If you are age 62 or younger, your body mass index should be between 19-25. Your Body mass index is 25.82 kg/m. If this is out of the aformentioned range listed, please consider follow up with your Primary Care Provider.   Your physician has requested that you go to the basement for lab work before leaving today.  Please stop Ibuprofen as directed.  Use Tylenol as needed.  Please purchase Capcaisin cream. Use as directed. This is an over the counter cream.  You will be contacted in 5 months to schedule a 6 month follow up appointment with Dr Teena Irani.  We have ordered an X-Ray to evaluate a possible rib fracture. You may go to [facility] at [facility]. There is no appointment needed.  Thank you.

## 2016-08-05 ENCOUNTER — Encounter: Payer: Self-pay | Admitting: Gastroenterology

## 2016-08-05 LAB — AFP TUMOR MARKER: AFP-Tumor Marker: 8.2 ng/mL — ABNORMAL HIGH

## 2016-08-09 NOTE — Progress Notes (Signed)
Letter mailed

## 2016-08-12 ENCOUNTER — Telehealth: Payer: Self-pay | Admitting: Gastroenterology

## 2016-08-12 NOTE — Telephone Encounter (Signed)
Went over instructions that were given to him at his visit. Instructed he can go to [facility] radiology, no appointment needed.

## 2016-09-06 ENCOUNTER — Ambulatory Visit: Payer: Self-pay | Admitting: Gastroenterology

## 2016-12-14 ENCOUNTER — Encounter: Payer: Self-pay | Admitting: Gastroenterology

## 2017-01-11 ENCOUNTER — Encounter: Payer: Self-pay | Admitting: Family Medicine

## 2017-01-27 ENCOUNTER — Other Ambulatory Visit: Payer: Self-pay

## 2017-01-27 ENCOUNTER — Telehealth: Payer: Self-pay

## 2017-01-27 DIAGNOSIS — K7460 Unspecified cirrhosis of liver: Secondary | ICD-10-CM

## 2017-01-27 NOTE — Telephone Encounter (Signed)
Patient advised of [place] scheduled and lab work that is due. He will come get this completed next week.

## 2017-02-02 ENCOUNTER — Ambulatory Visit (HOSPITAL_COMMUNITY): Admission: RE | Admit: 2017-02-02 | Payer: Self-pay | Source: Ambulatory Visit

## 2017-02-08 ENCOUNTER — Ambulatory Visit (HOSPITAL_COMMUNITY)
Admission: RE | Admit: 2017-02-08 | Discharge: 2017-02-08 | Disposition: A | Discharge status: Home / Self Care | Payer: Self-pay | Source: Ambulatory Visit | Attending: Gastroenterology | Admitting: Gastroenterology

## 2017-02-08 ENCOUNTER — Other Ambulatory Visit (INDEPENDENT_AMBULATORY_CARE_PROVIDER_SITE_OTHER): Payer: Self-pay

## 2017-02-08 DIAGNOSIS — K7460 Unspecified cirrhosis of liver: Secondary | ICD-10-CM

## 2017-02-08 DIAGNOSIS — K8020 Calculus of gallbladder without cholecystitis without obstruction: Secondary | ICD-10-CM | POA: Insufficient documentation

## 2017-02-08 LAB — CBC WITH DIFFERENTIAL/PLATELET
BASOS ABS: 0 10*3/uL (ref 0.0–0.1)
Basophils Relative: 0.7 % (ref 0.0–3.0)
Eosinophils Absolute: 0.1 10*3/uL (ref 0.0–0.7)
Eosinophils Relative: 2.2 % (ref 0.0–5.0)
HCT: 48.8 % (ref 39.0–52.0)
Hemoglobin: 16.3 g/dL (ref 13.0–17.0)
LYMPHS ABS: 1.2 10*3/uL (ref 0.7–4.0)
Lymphocytes Relative: 30.1 % (ref 12.0–46.0)
MCHC: 33.4 g/dL (ref 30.0–36.0)
MCV: 93.8 fl (ref 78.0–100.0)
MONO ABS: 0.5 10*3/uL (ref 0.1–1.0)
MONOS PCT: 11.4 % (ref 3.0–12.0)
NEUTROS PCT: 55.6 % (ref 43.0–77.0)
Neutro Abs: 2.2 10*3/uL (ref 1.4–7.7)
Platelets: 152 10*3/uL (ref 150.0–400.0)
RBC: 5.21 Mil/uL (ref 4.22–5.81)
RDW: 14.5 % (ref 11.5–15.5)
WBC: 4 10*3/uL (ref 4.0–10.5)

## 2017-02-08 LAB — COMPREHENSIVE METABOLIC PANEL
ALK PHOS: 65 U/L (ref 39–117)
ALT: 32 U/L (ref 0–53)
AST: 31 U/L (ref 0–37)
Albumin: 4.6 g/dL (ref 3.5–5.2)
BILIRUBIN TOTAL: 1.4 mg/dL — AB (ref 0.2–1.2)
BUN: 10 mg/dL (ref 6–23)
CO2: 31 mEq/L (ref 19–32)
CREATININE: 1.09 mg/dL (ref 0.40–1.50)
Calcium: 9.6 mg/dL (ref 8.4–10.5)
Chloride: 103 mEq/L (ref 96–112)
GFR: 89.57 mL/min (ref >60.00)
GLUCOSE: 100 mg/dL — AB (ref 70–99)
Potassium: 4.3 mEq/L (ref 3.5–5.1)
Sodium: 139 mEq/L (ref 135–145)
TOTAL PROTEIN: 8.1 g/dL (ref 6.0–8.3)

## 2017-02-08 LAB — PROTIME-INR
INR: 1.1 ratio — ABNORMAL HIGH (ref 0.8–1.0)
Prothrombin Time: 11.5 s (ref 9.6–13.1)

## 2017-02-09 ENCOUNTER — Other Ambulatory Visit: Payer: Self-pay

## 2017-02-09 DIAGNOSIS — K7460 Unspecified cirrhosis of liver: Secondary | ICD-10-CM

## 2017-02-09 LAB — AFP TUMOR MARKER: AFP TUMOR MARKER: 8.3 ng/mL — AB (ref <6.1)

## 2017-04-18 ENCOUNTER — Other Ambulatory Visit: Payer: Self-pay

## 2017-04-18 ENCOUNTER — Telehealth: Payer: Self-pay | Admitting: Gastroenterology

## 2017-04-18 NOTE — Telephone Encounter (Signed)
Spoke to patient and he is rescheduled for pre-visit and EGD, letter mailed with new dates/times of appointments. Patient most appreciative that we could reschedule.

## 2017-05-29 ENCOUNTER — Ambulatory Visit (AMBULATORY_SURGERY_CENTER): Payer: Self-pay | Admitting: *Deleted

## 2017-05-29 VITALS — Ht 66.0 in | Wt 160.0 lb

## 2017-05-29 DIAGNOSIS — Z8719 Personal history of other diseases of the digestive system: Secondary | ICD-10-CM

## 2017-05-29 NOTE — Progress Notes (Signed)
Patient denies any allergies to eggs or soy. Patient denies any problems with anesthesia/sedation. Patient denies any oxygen use at home. Patient denies taking any diet/weight loss medications or blood thinners. Pt declined emmi.

## 2017-05-31 ENCOUNTER — Encounter (HOSPITAL_COMMUNITY): Payer: Self-pay | Admitting: *Deleted

## 2017-06-05 ENCOUNTER — Other Ambulatory Visit: Payer: Self-pay

## 2017-06-06 ENCOUNTER — Ambulatory Visit (HOSPITAL_COMMUNITY): Payer: Self-pay | Admitting: Anesthesiology

## 2017-06-06 ENCOUNTER — Ambulatory Visit (HOSPITAL_COMMUNITY)
Admission: RE | Admit: 2017-06-06 | Discharge: 2017-06-06 | Disposition: A | Discharge status: Home / Self Care | Payer: Self-pay | Source: Ambulatory Visit | Attending: Gastroenterology | Admitting: Gastroenterology

## 2017-06-06 ENCOUNTER — Encounter (HOSPITAL_COMMUNITY): Payer: Self-pay

## 2017-06-06 ENCOUNTER — Encounter (HOSPITAL_COMMUNITY)
Admission: RE | Disposition: A | Discharge status: Home / Self Care | Payer: Self-pay | Source: Ambulatory Visit | Attending: Gastroenterology

## 2017-06-06 DIAGNOSIS — Z87891 Personal history of nicotine dependence: Secondary | ICD-10-CM | POA: Insufficient documentation

## 2017-06-06 DIAGNOSIS — J449 Chronic obstructive pulmonary disease, unspecified: Secondary | ICD-10-CM | POA: Insufficient documentation

## 2017-06-06 DIAGNOSIS — K7460 Unspecified cirrhosis of liver: Secondary | ICD-10-CM

## 2017-06-06 DIAGNOSIS — B1920 Unspecified viral hepatitis C without hepatic coma: Secondary | ICD-10-CM | POA: Insufficient documentation

## 2017-06-06 DIAGNOSIS — K449 Diaphragmatic hernia without obstruction or gangrene: Secondary | ICD-10-CM

## 2017-06-06 HISTORY — PX: ESOPHAGOGASTRODUODENOSCOPY: SHX5428

## 2017-06-06 SURGERY — EGD (ESOPHAGOGASTRODUODENOSCOPY)
Anesthesia: Monitor Anesthesia Care

## 2017-06-06 MED ORDER — LIDOCAINE 2% (20 MG/ML) 5 ML SYRINGE
INTRAMUSCULAR | Status: DC | PRN
  Administered 2017-06-06: 60 mg via INTRAVENOUS

## 2017-06-06 MED ORDER — LACTATED RINGERS IV SOLN
INTRAVENOUS | Status: DC
  Administered 2017-06-06: 1000 mL via INTRAVENOUS

## 2017-06-06 MED ORDER — PROPOFOL 10 MG/ML IV BOLUS
INTRAVENOUS | Status: AC
  Filled 2017-06-06: qty 40

## 2017-06-06 MED ORDER — LIDOCAINE 2% (20 MG/ML) 5 ML SYRINGE
INTRAMUSCULAR | Status: AC
  Filled 2017-06-06: qty 5

## 2017-06-06 MED ORDER — PROPOFOL 10 MG/ML IV BOLUS
INTRAVENOUS | Status: DC | PRN
  Administered 2017-06-06 (×2): 20 mg via INTRAVENOUS
  Administered 2017-06-06 (×2): 30 mg via INTRAVENOUS

## 2017-06-06 MED ORDER — ONDANSETRON HCL 4 MG/2ML IJ SOLN
INTRAMUSCULAR | Status: AC
  Filled 2017-06-06: qty 2

## 2017-06-06 MED ORDER — SODIUM CHLORIDE 0.9 % IV SOLN: INTRAVENOUS | Status: DC

## 2017-06-06 NOTE — Anesthesia Preprocedure Evaluation (Signed)
Anesthesia Evaluation  Patient identified by MRN, date of birth, ID band Patient awake    Reviewed: Allergy & Precautions, NPO status , Patient's Chart, lab work & pertinent test results  History of Anesthesia Complications (+) PONV  Airway Mallampati: I       Dental no notable dental hx. (+) Teeth Intact   Pulmonary COPD, former smoker,    Pulmonary exam normal breath sounds clear to auscultation       Cardiovascular  Rhythm:Regular Rate:Normal     Neuro/Psych negative neurological ROS  negative psych ROS   GI/Hepatic GERD  ,(+) Cirrhosis       , Hepatitis -, C  Endo/Other  negative endocrine ROS  Renal/GU      Musculoskeletal negative musculoskeletal ROS (+)   Abdominal Normal abdominal exam  (+)   Peds  Hematology   Anesthesia Other Findings   Reproductive/Obstetrics                             Anesthesia Physical Anesthesia Plan  ASA: II  Anesthesia Plan: MAC   Post-op Pain Management:    Induction: Intravenous  PONV Risk Score and Plan: 2 and Ondansetron, Dexamethasone and Scopolamine patch - Pre-op  Airway Management Planned: Mask  Additional Equipment:   Intra-op Plan:   Post-operative Plan:   Informed Consent: I have reviewed the patients History and Physical, chart, labs and discussed the procedure including the risks, benefits and alternatives for the proposed anesthesia with the patient or authorized representative who has indicated his/her understanding and acceptance.   Dental advisory given  Plan Discussed with: CRNA  Anesthesia Plan Comments:         Anesthesia Quick Evaluation

## 2017-06-06 NOTE — Anesthesia Postprocedure Evaluation (Signed)
Anesthesia Post Note  Patient: Dylan [name]  Procedure(s) Performed: ESOPHAGOGASTRODUODENOSCOPY (EGD) (N/A )     Patient location during evaluation: PACU Anesthesia Type: MAC Level of consciousness: awake and sedated Pain management: pain level controlled Vital Signs Assessment: post-procedure vital signs reviewed and stable Respiratory status: spontaneous breathing Cardiovascular status: stable Postop Assessment: no apparent nausea or vomiting Anesthetic complications: no    Last Vitals:  Vitals:   06/06/17 1138 06/06/17 1140  BP: (!) 139/92 138/83  Pulse: 92 78  Resp: 11 15  Temp: 36.5 C   SpO2: 100% 100%    Last Pain:  Vitals:   06/06/17 1138  TempSrc: Oral   Pain Goal:                 Aceton Kinnear JR,JOHN Cuinn Westerhold

## 2017-06-06 NOTE — Discharge Instructions (Signed)
YOU HAD AN ENDOSCOPIC PROCEDURE TODAY: Refer to the procedure report and other information in the discharge instructions given to you for any specific questions about what was found during the examination. If this information does not answer your questions, please call [place] office at [phone] to clarify.  ° °YOU SHOULD EXPECT: Some feelings of bloating in the abdomen. Passage of more gas than usual. Walking can help get rid of the air that was put into your GI tract during the procedure and reduce the bloating. If you had a lower endoscopy (such as a colonoscopy or flexible sigmoidoscopy) you may notice spotting of blood in your stool or on the toilet paper. Some abdominal soreness may be present for a day or two, also. ° °DIET: Your first meal following the procedure should be a light meal and then it is ok to progress to your normal diet. A half-sandwich or bowl of soup is an example of a good first meal. Heavy or fried foods are harder to digest and may make you feel nauseous or bloated. Drink plenty of fluids but you should avoid alcoholic beverages for 24 hours. If you had a esophageal dilation, please see attached instructions for diet.   ° °ACTIVITY: Your care partner should take you home directly after the procedure. You should plan to take it easy, moving slowly for the rest of the day. You can resume normal activity the day after the procedure however YOU SHOULD NOT DRIVE, use power tools, machinery or perform tasks that involve climbing or major physical exertion for 24 hours (because of the sedation medicines used during the test).  ° °SYMPTOMS TO REPORT IMMEDIATELY: °A gastroenterologist can be reached at any hour. Please call [phone]  for any of the following symptoms:  °Following lower endoscopy (colonoscopy, flexible sigmoidoscopy) °Excessive amounts of blood in the stool  °Significant tenderness, worsening of abdominal pains  °Swelling of the abdomen that is new, acute  °Fever of 100° or  higher  °Following upper endoscopy (EGD, EUS, ERCP, esophageal dilation) °Vomiting of blood or coffee ground material  °New, significant abdominal pain  °New, significant chest pain or pain under the shoulder blades  °Painful or persistently difficult swallowing  °New shortness of breath  °Black, tarry-looking or red, bloody stools ° °FOLLOW UP:  °If any biopsies were taken you will be contacted by phone or by letter within the next 1-3 weeks. Call [phone]  if you have not heard about the biopsies in 3 weeks.  °Please also call with any specific questions about appointments or follow up tests. ° °

## 2017-06-06 NOTE — Interval H&P Note (Signed)
History and Physical Interval Note:  06/06/2017 11:14 AM  Dylan [name]  has presented today for surgery, with the diagnosis of cirrhosis, screen for varcies  The various methods of treatment have been discussed with the patient and family. After consideration of risks, benefits and other options for treatment, the patient has consented to  Procedure(s): ESOPHAGOGASTRODUODENOSCOPY (EGD) (N/A) as a surgical intervention .  The patient's history has been reviewed, patient examined, no change in status, stable for surgery.  I have reviewed the patient's chart and labs.  Questions were answered to the patient's satisfaction.     [place]

## 2017-06-06 NOTE — Transfer of Care (Signed)
Immediate Anesthesia Transfer of Care Note  Patient: Dylan [name]  Procedure(s) Performed: Procedure(s): ESOPHAGOGASTRODUODENOSCOPY (EGD) (N/A)  Patient Location: PACU  Anesthesia Type:MAC  Level of Consciousness:  sedated, patient cooperative and responds to stimulation  Airway & Oxygen Therapy:Patient Spontanous Breathing and Patient connected to face mask oxgen  Post-op Assessment:  Report given to PACU RN and Post -op Vital signs reviewed and stable  Post vital signs:  Reviewed and stable  Last Vitals:  Vitals:   06/06/17 0953  BP: (!) 138/100  Pulse: 81  Resp: 20  Temp: 36.8 C  SpO2: 63%    Complications: No apparent anesthesia complications

## 2017-06-06 NOTE — Op Note (Signed)
[facility] Patient Name: Dylan [name] Procedure Date: 06/06/2017 MRN: [id number] Attending MD: Carlota Raspberry. Armbruster MD, MD Date of Birth: [birth year] CSN: [id number] Age: 56 Admit Type: Outpatient Procedure:                Upper GI endoscopy Indications:              Cirrhosis - screening for esophageal varices Providers:                Remo Lipps P. Armbruster MD, MD, Cleda Daub, RN,                            William Dalton, Technician Referring MD:              Medicines:                Monitored Anesthesia Care Complications:            No immediate complications. Estimated blood loss:                            Minimal. Estimated Blood Loss:     Estimated blood loss was minimal. Procedure:                Pre-Anesthesia Assessment:                           - Prior to the procedure, a History and Physical                            was performed, and patient medications and                            allergies were reviewed. The patient's tolerance of                            previous anesthesia was also reviewed. The risks                            and benefits of the procedure and the sedation                            options and risks were discussed with the patient.                            All questions were answered, and informed consent                            was obtained. Prior Anticoagulants: The patient has                            taken no previous anticoagulant or antiplatelet                            agents. ASA Grade Assessment: III - A patient with  severe systemic disease. After reviewing the risks                            and benefits, the patient was deemed in                            satisfactory condition to undergo the procedure.                           After obtaining informed consent, the endoscope was                            passed under direct vision. Throughout the     procedure, the patient's blood pressure, pulse, and                            oxygen saturations were monitored continuously. The                            EG-[id number] ([id number]) scope was introduced through the                            mouth, and advanced to the second part of duodenum.                            The upper GI endoscopy was accomplished without                            difficulty. The patient tolerated the procedure                            well. Scope In: Scope Out: Findings:      Esophagogastric landmarks were identified: the Z-line was found at 38       cm, the gastroesophageal junction was found at 38 cm and the upper       extent of the gastric folds was found at 40 cm from the incisors.      A 2 cm hiatal hernia was present.      There were possible trace columns of varices in the distal esophagus       versus normal variant - no significant varices noted. The exam of the       esophagus was otherwise normal.      The entire examined stomach was normal. No gastric varices      The duodenal bulb and second portion of the duodenum were normal. Impression:               - Esophagogastric landmarks identified.                           - 2 cm hiatal hernia.                           - No significant esophageal varices                           -  Normal stomach.                           - Normal duodenal bulb and second portion of the                            duodenum. Moderate Sedation:      No moderate sedation, case performed with MAC Recommendation:           - Patient has a contact number available for                            emergencies. The signs and symptoms of potential                            delayed complications were discussed with the                            patient. Return to normal activities tomorrow.                            Written discharge instructions were provided to the                            patient.                            - Resume previous diet.                           - Continue present medications.                           - Repeat upper endoscopy in 1-2 years for screening                            purposes.                           - Repeat [place] screening with [place] and labs in December                           - Continued alcohol abstinence Procedure Code(s):        --- Professional ---                           [phone], Esophagogastroduodenoscopy, flexible,                            transoral; diagnostic, including collection of                            specimen(s) by brushing or washing, when performed                            (separate procedure) Diagnosis Code(s):        --- Professional ---  K44.9, Diaphragmatic hernia without obstruction or                            gangrene                           K74.60, Unspecified cirrhosis of liver CPT copyright 2016 American Medical Association. All rights reserved. The codes documented in this report are preliminary and upon coder review may  be revised to meet current compliance requirements. Remo Lipps P. Armbruster MD, MD 06/06/2017 11:38:14 AM This report has been signed electronically. Number of Addenda: 0

## 2017-06-06 NOTE — H&P (Signed)
HPI:   Dylan [name] is a 56 y.o. male with a history of cirrhosis, in need for screening for varices. He has not had prior bleeding. He denies any complaints today.   Past Medical History:  Diagnosis Date  . Cholelithiasis   . Cirrhosis ([place])   . COPD (chronic obstructive pulmonary disease) ([place])    1 year  . Gallstones   . Hepatitis C    3 years  . PONV (postoperative nausea and vomiting)   . Smoker    1/2 ppd for 40 years    Past Surgical History:  Procedure Laterality Date  . COLONOSCOPY WITH PROPOFOL N/A 12/11/2014   Procedure: COLONOSCOPY WITH PROPOFOL;  Surgeon: Inda Castle, MD;  Location: [place];  Service: Endoscopy;  Laterality: N/A;  . ESOPHAGEAL BANDING N/A 12/11/2014   Procedure: ESOPHAGEAL BANDING;  Surgeon: Inda Castle, MD;  Location: [place];  Service: Endoscopy;  Laterality: N/A;  . ESOPHAGOGASTRODUODENOSCOPY (EGD) WITH PROPOFOL N/A 12/11/2014   Procedure: ESOPHAGOGASTRODUODENOSCOPY (EGD) WITH PROPOFOL;  Surgeon: Inda Castle, MD;  Location: [place];  Service: Endoscopy;  Laterality: N/A;  . KNEE ARTHROSCOPY Right     Family History  Problem Relation Age of Onset  . Diabetes Mother   . Alzheimer's disease Mother   . Diabetes Maternal Grandmother   . Mesothelioma Father   . Colon cancer Neg Hx   . Stomach cancer Neg Hx     Social History  Substance Use Topics  . Smoking status: Former Smoker    Packs/day: 0.50    Years: 40.00    Types: Cigarettes    Quit date: 04/11/2014  . Smokeless tobacco: Never Used  . Alcohol use 0.0 oz/week     Comment: pint per week     Prior to Admission medications   Medication Sig Start Date End Date Taking? Authorizing Provider  Ascorbic Acid (VITAMIN C PO) Take 1 tablet by mouth daily.   Yes [provider]  B Complex Vitamins (VITAMIN-B COMPLEX PO) Take 1 tablet by mouth daily.   Yes [provider]  VITAMIN E PO Take 1 tablet by mouth daily.   Yes [provider]    Current Facility-Administered Medications  Medication Dose Route Frequency Provider Last Rate Last Dose  . 0.9 %  sodium chloride infusion   Intravenous Continuous Armbruster, Carlota Raspberry, MD      . lactated ringers infusion   Intravenous Continuous Yetta Flock, MD 125 mL/hr at 06/06/17 0958 1,000 mL at 06/06/17 0958    Allergies as of 02/09/2017 - Review Complete 08/04/2016  Allergen Reaction Noted  . Other Nausea And Vomiting 04/11/2014     Review of Systems:    As per HPI, otherwise negative    Physical Exam:  Vital signs in last 24 hours: Temp:  [98.2 F (36.8 C)] 98.2 F (36.8 C) (10/09 0953) Pulse Rate:  [81] 81 (10/09 0953) Resp:  [20] 20 (10/09 0953) BP: (138)/(100) 138/100 (10/09 0953) SpO2:  [97 %] 97 % (10/09 0953) Weight:  [160 lb (72.6 kg)] 160 lb (72.6 kg) (10/09 0953)   General:   Pleasant male in NAD Lungs:  Respirations even and unlabored. Lungs clear to auscultation bilaterally.   No wheezes, crackles, or rhonchi.  Heart:  Regular rate and rhythm; no MRG Abdomen:  Soft, nondistended, nontender.  Extremities:  Without edema.  Lab Results  Component Value Date   WBC 4.0  02/08/2017   HGB 16.3 02/08/2017   HCT 48.8 02/08/2017   MCV 93.8 02/08/2017   PLT 152.0 02/08/2017    Lab Results  Component Value Date   CREATININE 1.09 02/08/2017   BUN 10 02/08/2017   NA 139 02/08/2017   K 4.3 02/08/2017   CL 103 02/08/2017   CO2 31 02/08/2017    Lab Results  Component Value Date   ALT 32 02/08/2017   AST 31 02/08/2017   ALKPHOS 65 02/08/2017   BILITOT 1.4 (H) 02/08/2017      Impression / Plan:   56 y/o male here for EGD to screen for varices, potentially band if any high risk lesions. I have discussed risks / benefits of EGD and anesthesia with him. He wishes to proceed. Further recommendations pending the results.   Flippin Cellar, MD [facility] Pager [phone]

## 2017-06-07 ENCOUNTER — Encounter (HOSPITAL_COMMUNITY): Payer: Self-pay | Admitting: Gastroenterology

## 2017-06-20 ENCOUNTER — Telehealth: Payer: Self-pay | Admitting: Gastroenterology

## 2017-06-21 NOTE — Telephone Encounter (Signed)
Left message for patient that his last EGD was 06/06/17. Mailed copy of report to patient.

## 2017-08-01 ENCOUNTER — Other Ambulatory Visit: Payer: Self-pay

## 2017-08-01 ENCOUNTER — Telehealth: Payer: Self-pay

## 2017-08-01 DIAGNOSIS — K7030 Alcoholic cirrhosis of liver without ascites: Secondary | ICD-10-CM

## 2017-08-01 NOTE — Telephone Encounter (Signed)
-----   Message from Doristine Counter, RN sent at 06/14/2017  3:48 PM EDT ----- Schedule repeat [place] screening US and labs in Dec.

## 2017-08-01 NOTE — Telephone Encounter (Signed)
Patient aware of scheduled [place] and needed lab work.

## 2017-08-07 ENCOUNTER — Ambulatory Visit (HOSPITAL_COMMUNITY): Payer: Self-pay

## 2017-08-09 ENCOUNTER — Telehealth: Payer: Self-pay | Admitting: Gastroenterology

## 2017-08-10 ENCOUNTER — Other Ambulatory Visit: Payer: Self-pay

## 2017-08-10 DIAGNOSIS — K7030 Alcoholic cirrhosis of liver without ascites: Secondary | ICD-10-CM

## 2017-08-10 NOTE — Telephone Encounter (Signed)
Spoke to patient and gave him the number to radiology scheduling to reschedule his [place], also reminded him to get the lab work done, orders in [place].

## 2017-08-23 ENCOUNTER — Ambulatory Visit (HOSPITAL_COMMUNITY)
Admission: RE | Admit: 2017-08-23 | Discharge: 2017-08-23 | Disposition: A | Discharge status: Home / Self Care | Payer: Self-pay | Source: Ambulatory Visit | Attending: Gastroenterology | Admitting: Gastroenterology

## 2017-08-23 ENCOUNTER — Other Ambulatory Visit (INDEPENDENT_AMBULATORY_CARE_PROVIDER_SITE_OTHER): Payer: Self-pay

## 2017-08-23 DIAGNOSIS — K7030 Alcoholic cirrhosis of liver without ascites: Secondary | ICD-10-CM

## 2017-08-23 DIAGNOSIS — K8020 Calculus of gallbladder without cholecystitis without obstruction: Secondary | ICD-10-CM | POA: Insufficient documentation

## 2017-08-23 LAB — COMPREHENSIVE METABOLIC PANEL
ALBUMIN: 4.5 g/dL (ref 3.5–5.2)
ALK PHOS: 54 U/L (ref 39–117)
ALT: 34 U/L (ref 0–53)
AST: 34 U/L (ref 0–37)
BILIRUBIN TOTAL: 1.6 mg/dL — AB (ref 0.2–1.2)
BUN: 13 mg/dL (ref 6–23)
CALCIUM: 9.3 mg/dL (ref 8.4–10.5)
CHLORIDE: 103 meq/L (ref 96–112)
CO2: 29 mEq/L (ref 19–32)
CREATININE: 1.18 mg/dL (ref 0.40–1.50)
GFR: 81.58 mL/min (ref >60.00)
Glucose, Bld: 106 mg/dL — ABNORMAL HIGH (ref 70–99)
Potassium: 4.5 mEq/L (ref 3.5–5.1)
Sodium: 140 mEq/L (ref 135–145)
TOTAL PROTEIN: 7.9 g/dL (ref 6.0–8.3)

## 2017-08-23 LAB — CBC WITH DIFFERENTIAL/PLATELET
BASOS PCT: 1.8 % (ref 0.0–3.0)
Basophils Absolute: 0.1 10*3/uL (ref 0.0–0.1)
EOS ABS: 0.1 10*3/uL (ref 0.0–0.7)
EOS PCT: 3.7 % (ref 0.0–5.0)
HEMATOCRIT: 49 % (ref 39.0–52.0)
HEMOGLOBIN: 16.3 g/dL (ref 13.0–17.0)
LYMPHS PCT: 26.9 % (ref 12.0–46.0)
Lymphs Abs: 0.9 10*3/uL (ref 0.7–4.0)
MCHC: 33.3 g/dL (ref 30.0–36.0)
MCV: 96.4 fl (ref 78.0–100.0)
Monocytes Absolute: 0.6 10*3/uL (ref 0.1–1.0)
Monocytes Relative: 16.5 % — ABNORMAL HIGH (ref 3.0–12.0)
Neutro Abs: 1.7 10*3/uL (ref 1.4–7.7)
Neutrophils Relative %: 51.1 % (ref 43.0–77.0)
Platelets: 163 10*3/uL (ref 150.0–400.0)
RBC: 5.08 Mil/uL (ref 4.22–5.81)
RDW: 14.3 % (ref 11.5–15.5)
WBC: 3.3 10*3/uL — AB (ref 4.0–10.5)

## 2017-08-23 LAB — PROTIME-INR
INR: 1.1 ratio — ABNORMAL HIGH (ref 0.8–1.0)
Prothrombin Time: 12 s (ref 9.6–13.1)

## 2017-08-24 LAB — AFP TUMOR MARKER: AFP TUMOR MARKER: 6.6 ng/mL — AB (ref <6.1)

## 2017-10-26 ENCOUNTER — Encounter: Payer: Self-pay | Admitting: Gastroenterology

## 2017-10-26 ENCOUNTER — Encounter (INDEPENDENT_AMBULATORY_CARE_PROVIDER_SITE_OTHER): Payer: Self-pay

## 2017-10-26 ENCOUNTER — Ambulatory Visit (INDEPENDENT_AMBULATORY_CARE_PROVIDER_SITE_OTHER): Payer: Self-pay | Admitting: Gastroenterology

## 2017-10-26 VITALS — BP 110/72 | HR 100 | Ht 67.0 in | Wt 169.0 lb

## 2017-10-26 DIAGNOSIS — K7460 Unspecified cirrhosis of liver: Secondary | ICD-10-CM

## 2017-10-26 DIAGNOSIS — G8929 Other chronic pain: Secondary | ICD-10-CM

## 2017-10-26 DIAGNOSIS — M545 Low back pain, unspecified: Secondary | ICD-10-CM

## 2017-10-26 NOTE — Progress Notes (Signed)
HPI :  57 year old male here for follow-up for cirrhosis. History of hepatitis C, treated in the past with resolution. Also with history of heavy alcohol use. History of grade 1 varices in 2011 with repeat EGD on April 2016, and then 05/2017 which showed no / trace varices.  He generally states he is feeling pretty well.  He has not had any compensations of his cirrhosis since of last seen him.  He had an ultrasound of his liver in December for [facility] screening which showed stable changes of gallstones and cirrhosis, no evidence of HCC.  His alpha-fetoprotein level 6.6.  He had an endoscopy of October 2018 which showed trace varices versus normal variant.  He is continuing to drink alcohol, he states roughly a few drinks per 2 weeks or so.  He otherwise continues to complain of left lower back/flank pain.  He states he is concerned about his kidneys causing this.  He states the discomfort is there most of the time, it is worse when he twists or with positional changes.  He denies any radiation down the leg or numbness.  His bowels are normal, he denies any blood in stools.  Having a bowel movement does not influence his pain. Prior MRI abdomen did not show any concerning pathology.  His pain is been going on for a few years  Last colonoscopy 11/2014 was normal without colon polyps.   EGD 06/06/2017 - 2cm HH, possible trace varices versus normal variant - repeat EGD in 1-2 years  US abdomen 08/23/2017 - gallstones, stable cirrhosis, no HCC AFP on 08/23/17 - 6.6 INR 1.1    Past Medical History:  Diagnosis Date  . Cholelithiasis   . Cirrhosis ([place])   . COPD (chronic obstructive pulmonary disease) ([place])    1 year  . Gallstones   . Hepatitis C    3 years  . PONV (postoperative nausea and vomiting)   . Smoker    1/2 ppd for 40 years     Past Surgical History:  Procedure Laterality Date  . COLONOSCOPY WITH PROPOFOL N/A 12/11/2014   Procedure: COLONOSCOPY WITH PROPOFOL;  Surgeon: Inda Castle, MD;  Location: [place];  Service: Endoscopy;  Laterality: N/A;  . ESOPHAGEAL BANDING N/A 12/11/2014   Procedure: ESOPHAGEAL BANDING;  Surgeon: Inda Castle, MD;  Location: [place];  Service: Endoscopy;  Laterality: N/A;  . ESOPHAGOGASTRODUODENOSCOPY N/A 06/06/2017   Procedure: ESOPHAGOGASTRODUODENOSCOPY (EGD);  Surgeon: Yetta Flock, MD;  Location: [facility];  Service: Gastroenterology;  Laterality: N/A;  . ESOPHAGOGASTRODUODENOSCOPY (EGD) WITH PROPOFOL N/A 12/11/2014   Procedure: ESOPHAGOGASTRODUODENOSCOPY (EGD) WITH PROPOFOL;  Surgeon: Inda Castle, MD;  Location: [place];  Service: Endoscopy;  Laterality: N/A;  . KNEE ARTHROSCOPY Right    Family History  Problem Relation Age of Onset  . Diabetes Mother   . Alzheimer's disease Mother   . Diabetes Maternal Grandmother   . Mesothelioma Father   . Colon cancer Neg Hx   . Stomach cancer Neg Hx    Social History   Tobacco Use  . Smoking status: Former Smoker    Packs/day: 0.50    Years: 40.00    Pack years: 20.00    Types: Cigarettes    Last attempt to quit: 04/11/2014    Years since quitting: 3.5  . Smokeless tobacco: Never Used  Substance Use Topics  . Alcohol use: Yes    Alcohol/week: 0.0 oz    Comment: pint per week   . Drug use: Yes  Comment: hx of 28 years ago - marijuana, acid, cocaine    Current Outpatient Medications  Medication Sig Dispense Refill  . Ascorbic Acid (VITAMIN C PO) Take 1 tablet by mouth daily.    . B Complex Vitamins (VITAMIN-B COMPLEX PO) Take 1 tablet by mouth daily.    Marland Kitchen VITAMIN E PO Take 1 tablet by mouth daily.     No current facility-administered medications for this visit.    Allergies  Allergen Reactions  . Other Nausea And Vomiting    Narcotics- pt states he does not like to take narcotics d/t makes him sick     Review of Systems: All systems reviewed and negative except where noted in HPI.   Lab Results  Component Value Date   WBC 3.3 (L)  08/23/2017   HGB 16.3 08/23/2017   HCT 49.0 08/23/2017   MCV 96.4 08/23/2017   PLT 163.0 08/23/2017    Lab Results  Component Value Date   CREATININE 1.18 08/23/2017   BUN 13 08/23/2017   NA 140 08/23/2017   K 4.5 08/23/2017   CL 103 08/23/2017   CO2 29 08/23/2017    Lab Results  Component Value Date   ALT 34 08/23/2017   AST 34 08/23/2017   ALKPHOS 54 08/23/2017   BILITOT 1.6 (H) 08/23/2017     Physical Exam: BP 110/72   Pulse 100   Ht 5\' 7"  (1.702 m)   Wt 169 lb (76.7 kg)   BMI 26.47 kg/m  Constitutional: Pleasant,well-developed, male in no acute distress. HEENT: Normocephalic and atraumatic. Conjunctivae are normal. No scleral icterus. Neck supple.  Cardiovascular: Normal rate, regular rhythm.  Pulmonary/chest: Effort normal and breath sounds normal. No wheezing, rales or rhonchi. Abdominal: Soft, nondistended, nontender. There are no masses palpable. No hepatomegaly. Back - reproducible tenderness to left lower back with palpatio Extremities: no edema Lymphadenopathy: No cervical adenopathy noted. Neurological: Alert and oriented to person place and time. Skin: Skin is warm and dry. No rashes noted. Psychiatric: Normal mood and affect. Behavior is normal.   ASSESSMENT AND PLAN: 57 year old male here for reassessment following issues:  Cirrhosis - due to hepatitis C which has been eradicated +/-alcohol -he is well compensated at this time.  He continues to drink alcohol periodically, and I have counseled him on complete abstinence of alcohol, with potential risks of decompensation and HCC.  He is due for repeat ultrasound, and labs in June 2019.  He is due for repeat upper endoscopy for varices screening in 2020.  Recommended flu shot today which he declined.  He can follow-up with me in 6 months or as needed moving forward in the interim  Low back pain - his recent ultrasound shows no evidence of renal stones.  I think he has musculoskeletal back pain and  recommend he follow-up with his primary care for further recommendations on management.  [place] Cellar, MD [facility] [place] [facility] Pager [phone]

## 2018-01-26 ENCOUNTER — Emergency Department (HOSPITAL_COMMUNITY)
Admission: EM | Admit: 2018-01-26 | Discharge: 2018-01-26 | Disposition: A | Discharge status: Home / Self Care | Payer: Self-pay | Attending: Emergency Medicine | Admitting: Emergency Medicine

## 2018-01-26 ENCOUNTER — Other Ambulatory Visit: Payer: Self-pay

## 2018-01-26 ENCOUNTER — Emergency Department (HOSPITAL_COMMUNITY): Payer: Self-pay

## 2018-01-26 ENCOUNTER — Encounter (HOSPITAL_COMMUNITY): Payer: Self-pay | Admitting: Emergency Medicine

## 2018-01-26 DIAGNOSIS — S20211A Contusion of right front wall of thorax, initial encounter: Secondary | ICD-10-CM | POA: Insufficient documentation

## 2018-01-26 DIAGNOSIS — Y999 Unspecified external cause status: Secondary | ICD-10-CM | POA: Insufficient documentation

## 2018-01-26 DIAGNOSIS — Y929 Unspecified place or not applicable: Secondary | ICD-10-CM | POA: Insufficient documentation

## 2018-01-26 DIAGNOSIS — S52124A Nondisplaced fracture of head of right radius, initial encounter for closed fracture: Secondary | ICD-10-CM | POA: Insufficient documentation

## 2018-01-26 DIAGNOSIS — Y939 Activity, unspecified: Secondary | ICD-10-CM | POA: Insufficient documentation

## 2018-01-26 DIAGNOSIS — S0990XA Unspecified injury of head, initial encounter: Secondary | ICD-10-CM | POA: Insufficient documentation

## 2018-01-26 DIAGNOSIS — S7001XA Contusion of right hip, initial encounter: Secondary | ICD-10-CM | POA: Insufficient documentation

## 2018-01-26 MED ORDER — MORPHINE SULFATE (PF) 4 MG/ML IV SOLN
4.0000 mg | Freq: Once | INTRAVENOUS | Status: AC
  Administered 2018-01-26: 4 mg via INTRAMUSCULAR
  Filled 2018-01-26: qty 1

## 2018-01-26 MED ORDER — OXYCODONE-ACETAMINOPHEN 5-325 MG PO TABS
1.0000 | ORAL_TABLET | Freq: Once | ORAL | Status: AC
  Administered 2018-01-26: 1 via ORAL
  Filled 2018-01-26: qty 1

## 2018-01-26 MED ORDER — HYDROCODONE-ACETAMINOPHEN 5-325 MG PO TABS: 1.0000 | ORAL_TABLET | Freq: Four times a day (QID) | ORAL | 0 refills | Status: DC | PRN

## 2018-01-26 MED ORDER — IBUPROFEN 800 MG PO TABS: 800.0000 mg | ORAL_TABLET | Freq: Three times a day (TID) | ORAL | 0 refills | Status: DC | PRN

## 2018-01-26 MED ORDER — KETOROLAC TROMETHAMINE 30 MG/ML IJ SOLN
60.0000 mg | Freq: Once | INTRAMUSCULAR | Status: AC
  Administered 2018-01-26: 60 mg via INTRAMUSCULAR
  Filled 2018-01-26: qty 2

## 2018-01-26 NOTE — ED Notes (Signed)
ED Provider at bedside. 

## 2018-01-26 NOTE — ED Notes (Signed)
Pt ambulatory in hallway with limp, states right hip has twinge of intense pain with some steps but not all.
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
## 2018-01-26 NOTE — ED Notes (Signed)
Ortho bedside

## 2018-01-26 NOTE — Discharge Instructions (Signed)
Return here as needed.  Follow-up with the orthopedist provided.  Ice and elevate the area.

## 2018-01-26 NOTE — ED Notes (Signed)
Patient verbalizes understanding of discharge instructions. Opportunity for questioning and answers were provided. Armband removed by staff, pt discharged from ED ambulatory with wife.  

## 2018-01-26 NOTE — ED Notes (Signed)
Ortho paged for splint and sling
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
## 2018-02-20 ENCOUNTER — Telehealth: Payer: Self-pay

## 2018-02-20 DIAGNOSIS — K7460 Unspecified cirrhosis of liver: Secondary | ICD-10-CM

## 2018-02-20 NOTE — Telephone Encounter (Signed)
-----   Message from Roetta Sessions, [place] sent at 10/26/2017  3:01 PM EST ----- Regarding: recall U/S and labs due Cirrhosis:  Recall [place] and labs due in June  cmet inr AFP

## 2018-02-20 NOTE — Telephone Encounter (Signed)
Called and LM for pt to call back to schedule U/S at [facility]. Labs are due and orders entered.

## 2018-02-20 NOTE — Telephone Encounter (Signed)
Pt called back.  Ok to schedule.   scheduled for an abdominal ultrasound at [facility] (1st floor of hospital) on Friday at 02-23-18 at 9:00am. To arrive at 8:45am . NPO 6 hours. Pt to call   [phone] if need to reschedule.  Pt informed. Will go to lab before or after on the 28th.

## 2018-02-21 ENCOUNTER — Other Ambulatory Visit (INDEPENDENT_AMBULATORY_CARE_PROVIDER_SITE_OTHER): Payer: Self-pay

## 2018-02-21 DIAGNOSIS — K7460 Unspecified cirrhosis of liver: Secondary | ICD-10-CM

## 2018-02-21 LAB — COMPREHENSIVE METABOLIC PANEL
ALBUMIN: 4.7 g/dL (ref 3.5–5.2)
ALT: 33 U/L (ref 0–53)
AST: 28 U/L (ref 0–37)
Alkaline Phosphatase: 70 U/L (ref 39–117)
BUN: 13 mg/dL (ref 6–23)
CALCIUM: 9.9 mg/dL (ref 8.4–10.5)
CHLORIDE: 106 meq/L (ref 96–112)
CO2: 28 mEq/L (ref 19–32)
Creatinine, Ser: 1.14 mg/dL (ref 0.40–1.50)
GFR: 84.74 mL/min (ref >60.00)
Glucose, Bld: 102 mg/dL — ABNORMAL HIGH (ref 70–99)
POTASSIUM: 4 meq/L (ref 3.5–5.1)
Sodium: 141 mEq/L (ref 135–145)
Total Bilirubin: 1.1 mg/dL (ref 0.2–1.2)
Total Protein: 8.2 g/dL (ref 6.0–8.3)

## 2018-02-21 LAB — PROTIME-INR
INR: 1.1 ratio — AB (ref 0.8–1.0)
Prothrombin Time: 13.2 s — ABNORMAL HIGH (ref 9.6–13.1)

## 2018-02-22 LAB — AFP TUMOR MARKER: AFP-Tumor Marker: 6.6 ng/mL — ABNORMAL HIGH (ref <6.1)

## 2018-02-23 ENCOUNTER — Ambulatory Visit (HOSPITAL_COMMUNITY)
Admission: RE | Admit: 2018-02-23 | Discharge: 2018-02-23 | Disposition: A | Discharge status: Home / Self Care | Payer: Self-pay | Source: Ambulatory Visit | Attending: Gastroenterology | Admitting: Gastroenterology

## 2018-02-23 DIAGNOSIS — K8020 Calculus of gallbladder without cholecystitis without obstruction: Secondary | ICD-10-CM | POA: Insufficient documentation

## 2018-02-23 DIAGNOSIS — K7030 Alcoholic cirrhosis of liver without ascites: Secondary | ICD-10-CM | POA: Insufficient documentation

## 2018-02-28 NOTE — Telephone Encounter (Signed)
Pt need to reschd this appt.

## 2018-02-28 NOTE — Telephone Encounter (Signed)
Pt wanted to schedule an office visit appt with Dr. Havery Moros.  See results note from U/S 02-23-18.  Scheduled pt for 05-04-18.

## 2018-05-04 ENCOUNTER — Ambulatory Visit: Payer: Self-pay | Admitting: Gastroenterology

## 2018-06-20 ENCOUNTER — Other Ambulatory Visit (INDEPENDENT_AMBULATORY_CARE_PROVIDER_SITE_OTHER): Payer: Self-pay

## 2018-06-20 ENCOUNTER — Ambulatory Visit (INDEPENDENT_AMBULATORY_CARE_PROVIDER_SITE_OTHER): Payer: Self-pay | Admitting: Gastroenterology

## 2018-06-20 ENCOUNTER — Encounter: Payer: Self-pay | Admitting: Gastroenterology

## 2018-06-20 VITALS — BP 144/88 | HR 76 | Ht 67.0 in | Wt 168.1 lb

## 2018-06-20 DIAGNOSIS — R140 Abdominal distension (gaseous): Secondary | ICD-10-CM

## 2018-06-20 DIAGNOSIS — K7460 Unspecified cirrhosis of liver: Secondary | ICD-10-CM

## 2018-06-20 LAB — COMPREHENSIVE METABOLIC PANEL
ALT: 34 U/L (ref 0–53)
AST: 29 U/L (ref 0–37)
Albumin: 4.6 g/dL (ref 3.5–5.2)
Alkaline Phosphatase: 56 U/L (ref 39–117)
BUN: 11 mg/dL (ref 6–23)
CO2: 26 mEq/L (ref 19–32)
Calcium: 9.5 mg/dL (ref 8.4–10.5)
Chloride: 105 mEq/L (ref 96–112)
Creatinine, Ser: 1.03 mg/dL (ref 0.40–1.50)
GFR: 95.16 mL/min (ref >60.00)
GLUCOSE: 96 mg/dL (ref 70–99)
POTASSIUM: 4 meq/L (ref 3.5–5.1)
Sodium: 140 mEq/L (ref 135–145)
Total Bilirubin: 1 mg/dL (ref 0.2–1.2)
Total Protein: 8.1 g/dL (ref 6.0–8.3)

## 2018-06-20 LAB — PROTIME-INR
INR: 1 ratio (ref 0.8–1.0)
PROTHROMBIN TIME: 12.2 s (ref 9.6–13.1)

## 2018-06-20 NOTE — Progress Notes (Signed)
HPI :  57 year old male here for follow-up for cirrhosis.History of hepatitis C, treated in the past with resolution. Also with history of heavy alcohol use. History of grade 1 varices in 2011 with repeat EGD on April 2016, and then 05/2017 which showed no / trace varices.  The patient reports she is generally been doing okay. Main concern is that he is having intermittent abdominal distention and swelling at times. He is also had some associated mild discomfort with this. He reports this is usually pain that starts with the onset of bowel movement and is usually relieved with a bowel movement. He denies any blood in his stools are altered bowels otherwise. Generally has some mild loose stools. He denies any lower extremity edema. No jaundice. He reports he "feels pregnant" at times due to abdominal distention. He does admit to drinking alcohol periodically. He will drink a few alcoholic beverages once every 2-3 weeks, last time drinking was yesterday.   [place] 02/23/18 - cirrhosis, no mass lesions, gallstones, no ascites  Last colonoscopy 11/2014 was normal without colon polyps.   Past Medical History:  Diagnosis Date  . Cholelithiasis   . Cirrhosis ([place])   . COPD (chronic obstructive pulmonary disease) ([place])    1 year  . Gallstones   . Hepatitis C    3 years  . PONV (postoperative nausea and vomiting)   . Smoker    1/2 ppd for 40 years     Past Surgical History:  Procedure Laterality Date  . COLONOSCOPY WITH PROPOFOL N/A 12/11/2014   Procedure: COLONOSCOPY WITH PROPOFOL;  Surgeon: Inda Castle, MD;  Location: [facility];  Service: Endoscopy;  Laterality: N/A;  . ESOPHAGEAL BANDING N/A 12/11/2014   Procedure: ESOPHAGEAL BANDING;  Surgeon: Inda Castle, MD;  Location: [facility];  Service: Endoscopy;  Laterality: N/A;  . ESOPHAGOGASTRODUODENOSCOPY N/A 06/06/2017   Procedure: ESOPHAGOGASTRODUODENOSCOPY (EGD);  Surgeon: Yetta Flock, MD;  Location: [facility];  Service:  Gastroenterology;  Laterality: N/A;  . ESOPHAGOGASTRODUODENOSCOPY (EGD) WITH PROPOFOL N/A 12/11/2014   Procedure: ESOPHAGOGASTRODUODENOSCOPY (EGD) WITH PROPOFOL;  Surgeon: Inda Castle, MD;  Location: [facility];  Service: Endoscopy;  Laterality: N/A;  . KNEE ARTHROSCOPY Right    Family History  Problem Relation Age of Onset  . Diabetes Mother   . Alzheimer's disease Mother   . Diabetes Maternal Grandmother   . Mesothelioma Father   . Colon cancer Neg Hx   . Stomach cancer Neg Hx    Social History   Tobacco Use  . Smoking status: Former Smoker    Packs/day: 0.50    Years: 40.00    Pack years: 20.00    Types: Cigarettes    Last attempt to quit: 04/11/2014    Years since quitting: 4.1  . Smokeless tobacco: Never Used  Substance Use Topics  . Alcohol use: Yes    Alcohol/week: 0.0 standard drinks    Comment: pint per week   . Drug use: Yes    Comment: hx of 28 years ago - marijuana, acid, cocaine    Current Outpatient Medications  Medication Sig Dispense Refill  . Ascorbic Acid (VITAMIN C PO) Take 1 tablet by mouth daily.    . B Complex Vitamins (VITAMIN-B COMPLEX PO) Take 1 tablet by mouth daily.    Marland Kitchen HYDROcodone-acetaminophen (NORCO/VICODIN) 5-325 MG tablet Take 1 tablet by mouth every 6 (six) hours as needed for moderate pain. 15 tablet 0  . ibuprofen (ADVIL,MOTRIN) 800 MG tablet Take 1 tablet (800 mg  total) by mouth every 8 (eight) hours as needed. 21 tablet 0  . VITAMIN E PO Take 1 tablet by mouth daily.     No current facility-administered medications for this visit.    Allergies  Allergen Reactions  . Other Nausea And Vomiting    Narcotics- pt states he does not like to take narcotics d/t makes him sick     Review of Systems: All systems reviewed and negative except where noted in HPI.   Lab Results  Component Value Date   WBC 3.3 (L) 08/23/2017   HGB 16.3 08/23/2017   HCT 49.0 08/23/2017   MCV 96.4 08/23/2017   PLT 163.0 08/23/2017    Lab Results    Component Value Date   INR 1.1 (H) 02/21/2018   INR 1.1 (H) 08/23/2017   INR 1.1 (H) 02/08/2017    Lab Results  Component Value Date   ALT 33 02/21/2018   AST 28 02/21/2018   ALKPHOS 70 02/21/2018   BILITOT 1.1 02/21/2018     Physical Exam: BP (!) 144/88   Pulse 76   Ht 5\' 7"  (1.702 m)   Wt 168 lb 2 oz (76.3 kg)   BMI 26.33 kg/m  Constitutional: Pleasant,well-developed, male in no acute distress. HEENT: Normocephalic and atraumatic. Conjunctivae are normal. No scleral icterus. Neck supple.  Cardiovascular: Normal rate, regular rhythm.  Pulmonary/chest: Effort normal and breath sounds normal. No wheezing, rales or rhonchi. Abdominal: Soft,  distended, nontender.  There are no masses palpable. No hepatomegaly. Extremities: no edema Lymphadenopathy: No cervical adenopathy noted. Neurological: Alert and oriented to person place and time. Skin: Skin is warm and dry. No rashes noted. Psychiatric: Normal mood and affect. Behavior is normal.   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 He has some distention on exam today, although possible this could be due to intestinal gas and bloating as well. He is due for [facility] screening in December, but in light of symptoms will order complete ultrasound now to assess for ascites. If this is present we'll start him on some diuretics. I otherwise counseled him on the importance of a low sodium diet. We'll check his baseline labs today to ensure stable. I counseled him extensively on the importance of complete alcohol cessation light of his cirrhosis, with risks for decompensation and HCC. He will try to completely avoid alcohol at this time. I will continue see him every 6 weeks. If his ultrasound shows no evidence of ascites, we'll try a course of Bentyl for his abdominal  cramps and see if that helps. Due for EGD for varices screening next year. He agreed.  Irion Cellar, MD [facility]astro[facility]

## 2018-06-20 NOTE — Patient Instructions (Addendum)
If you are age 58 or older, your body mass index should be between 23-30. Your Body mass index is 26.33 kg/m. If this is out of the aforementioned range listed, please consider follow up with your Primary Care Provider.  If you are age 91 or younger, your body mass index should be between 19-25. Your Body mass index is 26.33 kg/m. If this is out of the aformentioned range listed, please consider follow up with your Primary Care Provider.   You have been scheduled for an abdominal ultrasound at [facility] (1st floor of hospital) on Wednesday, 06-27-18 at 9:00am. Please arrive 15 minutes prior to your appointment for registration. Make certain not to have anything to eat or drink 6 hours prior to your appointment. Should you need to reschedule your appointment, please contact radiology at [phone]. This test typically takes about 30 minutes to perform.  Please go to the lab in the basement of our building to have lab work done as you leave today. Hit "B" for basement when you get on the elevator.  When the doors open the lab is on your left.  We will call you with the results. Thank you.  Discontinue drinking alcohol.  We are giving you a hand out about a Low sodium diet today.   Thank you for entrusting me with your care and for choosing [facility], Dr. [place] Cellar

## 2018-06-27 ENCOUNTER — Ambulatory Visit (HOSPITAL_COMMUNITY): Admission: RE | Admit: 2018-06-27 | Payer: Self-pay | Source: Ambulatory Visit

## 2018-08-27 ENCOUNTER — Telehealth: Payer: Self-pay

## 2018-08-27 DIAGNOSIS — K7460 Unspecified cirrhosis of liver: Secondary | ICD-10-CM

## 2018-08-27 DIAGNOSIS — Z8719 Personal history of other diseases of the digestive system: Secondary | ICD-10-CM

## 2018-08-27 NOTE — Telephone Encounter (Signed)
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   Labs due: AFP, CBC, CMET, INR

## 2018-08-27 NOTE — Telephone Encounter (Signed)
Called and spoke to pt.  He was supposed to have U/S in October but he had to cancel it because of work. He agreed to have it rescheduled. I called and scheduled U/S at [facility] for next week on Thursday, 09-06-2018 at 9:30am. NPO 6 hours.  Called and spoke to pt and relayed appt. He expressed understanding and will go to lab before or after Ultrasound on the 7th.

## 2018-09-06 ENCOUNTER — Ambulatory Visit (HOSPITAL_COMMUNITY): Payer: Self-pay

## 2018-09-10 ENCOUNTER — Ambulatory Visit (HOSPITAL_COMMUNITY): Admission: RE | Admit: 2018-09-10 | Payer: Self-pay | Source: Ambulatory Visit

## 2018-12-16 ENCOUNTER — Emergency Department (HOSPITAL_COMMUNITY)
Admission: EM | Admit: 2018-12-16 | Discharge: 2018-12-16 | Disposition: A | Discharge status: Home / Self Care | Payer: Self-pay | Attending: Emergency Medicine | Admitting: Emergency Medicine

## 2018-12-16 ENCOUNTER — Emergency Department (HOSPITAL_COMMUNITY): Payer: Self-pay

## 2018-12-16 ENCOUNTER — Ambulatory Visit (HOSPITAL_COMMUNITY)
Admission: EM | Admit: 2018-12-16 | Discharge: 2018-12-16 | Disposition: A | Discharge status: Other Acute Inpatient Hospital | Payer: Self-pay

## 2018-12-16 ENCOUNTER — Encounter (HOSPITAL_COMMUNITY): Payer: Self-pay

## 2018-12-16 ENCOUNTER — Other Ambulatory Visit: Payer: Self-pay

## 2018-12-16 DIAGNOSIS — S060X1A Concussion with loss of consciousness of 30 minutes or less, initial encounter: Secondary | ICD-10-CM

## 2018-12-16 DIAGNOSIS — Y929 Unspecified place or not applicable: Secondary | ICD-10-CM | POA: Insufficient documentation

## 2018-12-16 DIAGNOSIS — S298XXA Other specified injuries of thorax, initial encounter: Secondary | ICD-10-CM

## 2018-12-16 DIAGNOSIS — S3011XA Contusion of abdominal wall, initial encounter: Secondary | ICD-10-CM

## 2018-12-16 DIAGNOSIS — Y9389 Activity, other specified: Secondary | ICD-10-CM | POA: Insufficient documentation

## 2018-12-16 DIAGNOSIS — W108XXA Fall (on) (from) other stairs and steps, initial encounter: Secondary | ICD-10-CM | POA: Insufficient documentation

## 2018-12-16 DIAGNOSIS — S20211A Contusion of right front wall of thorax, initial encounter: Secondary | ICD-10-CM | POA: Insufficient documentation

## 2018-12-16 DIAGNOSIS — W19XXXA Unspecified fall, initial encounter: Secondary | ICD-10-CM

## 2018-12-16 DIAGNOSIS — J449 Chronic obstructive pulmonary disease, unspecified: Secondary | ICD-10-CM | POA: Insufficient documentation

## 2018-12-16 DIAGNOSIS — S301XXA Contusion of abdominal wall, initial encounter: Secondary | ICD-10-CM | POA: Insufficient documentation

## 2018-12-16 DIAGNOSIS — Z87891 Personal history of nicotine dependence: Secondary | ICD-10-CM | POA: Insufficient documentation

## 2018-12-16 DIAGNOSIS — Y998 Other external cause status: Secondary | ICD-10-CM | POA: Insufficient documentation

## 2018-12-16 LAB — COMPREHENSIVE METABOLIC PANEL
ALT: 32 U/L (ref 0–44)
AST: 42 U/L — ABNORMAL HIGH (ref 15–41)
Albumin: 4.4 g/dL (ref 3.5–5.0)
Alkaline Phosphatase: 56 U/L (ref 38–126)
Anion gap: 19 — ABNORMAL HIGH (ref 5–15)
BUN: 12 mg/dL (ref 6–20)
CO2: 18 mmol/L — ABNORMAL LOW (ref 22–32)
Calcium: 9.6 mg/dL (ref 8.9–10.3)
Chloride: 102 mmol/L (ref 98–111)
Creatinine, Ser: 1.27 mg/dL — ABNORMAL HIGH (ref 0.61–1.24)
GFR calc Af Amer: >60 mL/min (ref >60)
GFR calc non Af Amer: >60 mL/min (ref >60)
Glucose, Bld: 197 mg/dL — ABNORMAL HIGH (ref 70–99)
Potassium: 4 mmol/L (ref 3.5–5.1)
Sodium: 139 mmol/L (ref 135–145)
Total Bilirubin: 1.4 mg/dL — ABNORMAL HIGH (ref 0.3–1.2)
Total Protein: 7.9 g/dL (ref 6.5–8.1)

## 2018-12-16 LAB — CBC WITH DIFFERENTIAL/PLATELET
Abs Immature Granulocytes: 0.02 10*3/uL (ref 0.00–0.07)
Basophils Absolute: 0 10*3/uL (ref 0.0–0.1)
Basophils Relative: 0 %
Eosinophils Absolute: 0 10*3/uL (ref 0.0–0.5)
Eosinophils Relative: 0 %
HCT: 51 % (ref 39.0–52.0)
Hemoglobin: 16.3 g/dL (ref 13.0–17.0)
Immature Granulocytes: 0 %
Lymphocytes Relative: 15 %
Lymphs Abs: 1.2 10*3/uL (ref 0.7–4.0)
MCH: 30.1 pg (ref 26.0–34.0)
MCHC: 32 g/dL (ref 30.0–36.0)
MCV: 94.1 fL (ref 80.0–100.0)
Monocytes Absolute: 0.4 10*3/uL (ref 0.1–1.0)
Monocytes Relative: 5 %
Neutro Abs: 5.9 10*3/uL (ref 1.7–7.7)
Neutrophils Relative %: 80 %
Platelets: 186 10*3/uL (ref 150–400)
RBC: 5.42 MIL/uL (ref 4.22–5.81)
RDW: 12.9 % (ref 11.5–15.5)
WBC: 7.5 10*3/uL (ref 4.0–10.5)
nRBC: 0 % (ref 0.0–0.2)

## 2018-12-16 LAB — URINALYSIS, ROUTINE W REFLEX MICROSCOPIC
Bacteria, UA: NONE SEEN
Bilirubin Urine: NEGATIVE
Glucose, UA: NEGATIVE mg/dL
Hgb urine dipstick: NEGATIVE
Ketones, ur: 20 mg/dL — AB
Leukocytes,Ua: NEGATIVE
Nitrite: NEGATIVE
Protein, ur: 100 mg/dL — AB
Specific Gravity, Urine: 1.024 (ref 1.005–1.030)
pH: 5 (ref 5.0–8.0)

## 2018-12-16 MED ORDER — HYDROCODONE-ACETAMINOPHEN 5-325 MG PO TABS: 1.0000 | ORAL_TABLET | ORAL | 0 refills | Status: DC | PRN

## 2018-12-16 MED ORDER — ONDANSETRON 4 MG PO TBDP: 4.0000 mg | ORAL_TABLET | Freq: Three times a day (TID) | ORAL | 0 refills | Status: DC | PRN

## 2018-12-16 MED ORDER — SODIUM CHLORIDE 0.9 % IV BOLUS
1000.0000 mL | Freq: Once | INTRAVENOUS | Status: AC
  Administered 2018-12-16: 1000 mL via INTRAVENOUS

## 2018-12-16 MED ORDER — IOHEXOL 300 MG/ML  SOLN
100.0000 mL | Freq: Once | INTRAMUSCULAR | Status: AC | PRN
  Administered 2018-12-16: 100 mL via INTRAVENOUS

## 2018-12-16 MED ORDER — MORPHINE SULFATE (PF) 4 MG/ML IV SOLN
4.0000 mg | Freq: Once | INTRAVENOUS | Status: AC
  Administered 2018-12-16: 4 mg via INTRAVENOUS
  Filled 2018-12-16: qty 1

## 2018-12-16 MED ORDER — ONDANSETRON HCL 4 MG/2ML IJ SOLN
4.0000 mg | Freq: Once | INTRAMUSCULAR | Status: AC
  Administered 2018-12-16: 13:00:00 4 mg via INTRAVENOUS
  Filled 2018-12-16: qty 2
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
## 2018-12-16 NOTE — ED Triage Notes (Signed)
Pt to go to ED for further eval with fall and LOC

## 2018-12-16 NOTE — ED Notes (Signed)
Patient verbalizes understanding of discharge instructions. Opportunity for questioning and answers were provided. Armband removed by staff, pt discharged from ED ambulatory (by choice)

## 2018-12-16 NOTE — ED Notes (Signed)
Patient transported to CT 

## 2018-12-16 NOTE — ED Triage Notes (Signed)
Pt reveals fall x 2 last night at approx 7 PM in which he missed a stair, fell, and hit his head on a ledge. Pt reports LOC. When he regained consciousness, he had one episode of vomiting and when he attempted to climb the rest of the stairs, felt lightheaded, and then fell down "all 14 steps and passed out again." Pt reports severe R sided chest wall and R sided abd pain. Denies HA, vision changes, neck/back pain. Laceration noted to patient's lip. Teeth intact. Patient A&Ox4, speaking in complete sentences. EDP at bedside.

## 2018-12-16 NOTE — ED Notes (Signed)
ED Provider at bedside. 

## 2019-06-30 DIAGNOSIS — Z8616 Personal history of COVID-19: Secondary | ICD-10-CM

## 2019-06-30 HISTORY — DX: Personal history of COVID-19: Z86.16

## 2019-07-16 ENCOUNTER — Emergency Department (HOSPITAL_COMMUNITY): Payer: HRSA Program

## 2019-07-16 ENCOUNTER — Inpatient Hospital Stay (HOSPITAL_COMMUNITY)
Admission: EM | Admit: 2019-07-16 | Discharge: 2019-07-21 | DRG: 177 | Disposition: A | Discharge status: Home / Self Care | Payer: HRSA Program | Attending: Internal Medicine | Admitting: Internal Medicine

## 2019-07-16 DIAGNOSIS — Z885 Allergy status to narcotic agent status: Secondary | ICD-10-CM

## 2019-07-16 DIAGNOSIS — J9601 Acute respiratory failure with hypoxia: Secondary | ICD-10-CM | POA: Diagnosis present

## 2019-07-16 DIAGNOSIS — J449 Chronic obstructive pulmonary disease, unspecified: Secondary | ICD-10-CM | POA: Diagnosis present

## 2019-07-16 DIAGNOSIS — K7460 Unspecified cirrhosis of liver: Secondary | ICD-10-CM | POA: Diagnosis present

## 2019-07-16 DIAGNOSIS — K219 Gastro-esophageal reflux disease without esophagitis: Secondary | ICD-10-CM | POA: Diagnosis present

## 2019-07-16 DIAGNOSIS — Z87891 Personal history of nicotine dependence: Secondary | ICD-10-CM

## 2019-07-16 DIAGNOSIS — R197 Diarrhea, unspecified: Secondary | ICD-10-CM | POA: Diagnosis present

## 2019-07-16 DIAGNOSIS — Z833 Family history of diabetes mellitus: Secondary | ICD-10-CM | POA: Diagnosis not present

## 2019-07-16 DIAGNOSIS — Z820 Family history of epilepsy and other diseases of the nervous system: Secondary | ICD-10-CM | POA: Diagnosis not present

## 2019-07-16 DIAGNOSIS — U071 COVID-19: Secondary | ICD-10-CM | POA: Diagnosis present

## 2019-07-16 DIAGNOSIS — B1920 Unspecified viral hepatitis C without hepatic coma: Secondary | ICD-10-CM | POA: Diagnosis present

## 2019-07-16 DIAGNOSIS — J1289 Other viral pneumonia: Secondary | ICD-10-CM | POA: Diagnosis present

## 2019-07-16 DIAGNOSIS — E785 Hyperlipidemia, unspecified: Secondary | ICD-10-CM | POA: Diagnosis present

## 2019-07-16 DIAGNOSIS — R0602 Shortness of breath: Secondary | ICD-10-CM | POA: Diagnosis present

## 2019-07-16 DIAGNOSIS — J1282 Pneumonia due to coronavirus disease 2019: Secondary | ICD-10-CM | POA: Diagnosis present

## 2019-07-16 LAB — TROPONIN I (HIGH SENSITIVITY)
Troponin I (High Sensitivity): 6 ng/L (ref <18)
Troponin I (High Sensitivity): 6 ng/L (ref <18)

## 2019-07-16 LAB — CBC
HCT: 50.6 % (ref 39.0–52.0)
Hemoglobin: 16.9 g/dL (ref 13.0–17.0)
MCH: 31.6 pg (ref 26.0–34.0)
MCHC: 33.4 g/dL (ref 30.0–36.0)
MCV: 94.6 fL (ref 80.0–100.0)
Platelets: 211 10*3/uL (ref 150–400)
RBC: 5.35 MIL/uL (ref 4.22–5.81)
RDW: 13.2 % (ref 11.5–15.5)
WBC: 5.8 10*3/uL (ref 4.0–10.5)
nRBC: 0 % (ref 0.0–0.2)

## 2019-07-16 LAB — HEPATIC FUNCTION PANEL
ALT: 45 U/L — ABNORMAL HIGH (ref 0–44)
AST: 73 U/L — ABNORMAL HIGH (ref 15–41)
Albumin: 3.2 g/dL — ABNORMAL LOW (ref 3.5–5.0)
Alkaline Phosphatase: 29 U/L — ABNORMAL LOW (ref 38–126)
Bilirubin, Direct: 0.4 mg/dL — ABNORMAL HIGH (ref 0.0–0.2)
Indirect Bilirubin: 1.4 mg/dL — ABNORMAL HIGH (ref 0.3–0.9)
Total Bilirubin: 1.8 mg/dL — ABNORMAL HIGH (ref 0.3–1.2)
Total Protein: 8.4 g/dL — ABNORMAL HIGH (ref 6.5–8.1)

## 2019-07-16 LAB — BASIC METABOLIC PANEL
Anion gap: 13 (ref 5–15)
BUN: 23 mg/dL — ABNORMAL HIGH (ref 6–20)
CO2: 25 mmol/L (ref 22–32)
Calcium: 8.5 mg/dL — ABNORMAL LOW (ref 8.9–10.3)
Chloride: 95 mmol/L — ABNORMAL LOW (ref 98–111)
Creatinine, Ser: 1.4 mg/dL — ABNORMAL HIGH (ref 0.61–1.24)
GFR calc Af Amer: >60 mL/min (ref >60)
GFR calc non Af Amer: 55 mL/min — ABNORMAL LOW (ref >60)
Glucose, Bld: 118 mg/dL — ABNORMAL HIGH (ref 70–99)
Potassium: 4.1 mmol/L (ref 3.5–5.1)
Sodium: 133 mmol/L — ABNORMAL LOW (ref 135–145)

## 2019-07-16 LAB — LACTIC ACID, PLASMA
Lactic Acid, Venous: 2 mmol/L (ref 0.5–1.9)
Lactic Acid, Venous: 3.1 mmol/L (ref 0.5–1.9)
Lactic Acid, Venous: 2.3 mmol/L (ref 0.5–1.9)

## 2019-07-16 LAB — MAGNESIUM: Magnesium: 2.6 mg/dL — ABNORMAL HIGH (ref 1.7–2.4)

## 2019-07-16 LAB — FERRITIN: Ferritin: 1857 ng/mL — ABNORMAL HIGH (ref 24–336)

## 2019-07-16 LAB — D-DIMER, QUANTITATIVE: D-Dimer, Quant: 3.1 ug/mL-FEU — ABNORMAL HIGH (ref 0.00–0.50)

## 2019-07-16 LAB — BRAIN NATRIURETIC PEPTIDE: B Natriuretic Peptide: 45 pg/mL (ref 0.0–100.0)

## 2019-07-16 LAB — FIBRINOGEN: Fibrinogen: 729 mg/dL — ABNORMAL HIGH (ref 210–475)

## 2019-07-16 LAB — SARS CORONAVIRUS 2 (TAT 6-24 HRS): SARS Coronavirus 2: POSITIVE — AB

## 2019-07-16 LAB — C-REACTIVE PROTEIN: CRP: 14.8 mg/dL — ABNORMAL HIGH (ref <1.0)

## 2019-07-16 MED ORDER — ENOXAPARIN SODIUM 40 MG/0.4ML ~~LOC~~ SOLN: 40.0000 mg | SUBCUTANEOUS | Status: DC

## 2019-07-16 MED ORDER — SODIUM CHLORIDE 0.9% FLUSH: 3.0000 mL | Freq: Once | INTRAVENOUS | Status: DC

## 2019-07-16 MED ORDER — SODIUM CHLORIDE 0.9 % IV BOLUS
1000.0000 mL | Freq: Once | INTRAVENOUS | Status: AC
  Administered 2019-07-16: 1000 mL via INTRAVENOUS

## 2019-07-16 MED ORDER — FOLIC ACID 1 MG PO TABS
1.0000 mg | ORAL_TABLET | Freq: Every day | ORAL | Status: DC
  Administered 2019-07-16 – 2019-07-21 (×6): 1 mg via ORAL
  Filled 2019-07-16 (×6): qty 1

## 2019-07-16 MED ORDER — SODIUM CHLORIDE 0.9 % IV SOLN
100.0000 mg | INTRAVENOUS | Status: AC
  Administered 2019-07-17: 100 mg via INTRAVENOUS
  Filled 2019-07-16: qty 20
  Filled 2019-07-16: qty 100
  Filled 2019-07-16: qty 20

## 2019-07-16 MED ORDER — SODIUM CHLORIDE 0.9 % IV SOLN
200.0000 mg | Freq: Once | INTRAVENOUS | Status: AC
  Administered 2019-07-16: 200 mg via INTRAVENOUS
  Filled 2019-07-16: qty 40

## 2019-07-16 MED ORDER — ZINC SULFATE 220 (50 ZN) MG PO CAPS
220.0000 mg | ORAL_CAPSULE | Freq: Every day | ORAL | Status: DC
  Administered 2019-07-16 – 2019-07-21 (×6): 220 mg via ORAL
  Filled 2019-07-16 (×6): qty 1

## 2019-07-16 MED ORDER — DEXAMETHASONE SODIUM PHOSPHATE 10 MG/ML IJ SOLN
6.0000 mg | INTRAMUSCULAR | Status: AC
  Administered 2019-07-16 – 2019-07-19 (×4): 6 mg via INTRAVENOUS
  Filled 2019-07-16 (×4): qty 1

## 2019-07-16 MED ORDER — IOHEXOL 350 MG/ML SOLN
80.0000 mL | Freq: Once | INTRAVENOUS | Status: AC | PRN
  Administered 2019-07-16: 80 mL via INTRAVENOUS

## 2019-07-16 NOTE — Progress Notes (Signed)
Pharmacy Note - Remdesivir Dosing  O:  ALT: 45 CXR (07/16/2019): multifocal patchy areas of ground-glass opacity which may represent multifocal pneumonia including possible COVID-19 Requiring supplemental O2: O2 sat 95% on 3L [place]   A: 58 yo male presented with SOB and weakness found to be positive for COVID-19. Patient meets criteria for remdesivir.   P: Begin remdesivir 200 mg IV x 1, followed by 100 mg IV daily x 4 days  Monitor ALT, clinical progress  Cristela Felt, PharmD PGY1 Pharmacy Resident [employer]: [phone]  07/16/2019 8:33 PM

## 2019-07-16 NOTE — H&P (Signed)
History and Physical  Dylan [name] [id number] DOB: [birth year] DOA: 07/16/2019  Referring physician: ER provider PCP: Patient, No Pcp Per  Outpatient Specialists:    Patient coming from: Home  Chief Complaint: Shortness of breath for 1 week.  HPI: Patient is a 58 year old male with past medical history significant for hep C, gallstones, COPD and liver cirrhosis.  Patient presents with 1 week history of fever, nonproductive cough, nonbloody watery diarrhea, intermittent nausea but no vomiting eyes shortness of breath with O2 sat of 85% on room air.  Apparently, patient's family advised patient to come to the hospital for further assessment and management but patient refused.  COVID-19 testing is pending.  No headache, no neck pain, no chest pain, no urinary symptoms.  Patient be admitted for further assessment and management.  ED Course: On presentation to the hospital, T-max was 101.5 F, heart rate of 104, blood pressure 107/80 mmHg respiratory rate of 22.  COVID-19 testing is pending.  Hospitalist team has been asked to admit patient.  Pertinent labs: Chemistry reveals sodium of 133, potassium of 4.1, chloride 95, CO2 of 25, BUN of 23, serum creatinine of 1.4, blood sugar of 118.  Magnesium is 2.6.  Cardiac BNP is 45.  Troponin is 6.  Lactic acid ranges from 2-3.1.  CBC reveals WBC of 5.8, hemoglobin 16.8, hematocrit of 50.6, MCV of 94.6 with platelet count of 211.  Fibrinogen is 729, D-dimer of 3.1.  COVID-19 test is still pending.  EKG: Independently reviewed.  Imaging: independently reviewed.  CT angio chest was negative for pulmonary embolism.  Reveals multifocal patchy areas of subpleural predominant groundglass opacity and developing consolidation.  Findings are said to fever multifocal pneumonia, including possible atypical viral etiology such as COVID-19.  Nodular hepatic surface contour, suggestive of cirrhosis was also reported.  Cholelithiasis was reported.  Review of Systems:   Negative for visual changes, sore throat, rash, new muscle aches, chest pain, dysuria, bleeding, n/v/abdominal pain.  Past Medical History:  Diagnosis Date   Cholelithiasis    Cirrhosis ([place])    COPD (chronic obstructive pulmonary disease) ([place])    1 year   Gallstones    Hepatitis C    3 years   PONV (postoperative nausea and vomiting)    Smoker    1/2 ppd for 40 years    Past Surgical History:  Procedure Laterality Date   COLONOSCOPY WITH PROPOFOL N/A 12/11/2014   Procedure: COLONOSCOPY WITH PROPOFOL;  Surgeon: Inda Castle, MD;  Location: [facility];  Service: Endoscopy;  Laterality: N/A;   ESOPHAGEAL BANDING N/A 12/11/2014   Procedure: ESOPHAGEAL BANDING;  Surgeon: Inda Castle, MD;  Location: [place];  Service: Endoscopy;  Laterality: N/A;   ESOPHAGOGASTRODUODENOSCOPY N/A 06/06/2017   Procedure: ESOPHAGOGASTRODUODENOSCOPY (EGD);  Surgeon: Yetta Flock, MD;  Location: [facility];  Service: Gastroenterology;  Laterality: N/A;   ESOPHAGOGASTRODUODENOSCOPY (EGD) WITH PROPOFOL N/A 12/11/2014   Procedure: ESOPHAGOGASTRODUODENOSCOPY (EGD) WITH PROPOFOL;  Surgeon: Inda Castle, MD;  Location: [facility];  Service: Endoscopy;  Laterality: N/A;   KNEE ARTHROSCOPY Right      reports that he quit smoking about 5 years ago. His smoking use included cigarettes. He has a 20.00 pack-year smoking history. He has never used smokeless tobacco. He reports current alcohol use. He reports current drug use.  Allergies  Allergen Reactions   Other Nausea And Vomiting    Narcotics- pt states he does not like to take narcotics d/t makes him sick    Family History  Problem Relation Age of Onset   Diabetes Mother    Alzheimer's disease Mother    Diabetes Maternal Grandmother    Mesothelioma Father    Colon cancer Neg Hx    Stomach cancer Neg Hx      Prior to Admission medications   Medication Sig Start Date End Date Taking? Authorizing Provider    Chlorphen-Phenyleph-ASA (ALKA-SELTZER PLUS COLD) 2-7.8-325 MG TBEF Take 2 tablets by mouth 2 (two) times daily as needed (cold symptoms).   Yes [provider]    Physical Exam: Vitals:   07/16/19 1715 07/16/19 1730 07/16/19 1745 07/16/19 1809  BP: 120/86 124/90 (!) 126/91 (!) 126/91  Pulse:    (!) 118  Resp: 20 (!) 21 (!) 23 20  Temp:      TempSrc:      SpO2:   96% 95%   Constitutional:   Appears calm and comfortable Eyes:   No pallor. No jaundice.  ENMT:   external ears, nose appear normal Neck:   Neck is supple. No JVD Respiratory:   Decreased air entry  Cardiovascular:   S1S2  No LE extremity edema   Abdomen:   Abdomen is soft and non tender. Organs are difficult to assess. Neurologic:   Awake and alert.  Moves all limbs.  Wt Readings from Last 3 Encounters:  12/16/18 76.7 kg  06/20/18 76.3 kg  01/26/18 76.2 kg    I have personally reviewed following labs and imaging studies  Labs on Admission:  CBC: Recent Labs  Lab 07/16/19 1153  WBC 5.8  HGB 16.9  HCT 50.6  MCV 94.6  PLT 123456   Basic Metabolic Panel: Recent Labs  Lab 07/16/19 1153 07/16/19 1455  NA 133*  --   K 4.1  --   CL 95*  --   CO2 25  --   GLUCOSE 118*  --   BUN 23*  --   CREATININE 1.40*  --   CALCIUM 8.5*  --   MG  --  2.6*   Liver Function Tests: Recent Labs  Lab 07/16/19 1455  AST 73*  ALT 45*  ALKPHOS 29*  BILITOT 1.8*  PROT 8.4*  ALBUMIN 3.2*   No results for input(s): LIPASE, AMYLASE in the last 168 hours. No results for input(s): AMMONIA in the last 168 hours. Coagulation Profile: No results for input(s): INR, PROTIME in the last 168 hours. Cardiac Enzymes: No results for input(s): CKTOTAL, CKMB, CKMBINDEX, TROPONINI in the last 168 hours. BNP (last 3 results) No results for input(s): PROBNP in the last 8760 hours. HbA1C: No results for input(s): HGBA1C in the last 72 hours. CBG: No results for input(s): GLUCAP in the last 168  hours. Lipid Profile: No results for input(s): CHOL, HDL, LDLCALC, TRIG, CHOLHDL, LDLDIRECT in the last 72 hours. Thyroid Function Tests: No results for input(s): TSH, T4TOTAL, FREET4, T3FREE, THYROIDAB in the last 72 hours. Anemia Panel: No results for input(s): VITAMINB12, FOLATE, FERRITIN, TIBC, IRON, RETICCTPCT in the last 72 hours. Urine analysis:    Component Value Date/Time   COLORURINE YELLOW 12/16/2018 1340   APPEARANCEUR CLEAR 12/16/2018 1340   LABSPEC 1.024 12/16/2018 1340   PHURINE 5.0 12/16/2018 1340   GLUCOSEU NEGATIVE 12/16/2018 1340   HGBUR NEGATIVE 12/16/2018 1340   BILIRUBINUR NEGATIVE 12/16/2018 1340   BILIRUBINUR negative 11/12/2014 1559   KETONESUR 20 (A) 12/16/2018 1340   PROTEINUR 100 (A) 12/16/2018 1340   UROBILINOGEN 1.0 11/12/2014 1559   NITRITE NEGATIVE 12/16/2018 1340   LEUKOCYTESUR NEGATIVE  12/16/2018 1340   Sepsis Labs: @LABRCNTIP (procalcitonin:4,lacticidven:4) )No results found for this or any previous visit (from the past 240 hour(s)).    Radiological Exams on Admission: Ct Angio Chest Pe W And/or Wo Contrast  Result Date: 07/16/2019 CLINICAL DATA:  Shortness of breath EXAM: CT ANGIOGRAPHY CHEST WITH CONTRAST TECHNIQUE: Multidetector CT imaging of the chest was performed using the standard protocol during bolus administration of intravenous contrast. Multiplanar CT image reconstructions and MIPs were obtained to evaluate the vascular anatomy. CONTRAST:  71mL OMNIPAQUE IOHEXOL 350 MG/ML SOLN COMPARISON:  Chest radiograph 07/16/2019 FINDINGS: Cardiovascular: Satisfactory opacification the pulmonary arteries to the segmental level. No pulmonary artery filling defects are identified. Central pulmonary arteries are normal caliber. No elevation of the RV/LV ratio (0.75). Normal heart size. No pericardial effusion. Normal caliber thoracic aorta. No acute luminal irregularity or periaortic stranding. Shared origin of the brachiocephalic and left common carotid  artery. The left vertebral artery arises directly from the aortic arch. Major venous structures are unremarkable. Mediastinum/Nodes: Scattered low attenuation subcentimeter mediastinal nodes. No pathologically enlarged mediastinal, hilar or axillary adenopathy. Thyroid gland and thoracic inlet are unremarkable. No acute abnormality of the trachea or esophagus. Lungs/Pleura: Multifocal patchy areas of subpleural predominant ground-glass opacity and developing consolidation on a background of more bandlike areas of subsegmental atelectasis. No pneumothorax. No effusion. No suspicious nodules or masses though evaluation limited by the underlying parenchymal process. Upper Abdomen: Nodular hepatic surface contour. Layering calcified gallstones within the gallbladder. Nonobstructing calculus in the upper pole right kidney. Musculoskeletal: Multilevel degenerative changes are present in the imaged portions of the spine. No acute osseous abnormality or suspicious osseous lesion. No concerning chest wall lesions. Mild bilateral gynecomastia. Mild posterior body wall edema. Review of the MIP images confirms the above findings. IMPRESSION: 1. No evidence of pulmonary embolism or evidence of acute aortic syndrome. 2. Multifocal patchy areas of subpleural predominant ground-glass opacity and developing consolidation on a background of more bandlike areas of subsegmental atelectasis. Findings are favored to represent multifocal pneumonia including a possible atypical viral etiology such as COVID-19. 3. Nodular hepatic surface contour, suggestive of cirrhosis. 4. Cholelithiasis. 5. Nonobstructing right nephrolithiasis. Electronically Signed   By: Lovena Le M.D.   On: 07/16/2019 16:44   Dg Chest Portable 1 View  Result Date: 07/16/2019 CLINICAL DATA:  Shortness of breath. EXAM: PORTABLE CHEST 1 VIEW COMPARISON:  Jan 26, 2018. FINDINGS: The heart size and mediastinal contours are within normal limits. Right lung is clear.  No pneumothorax or pleural effusion is noted. Mild left lingular opacity is noted concerning for pneumonia or atelectasis. The visualized skeletal structures are unremarkable. IMPRESSION: Mild left lingular opacity is noted concerning for pneumonia or atelectasis. Followup PA and lateral chest X-ray is recommended in 3-4 weeks following trial of antibiotic therapy to ensure resolution and exclude underlying malignancy. Electronically Signed   By: Marijo Conception M.D.   On: 07/16/2019 15:51    EKG: Independently reviewed.   Active Problems:   * No active hospital problems. *   Assessment/Plan Severe acute respiratory syndrome secondary to COVID-19 pneumonia: -Admit patient as a case of likely COVID-19 pneumonia under investigation. -Follow COVID-19 testing -Check inflammatory markers daily -Start patient on IV dexamethasone 6 Mg daily for 10 days -Low threshold to start remdesivir. -Folic acid -Zinc sulfate -Supportive care -Further management will depend on hospital course  COPD: -Stable.  DVT prophylaxis: Subcutaneous Lovenox Code Status: Full code Family Communication:  Disposition Plan: Admit patient for further assessment and management.  COVID-19 testing is pending Consults called: None Admission status: Inpatient.    Time spent: 65 minutes  Dana Allan, MD  [facility] Pager #: [phone] 7PM-7AM contact night coverage as above  07/16/2019, 7:29 PM
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
## 2019-07-16 NOTE — ED Triage Notes (Signed)
Pt here from home for weakness and diarrhea x 1 week. Orthostatic with EMS. Dark urine seen by EMS. Vomited upon standing, but nausea resolved. 500 cc bolus given PTA.

## 2019-07-17 DIAGNOSIS — U071 COVID-19: Secondary | ICD-10-CM | POA: Diagnosis present

## 2019-07-17 DIAGNOSIS — J1282 Pneumonia due to coronavirus disease 2019: Secondary | ICD-10-CM | POA: Diagnosis present

## 2019-07-17 LAB — COMPREHENSIVE METABOLIC PANEL
ALT: 37 U/L (ref 0–44)
AST: 58 U/L — ABNORMAL HIGH (ref 15–41)
Albumin: 2.5 g/dL — ABNORMAL LOW (ref 3.5–5.0)
Alkaline Phosphatase: 24 U/L — ABNORMAL LOW (ref 38–126)
Anion gap: 9 (ref 5–15)
BUN: 19 mg/dL (ref 6–20)
CO2: 22 mmol/L (ref 22–32)
Calcium: 8.1 mg/dL — ABNORMAL LOW (ref 8.9–10.3)
Chloride: 103 mmol/L (ref 98–111)
Creatinine, Ser: 1.04 mg/dL (ref 0.61–1.24)
GFR calc Af Amer: >60 mL/min (ref >60)
GFR calc non Af Amer: >60 mL/min (ref >60)
Glucose, Bld: 142 mg/dL — ABNORMAL HIGH (ref 70–99)
Potassium: 4.6 mmol/L (ref 3.5–5.1)
Sodium: 134 mmol/L — ABNORMAL LOW (ref 135–145)
Total Bilirubin: 1.2 mg/dL (ref 0.3–1.2)
Total Protein: 6.8 g/dL (ref 6.5–8.1)

## 2019-07-17 LAB — CBC WITH DIFFERENTIAL/PLATELET
Abs Immature Granulocytes: 0.01 10*3/uL (ref 0.00–0.07)
Basophils Absolute: 0 10*3/uL (ref 0.0–0.1)
Basophils Relative: 0 %
Eosinophils Absolute: 0 10*3/uL (ref 0.0–0.5)
Eosinophils Relative: 0 %
HCT: 46.1 % (ref 39.0–52.0)
Hemoglobin: 15.2 g/dL (ref 13.0–17.0)
Immature Granulocytes: 0 %
Lymphocytes Relative: 8 %
Lymphs Abs: 0.3 10*3/uL — ABNORMAL LOW (ref 0.7–4.0)
MCH: 31.1 pg (ref 26.0–34.0)
MCHC: 33 g/dL (ref 30.0–36.0)
MCV: 94.3 fL (ref 80.0–100.0)
Monocytes Absolute: 0.2 10*3/uL (ref 0.1–1.0)
Monocytes Relative: 5 %
Neutro Abs: 3.5 10*3/uL (ref 1.7–7.7)
Neutrophils Relative %: 87 %
Platelets: 187 10*3/uL (ref 150–400)
RBC: 4.89 MIL/uL (ref 4.22–5.81)
RDW: 13.2 % (ref 11.5–15.5)
WBC: 4.1 10*3/uL (ref 4.0–10.5)
nRBC: 0 % (ref 0.0–0.2)

## 2019-07-17 LAB — HIV ANTIBODY (ROUTINE TESTING W REFLEX): HIV Screen 4th Generation wRfx: NONREACTIVE

## 2019-07-17 LAB — ABO/RH: ABO/RH(D): A POS

## 2019-07-17 LAB — D-DIMER, QUANTITATIVE: D-Dimer, Quant: 7.36 ug/mL-FEU — ABNORMAL HIGH (ref 0.00–0.50)

## 2019-07-17 LAB — FERRITIN: Ferritin: 1588 ng/mL — ABNORMAL HIGH (ref 24–336)

## 2019-07-17 LAB — TRIGLYCERIDES: Triglycerides: 94 mg/dL (ref <150)

## 2019-07-17 LAB — PROCALCITONIN: Procalcitonin: 0.19 ng/mL

## 2019-07-17 LAB — LACTATE DEHYDROGENASE: LDH: 325 U/L — ABNORMAL HIGH (ref 98–192)

## 2019-07-17 LAB — FIBRINOGEN: Fibrinogen: 704 mg/dL — ABNORMAL HIGH (ref 210–475)

## 2019-07-17 LAB — MAGNESIUM: Magnesium: 2.5 mg/dL — ABNORMAL HIGH (ref 1.7–2.4)

## 2019-07-17 LAB — C-REACTIVE PROTEIN: CRP: 12.4 mg/dL — ABNORMAL HIGH (ref <1.0)

## 2019-07-17 MED ORDER — SODIUM CHLORIDE 0.9 % IV SOLN
100.0000 mg | Freq: Every day | INTRAVENOUS | Status: AC
  Administered 2019-07-18 – 2019-07-20 (×3): 100 mg via INTRAVENOUS
  Filled 2019-07-17 (×3): qty 100

## 2019-07-17 MED ORDER — VITAMIN C 500 MG PO TABS
500.0000 mg | ORAL_TABLET | Freq: Every day | ORAL | Status: DC
  Administered 2019-07-17 – 2019-07-21 (×5): 500 mg via ORAL
  Filled 2019-07-17 (×4): qty 1

## 2019-07-17 MED ORDER — ENOXAPARIN SODIUM 40 MG/0.4ML ~~LOC~~ SOLN
40.0000 mg | Freq: Two times a day (BID) | SUBCUTANEOUS | Status: DC
  Administered 2019-07-17 – 2019-07-21 (×8): 40 mg via SUBCUTANEOUS
  Filled 2019-07-17 (×8): qty 0.4

## 2019-07-17 NOTE — ED Notes (Signed)
Dinner tray given to pt.  No complaints voiced.  Pt updated on plan of care

## 2019-07-17 NOTE — ED Notes (Signed)
Pt resting at this time.  Lunch tray given.

## 2019-07-17 NOTE — Progress Notes (Signed)
PROGRESS NOTE    [name]  [id number] DOB: 1961/01/26 DOA: 07/16/2019 PCP: Patient, No Pcp Per   Brief Narrative:   Patient is Dylan [name] 58 year old male with past medical history significant for hep C, gallstones, COPD and liver cirrhosis.  Patient presents with 1 week history of fever, nonproductive cough, nonbloody watery diarrhea, intermittent nausea but no vomiting eyes shortness of breath with O2 sat of 85% on room air.  Apparently, patient's family advised patient to come to the hospital for further assessment and management but patient refused.  COVID-19 testing is pending.  No headache, no neck pain, no chest pain, no urinary symptoms.  Patient be admitted for further assessment and management.  ED Course: On presentation to the hospital, T-max was 101.5 F, heart rate of 104, blood pressure 107/80 mmHg respiratory rate of 22.  COVID-19 testing is pending.  Hospitalist team has been asked to admit patient.  Assessment & Plan:   Active Problems:   Pneumonia due to severe acute respiratory syndrome coronavirus 2 (SARS-CoV-2)   Pneumonia due to COVID-19 virus  Severe acute respiratory syndrome secondary to COVID-19 pneumonia: - currently comfortable on 3 L [place] - continue steroids and remdesivir - CT chest without PE, but multifocal patchy areas of subpleural predominant ground glass opacity and developing consolidation - I think he'd be reasonable candidate for convalescent plasma.  I discussed possibility of convalescent plasma with him today, he was not interested.  Will bring this up again as able.   - follow inflammatory markers - elevated d dimer, place on high dose DVT prophylaxis -Folic acid -Zinc sulfate -Supportive care -transfer to [facility] when bed available  COVID-19 Labs  Recent Labs    07/16/19 1755 07/17/19 0416  DDIMER 3.10* 7.36*  FERRITIN 1,857* 1,588*  LDH  --  325*  CRP 14.8* 12.4*    Lab Results  Component Value Date   SARSCOV2NAA POSITIVE (Denham Mose)  07/16/2019   Elevated D dimer: CT PE protocol without PE.  Will continue on high dose DVT prophylaxis and follow LE [place].   COPD: -Stable.  Cirrhosis: on imaging, follow outpatient - likely related to hepatitis C  DVT prophylaxis: lovenox BID Code Status: full  Family Communication:none at bedside Disposition Plan: pending further improvement  Consultants:   none  Procedures:   none  Antimicrobials: Anti-infectives (From admission, onward)   Start     Dose/Rate Route Frequency Ordered Stop   07/17/19 2027  remdesivir 100 mg in sodium chloride 0.9 % 250 mL IVPB     100 mg 500 mL/hr over 30 Minutes Intravenous Every 24 hours 07/16/19 2027 07/21/19 2029   07/16/19 2030  remdesivir 200 mg in sodium chloride 0.9 % 250 mL IVPB     200 mg 500 mL/hr over 30 Minutes Intravenous Once 07/16/19 2027 07/16/19 2228     Subjective: C/o shortness of breath No chest pain  Objective: Vitals:   07/17/19 1530 07/17/19 1545 07/17/19 1600 07/17/19 1615  BP: (!) 117/93 (!) 128/92 121/90 (!) 128/94  Pulse:      Resp:    20  Temp:      TempSrc:      SpO2:        Intake/Output Summary (Last 24 hours) at 07/17/2019 1914 Last data filed at 07/17/2019 0042 Gross per 24 hour  Intake 2781.55 ml  Output -  Net 2781.55 ml   There were no vitals filed for this visit.  Examination:  General exam: Appears calm and comfortable  Respiratory system:  slightly labored, no accessory muscle use Cardiovascular system:RRR Gastrointestinal system: Abdomen is nondistended, soft and nontender.  Central nervous system: Alert and oriented. No focal neurological deficits. Extremities: no LEE Skin: No rashes, lesions or ulcers Psychiatry: Judgement and insight appear normal. Mood & affect appropriate.   Data Reviewed: I have personally reviewed following labs and imaging studies  CBC: Recent Labs  Lab 07/16/19 1153 07/17/19 0416  WBC 5.8 4.1  NEUTROABS  --  3.5  HGB 16.9 15.2  HCT 50.6 46.1   MCV 94.6 94.3  PLT 211 [id number]   Basic Metabolic Panel: Recent Labs  Lab 07/16/19 1153 07/16/19 1455 07/17/19 0416  NA 133*  --  134*  K 4.1  --  4.6  CL 95*  --  103  CO2 25  --  22  GLUCOSE 118*  --  142*  BUN 23*  --  19  CREATININE 1.40*  --  1.04  CALCIUM 8.5*  --  8.1*  MG  --  2.6* 2.5*   GFR: CrCl cannot be calculated (Unknown ideal weight.). Liver Function Tests: Recent Labs  Lab 07/16/19 1455 07/17/19 0416  AST 73* 58*  ALT 45* 37  ALKPHOS 29* 24*  BILITOT 1.8* 1.2  PROT 8.4* 6.8  ALBUMIN 3.2* 2.5*   No results for input(s): LIPASE, AMYLASE in the last 168 hours. No results for input(s): AMMONIA in the last 168 hours. Coagulation Profile: No results for input(s): INR, PROTIME in the last 168 hours. Cardiac Enzymes: No results for input(s): CKTOTAL, CKMB, CKMBINDEX, TROPONINI in the last 168 hours. BNP (last 3 results) No results for input(s): PROBNP in the last 8760 hours. HbA1C: No results for input(s): HGBA1C in the last 72 hours. CBG: No results for input(s): GLUCAP in the last 168 hours. Lipid Profile: Recent Labs    07/17/19 0416  TRIG 94   Thyroid Function Tests: No results for input(s): TSH, T4TOTAL, FREET4, T3FREE, THYROIDAB in the last 72 hours. Anemia Panel: Recent Labs    07/16/19 1755 07/17/19 0416  FERRITIN 1,857* 1,588*   Sepsis Labs: Recent Labs  Lab 07/16/19 1153 07/16/19 1512 07/16/19 1942 07/17/19 0416  PROCALCITON  --   --   --  0.19  LATICACIDVEN 2.0* 3.1* 2.3*  --     Recent Results (from the past 240 hour(s))  SARS CORONAVIRUS 2 (TAT 6-24 HRS) Nasopharyngeal Nasopharyngeal Swab     Status: Abnormal   Collection Time: 07/16/19  3:12 PM   Specimen: Nasopharyngeal Swab  Result Value Ref Range Status   SARS Coronavirus 2 POSITIVE (Lorrayne Ismael) NEGATIVE Final    Comment: RESULT CALLED TO, READ BACK BY AND VERIFIED WITH: J FERRAINOLO,RN 2000 07/16/2019 D BRADLEY (NOTE) SARS-CoV-2 target nucleic acids are DETECTED. The  SARS-CoV-2 RNA is generally detectable in upper and lower respiratory specimens during the acute phase of infection. Positive results are indicative of active infection with SARS-CoV-2. Clinical  correlation with patient history and other diagnostic information is necessary to determine patient infection status. Positive results do  not rule out bacterial infection or co-infection with other viruses. The expected result is Negative. Fact Sheet for Patients: SugarRoll.be Fact Sheet for Healthcare Providers: https://www.woods-mathews.com/ This test is not yet approved or cleared by the [place] FDA and  has been authorized for detection and/or diagnosis of SARS-CoV-2 by FDA under an Emergency Use Authorization (EUA). This EUA will remain  in effect (meaning this test can be used)  for the duration of the COVID-19 declaration under Section 564(b)(1) of  the Act, 21 U.S.C. section 360bbb-3(b)(1), unless the authorization is terminated or revoked sooner. Performed at [facility], [place] [address] [place], [place] [address]   Blood Culture (routine x 2)     Status: None (Preliminary result)   Collection Time: 07/16/19  5:50 PM   Specimen: BLOOD  Result Value Ref Range Status   Specimen Description BLOOD RIGHT ANTECUBITAL  Final   Special Requests   Final    BOTTLES DRAWN AEROBIC AND ANAEROBIC Blood Culture adequate volume   Culture   Final    NO GROWTH < 24 HOURS Performed at [facility], [place] [address] [place], [place] [address]    Report Status PENDING  Incomplete  Blood Culture (routine x 2)     Status: None (Preliminary result)   Collection Time: 07/16/19  5:55 PM   Specimen: BLOOD  Result Value Ref Range Status   Specimen Description BLOOD LEFT ANTECUBITAL  Final   Special Requests   Final    BOTTLES DRAWN AEROBIC AND ANAEROBIC Blood Culture adequate volume   Culture   Final    NO GROWTH < 24 HOURS Performed at [facility], [place] [address] [place], [place] [address]    Report Status PENDING  Incomplete         Radiology Studies: Ct Angio Chest Pe W And/or Wo Contrast  Result Date: 07/16/2019 CLINICAL DATA:  Shortness of breath EXAM: CT ANGIOGRAPHY CHEST WITH CONTRAST TECHNIQUE: Multidetector CT imaging of the chest was performed using the standard protocol during bolus administration of intravenous contrast. Multiplanar CT image reconstructions and MIPs were obtained to evaluate the vascular anatomy. CONTRAST:  27mL OMNIPAQUE IOHEXOL 350 MG/ML SOLN COMPARISON:  Chest radiograph 07/16/2019 FINDINGS: Cardiovascular: Satisfactory opacification the pulmonary arteries to the segmental level. No pulmonary artery filling defects are identified. Central pulmonary arteries are normal caliber. No elevation of the RV/LV ratio (0.75). Normal heart size. No pericardial effusion. Normal caliber thoracic aorta. No acute luminal irregularity or periaortic stranding. Shared origin of the brachiocephalic and left common carotid artery. The left vertebral artery arises directly from the aortic arch. Major venous structures are unremarkable. Mediastinum/Nodes: Scattered low attenuation subcentimeter mediastinal nodes. No pathologically enlarged mediastinal, hilar or axillary adenopathy. Thyroid gland and thoracic inlet are unremarkable. No acute abnormality of the trachea or esophagus. Lungs/Pleura: Multifocal patchy areas of subpleural predominant ground-glass opacity and developing consolidation on Kylle Lall background of more bandlike areas of subsegmental atelectasis. No pneumothorax. No effusion. No suspicious nodules or masses though evaluation limited by the underlying parenchymal process. Upper Abdomen: Nodular hepatic surface contour. Layering calcified gallstones within the gallbladder. Nonobstructing calculus in the upper pole right kidney. Musculoskeletal: Multilevel degenerative changes are present in the imaged portions of  the spine. No acute osseous abnormality or suspicious osseous lesion. No concerning chest wall lesions. Mild bilateral gynecomastia. Mild posterior body wall edema. Review of the MIP images confirms the above findings. IMPRESSION: 1. No evidence of pulmonary embolism or evidence of acute aortic syndrome. 2. Multifocal patchy areas of subpleural predominant ground-glass opacity and developing consolidation on Daleiza Bacchi background of more bandlike areas of subsegmental atelectasis. Findings are favored to represent multifocal pneumonia including Madeline Bebout possible atypical viral etiology such as COVID-19. 3. Nodular hepatic surface contour, suggestive of cirrhosis. 4. Cholelithiasis. 5. Nonobstructing right nephrolithiasis. Electronically Signed   By: Lovena Le M.D.   On: 07/16/2019 16:44   Dg Chest Portable 1 View  Result Date: 07/16/2019 CLINICAL DATA:  Shortness of breath.  EXAM: PORTABLE CHEST 1 VIEW COMPARISON:  Jan 26, 2018. FINDINGS: The heart size and mediastinal contours are within normal limits. Right lung is clear. No pneumothorax or pleural effusion is noted. Mild left lingular opacity is noted concerning for pneumonia or atelectasis. The visualized skeletal structures are unremarkable. IMPRESSION: Mild left lingular opacity is noted concerning for pneumonia or atelectasis. Followup PA and lateral chest X-ray is recommended in 3-4 weeks following trial of antibiotic therapy to ensure resolution and exclude underlying malignancy. Electronically Signed   By: Marijo Conception M.D.   On: 07/16/2019 15:51        Scheduled Meds: . dexamethasone (DECADRON) injection  6 mg Intravenous Q24H  . enoxaparin (LOVENOX) injection  40 mg Subcutaneous Q12H  . folic acid  1 mg Oral Daily  . sodium chloride flush  3 mL Intravenous Once  . sodium chloride flush  3 mL Intravenous Once  . zinc sulfate  220 mg Oral Daily   Continuous Infusions: . remdesivir 100 mg in NS 250 mL       LOS: 1 day    Time spent: over 30  min    Fayrene Helper, MD [facility] Pager AMION  If 7PM-7AM, please contact night-coverage [email or web address] Password [facility] 07/17/2019, 7:14 PM

## 2019-07-17 NOTE — ED Notes (Signed)
Ordered diet tray for pt  

## 2019-07-17 NOTE — ED Notes (Signed)
Attempted report, nurse will call right back

## 2019-07-17 NOTE — ED Notes (Signed)
Pt updated on plan of care.  Voices understanding

## 2019-07-17 NOTE — Plan of Care (Signed)
  Problem: Education: Goal: Knowledge of risk factors and measures for prevention of condition will improve Outcome: Progressing   Problem: Coping: Goal: Psychosocial and spiritual needs will be supported Outcome: Progressing   Problem: Respiratory: Goal: Will maintain a patent airway Outcome: Progressing Goal: Complications related to the disease process, condition or treatment will be avoided or minimized Outcome: Progressing   

## 2019-07-17 NOTE — ED Notes (Addendum)
Pt is on carelink list to be transported to [facility], there are several people ahead of them

## 2019-07-17 NOTE — ED Notes (Signed)
Breakfast Ordered 

## 2019-07-17 NOTE — ED Notes (Signed)
Lunch ordered 

## 2019-07-18 ENCOUNTER — Encounter (HOSPITAL_COMMUNITY): Payer: Self-pay

## 2019-07-18 ENCOUNTER — Inpatient Hospital Stay (HOSPITAL_COMMUNITY): Payer: HRSA Program

## 2019-07-18 ENCOUNTER — Other Ambulatory Visit: Payer: Self-pay

## 2019-07-18 DIAGNOSIS — R7989 Other specified abnormal findings of blood chemistry: Secondary | ICD-10-CM

## 2019-07-18 DIAGNOSIS — J9601 Acute respiratory failure with hypoxia: Secondary | ICD-10-CM

## 2019-07-18 DIAGNOSIS — R197 Diarrhea, unspecified: Secondary | ICD-10-CM

## 2019-07-18 LAB — COMPREHENSIVE METABOLIC PANEL
ALT: 33 U/L (ref 0–44)
AST: 48 U/L — ABNORMAL HIGH (ref 15–41)
Albumin: 2.7 g/dL — ABNORMAL LOW (ref 3.5–5.0)
Alkaline Phosphatase: 23 U/L — ABNORMAL LOW (ref 38–126)
Anion gap: 10 (ref 5–15)
BUN: 23 mg/dL — ABNORMAL HIGH (ref 6–20)
CO2: 23 mmol/L (ref 22–32)
Calcium: 8.4 mg/dL — ABNORMAL LOW (ref 8.9–10.3)
Chloride: 101 mmol/L (ref 98–111)
Creatinine, Ser: 0.96 mg/dL (ref 0.61–1.24)
GFR calc Af Amer: >60 mL/min (ref >60)
GFR calc non Af Amer: >60 mL/min (ref >60)
Glucose, Bld: 140 mg/dL — ABNORMAL HIGH (ref 70–99)
Potassium: 3.9 mmol/L (ref 3.5–5.1)
Sodium: 134 mmol/L — ABNORMAL LOW (ref 135–145)
Total Bilirubin: 0.7 mg/dL (ref 0.3–1.2)
Total Protein: 6.8 g/dL (ref 6.5–8.1)

## 2019-07-18 LAB — CBC WITH DIFFERENTIAL/PLATELET
Abs Immature Granulocytes: 0.04 10*3/uL (ref 0.00–0.07)
Basophils Absolute: 0 10*3/uL (ref 0.0–0.1)
Basophils Relative: 0 %
Eosinophils Absolute: 0 10*3/uL (ref 0.0–0.5)
Eosinophils Relative: 0 %
HCT: 41.4 % (ref 39.0–52.0)
Hemoglobin: 13.8 g/dL (ref 13.0–17.0)
Immature Granulocytes: 1 %
Lymphocytes Relative: 5 %
Lymphs Abs: 0.4 10*3/uL — ABNORMAL LOW (ref 0.7–4.0)
MCH: 31.4 pg (ref 26.0–34.0)
MCHC: 33.3 g/dL (ref 30.0–36.0)
MCV: 94.1 fL (ref 80.0–100.0)
Monocytes Absolute: 0.4 10*3/uL (ref 0.1–1.0)
Monocytes Relative: 6 %
Neutro Abs: 6.3 10*3/uL (ref 1.7–7.7)
Neutrophils Relative %: 88 %
Platelets: 214 10*3/uL (ref 150–400)
RBC: 4.4 MIL/uL (ref 4.22–5.81)
RDW: 13.3 % (ref 11.5–15.5)
WBC: 7.1 10*3/uL (ref 4.0–10.5)
nRBC: 0 % (ref 0.0–0.2)

## 2019-07-18 LAB — MAGNESIUM: Magnesium: 2.4 mg/dL (ref 1.7–2.4)

## 2019-07-18 LAB — D-DIMER, QUANTITATIVE: D-Dimer, Quant: 12.48 ug/mL-FEU — ABNORMAL HIGH (ref 0.00–0.50)

## 2019-07-18 LAB — C-REACTIVE PROTEIN: CRP: 7.4 mg/dL — ABNORMAL HIGH (ref <1.0)

## 2019-07-18 LAB — FERRITIN: Ferritin: 1062 ng/mL — ABNORMAL HIGH (ref 24–336)

## 2019-07-18 NOTE — Progress Notes (Signed)
Dylan [name]  [id number] DOB: [birth year] DOA: 07/16/2019 PCP: Patient, No Pcp Per    Brief Narrative:  58 year old with a history of hepatitis C, cholelithiasis, cirrhosis of the liver, and COPD who presented to [facility] long ED with a 1 week history of fever, shortness of breath, nonproductive cough, nonbloody watery diarrhea, and intermittent nausea.  He was found to have an O2 sat of 85% on room air.  He was confirmed to be Covid positive.  He was initially admitted to [facility] but eventually was able to be transferred to [facility] once a bed became available.  Significant Events: 11/17 admit via [facility] ED 11/18 transferred to [facility] 11/19 venous duplex bilateral lower extremities  COVID-19 specific Treatment: Decadron Remdesivir Convalescent plasma -refused  Subjective: Appears to be stable on relatively minimal oxygen support at present.  Reports that he feels very short of breath with the slightest of exertion.  Denies chest pain nausea or vomiting.  Did have diarrhea but this appears to be resolving.  No nausea or vomiting.  Assessment & Plan:  Covid pneumonia -acute hypoxic respiratory failure CT chest confirmed multifocal patchy pulmonary infiltrates -continue Decadron and remdesivir -patient refused convalescent plasma -monitor closely  Recent Labs  Lab 07/16/19 1455 07/16/19 1755 07/17/19 0416 07/18/19 0344  DDIMER  --  3.10* 7.36* 12.48*  FERRITIN  --  1,857* 1,588* 1,062*  CRP  --  14.8* 12.4* 7.4*  ALT 7*  --  32 33  PROCALCITON  --   --  0.19  --    COPD Well compensated at this time  Cirrhosis of the Liver - Hepatitis C By history it seems the patient was likely treated with interferon in the distant past -appears stable at this time  DVT prophylaxis: Lovenox Code Status: FULL CODE Family Communication:  Disposition Plan: Telemetry day  Consultants:  none  Antimicrobials:  None  Objective: Blood pressure 125/86, pulse 95,  temperature 98.6 F (37 C), temperature source Oral, resp. rate 18, height 5\' 7"  (1.702 m), weight 75.7 kg, SpO2 94 %. No intake or output data in the 24 hours ending 07/18/19 0911 Filed Weights   07/18/19 0053  Weight: 75.7 kg    Examination: General: No acute respiratory distress at rest Lungs: Fine crackles bibasilar fields Cardiovascular: Regular rate and rhythm without murmur gallop or rub normal S1 and S2 Abdomen: Nontender, nondistended, soft, bowel sounds positive, no rebound, no ascites, no appreciable mass Extremities: No significant cyanosis, clubbing, or edema bilateral lower extremities  CBC: Recent Labs  Lab 07/16/19 1153 07/17/19 0416 07/18/19 0344  WBC 5.8 4.1 7.1  NEUTROABS  --  3.5 6.3  HGB 16.9 15.2 13.8  HCT 50.6 46.1 41.4  MCV 94.6 94.3 94.1  PLT 211 187 [id number]   Basic Metabolic Panel: Recent Labs  Lab 07/16/19 1153 07/16/19 1455 07/17/19 0416 07/18/19 0344  NA 133*  --  134* 134*  K 4.1  --  4.6 3.9  CL 95*  --  103 101  CO2 25  --  22 23  GLUCOSE 118*  --  142* 140*  BUN 23*  --  19 23*  CREATININE 1.40*  --  1.04 0.96  CALCIUM 8.5*  --  8.1* 8.4*  MG  --  2.6* 2.5* 2.4   GFR: Estimated Creatinine Clearance: 77.5 mL/min (by C-G formula based on SCr of 0.96 mg/dL).  Liver Function Tests: Recent Labs  Lab 07/16/19 1455 07/17/19 0416 07/18/19 0344  AST 73* 58* 48*  ALT 45* 37 33  ALKPHOS 29* 24* 23*  BILITOT 1.8* 1.2 0.7  PROT 8.4* 6.8 6.8  ALBUMIN 3.2* 2.5* 2.7*     Recent Results (from the past 240 hour(s))  SARS CORONAVIRUS 2 (TAT 6-24 HRS) Nasopharyngeal Nasopharyngeal Swab     Status: Abnormal   Collection Time: 07/16/19  3:12 PM   Specimen: Nasopharyngeal Swab  Result Value Ref Range Status   SARS Coronavirus 2 POSITIVE (A) NEGATIVE Final    Comment: RESULT CALLED TO, READ BACK BY AND VERIFIED WITH: J FERRAINOLO,RN 2000 07/16/2019 D BRADLEY (NOTE) SARS-CoV-2 target nucleic acids are DETECTED. The SARS-CoV-2 RNA is  generally detectable in upper and lower respiratory specimens during the acute phase of infection. Positive results are indicative of active infection with SARS-CoV-2. Clinical  correlation with patient history and other diagnostic information is necessary to determine patient infection status. Positive results do  not rule out bacterial infection or co-infection with other viruses. The expected result is Negative. Fact Sheet for Patients: SugarRoll.be Fact Sheet for Healthcare Providers: https://www.woods-mathews.com/ This test is not yet approved or cleared by the [place] FDA and  has been authorized for detection and/or diagnosis of SARS-CoV-2 by FDA under an Emergency Use Authorization (EUA). This EUA will remain  in effect (meaning this test can be used)  for the duration of the COVID-19 declaration under Section 564(b)(1) of the Act, 21 U.S.C. section 360bbb-3(b)(1), unless the authorization is terminated or revoked sooner. Performed at [facility], [place] [address], [place], [place] [address]   Blood Culture (routine x 2)     Status: None (Preliminary result)   Collection Time: 07/16/19  5:50 PM   Specimen: BLOOD  Result Value Ref Range Status   Specimen Description BLOOD RIGHT ANTECUBITAL  Final   Special Requests   Final    BOTTLES DRAWN AEROBIC AND ANAEROBIC Blood Culture adequate volume   Culture   Final    NO GROWTH < 24 HOURS Performed at [facility], [place] [address] [place], [place] [address]    Report Status PENDING  Incomplete  Blood Culture (routine x 2)     Status: None (Preliminary result)   Collection Time: 07/16/19  5:55 PM   Specimen: BLOOD  Result Value Ref Range Status   Specimen Description BLOOD LEFT ANTECUBITAL  Final   Special Requests   Final    BOTTLES DRAWN AEROBIC AND ANAEROBIC Blood Culture adequate volume   Culture   Final    NO GROWTH < 24 HOURS Performed at [facility],  [place] [address], [place], [place] [address]    Report Status PENDING  Incomplete     Scheduled Meds: . dexamethasone (DECADRON) injection  6 mg Intravenous Q24H  . enoxaparin (LOVENOX) injection  40 mg Subcutaneous Q12H  . folic acid  1 mg Oral Daily  . sodium chloride flush  3 mL Intravenous Once  . sodium chloride flush  3 mL Intravenous Once  . vitamin C  500 mg Oral Daily  . zinc sulfate  220 mg Oral Daily   Continuous Infusions: . remdesivir 100 mg in NS 250 mL       LOS: 2 days   Cherene Altes, MD [facility] Office  [phone] Pager - Text Page per Amion  If 7PM-7AM, please contact night-coverage per Amion 07/18/2019, 9:11 AM

## 2019-07-18 NOTE — Progress Notes (Signed)
Initial Nutrition Assessment  DOCUMENTATION CODES:   Not applicable  INTERVENTION:   Ensure Enlive po TID, each supplement provides 350 kcal and 20 grams of protein  Pt receiving Hormel Shake daily with Breakfast which provides 520 kcals and 22 g of protein and Magic cup BID with lunch and dinner, each supplement provides 290 kcal and 9 grams of protein, automatically on meal trays to optimize nutritional intake.     NUTRITION DIAGNOSIS:   Inadequate oral intake related to acute illness as evidenced by per patient/family report.  GOAL:   Patient will meet greater than or equal to 90% of their needs  MONITOR:   PO intake, Supplement acceptance, Labs, Weight trends  REASON FOR ASSESSMENT:   Malnutrition Screening Tool    ASSESSMENT:   58 yo male admitted with acute respiratory failure due to COVID-19 pneumonia. PMH includes hepatitis C, gallstones, COPD, liver cirrhosis   No recorded po intake   Unable to obtain diet and weight history at this time. No weight loss per weight encounters.   Labs: reviewed Meds: decadron, folic acid, Vit C, zinc sulfate   NUTRITION - FOCUSED PHYSICAL EXAM:  Unable to assess  Diet Order:   Diet Order            Diet regular Room service appropriate? Yes; Fluid consistency: Thin  Diet effective now              EDUCATION NEEDS:   Not appropriate for education at this time  Skin:  Skin Assessment: Reviewed RN Assessment  Last BM:  11/18  Height:   Ht Readings from Last 1 Encounters:  07/18/19 5\' 7"  (1.702 m)    Weight:   Wt Readings from Last 1 Encounters:  07/18/19 75.7 kg    BMI:  Body mass index is 26.14 kg/m.  Estimated Nutritional Needs:   Kcal:  2100-2300 kcals  Protein:  105-115 g  Fluid:  >/= 2 L   Cate Thurley Francesconi MS, RDN, LDN, CNSC [phone] Pager  [phone] Weekend/On-Call Pager

## 2019-07-18 NOTE — Progress Notes (Signed)
RN left a message at [employer]. RN called to give daily uptade

## 2019-07-18 NOTE — Progress Notes (Signed)
Bilateral lower extremity venous duplex has been completed. Preliminary results can be found in CV Proc through chart review.   07/18/19 12:54 PM Carlos Levering RVT

## 2019-07-19 ENCOUNTER — Encounter (HOSPITAL_COMMUNITY): Payer: Self-pay

## 2019-07-19 LAB — CBC WITH DIFFERENTIAL/PLATELET
Abs Immature Granulocytes: 0.04 10*3/uL (ref 0.00–0.07)
Basophils Absolute: 0 10*3/uL (ref 0.0–0.1)
Basophils Relative: 0 %
Eosinophils Absolute: 0 10*3/uL (ref 0.0–0.5)
Eosinophils Relative: 0 %
HCT: 40.9 % (ref 39.0–52.0)
Hemoglobin: 13.4 g/dL (ref 13.0–17.0)
Immature Granulocytes: 1 %
Lymphocytes Relative: 5 %
Lymphs Abs: 0.4 10*3/uL — ABNORMAL LOW (ref 0.7–4.0)
MCH: 30.8 pg (ref 26.0–34.0)
MCHC: 32.8 g/dL (ref 30.0–36.0)
MCV: 94 fL (ref 80.0–100.0)
Monocytes Absolute: 0.4 10*3/uL (ref 0.1–1.0)
Monocytes Relative: 5 %
Neutro Abs: 6.3 10*3/uL (ref 1.7–7.7)
Neutrophils Relative %: 89 %
Platelets: 238 10*3/uL (ref 150–400)
RBC: 4.35 MIL/uL (ref 4.22–5.81)
RDW: 13.3 % (ref 11.5–15.5)
WBC: 7 10*3/uL (ref 4.0–10.5)
nRBC: 0 % (ref 0.0–0.2)

## 2019-07-19 LAB — COMPREHENSIVE METABOLIC PANEL
ALT: 34 U/L (ref 0–44)
AST: 53 U/L — ABNORMAL HIGH (ref 15–41)
Albumin: 2.7 g/dL — ABNORMAL LOW (ref 3.5–5.0)
Alkaline Phosphatase: 26 U/L — ABNORMAL LOW (ref 38–126)
Anion gap: 11 (ref 5–15)
BUN: 18 mg/dL (ref 6–20)
CO2: 22 mmol/L (ref 22–32)
Calcium: 8.4 mg/dL — ABNORMAL LOW (ref 8.9–10.3)
Chloride: 104 mmol/L (ref 98–111)
Creatinine, Ser: 0.96 mg/dL (ref 0.61–1.24)
GFR calc Af Amer: >60 mL/min (ref >60)
GFR calc non Af Amer: >60 mL/min (ref >60)
Glucose, Bld: 132 mg/dL — ABNORMAL HIGH (ref 70–99)
Potassium: 4.8 mmol/L (ref 3.5–5.1)
Sodium: 137 mmol/L (ref 135–145)
Total Bilirubin: 0.9 mg/dL (ref 0.3–1.2)
Total Protein: 6.5 g/dL (ref 6.5–8.1)

## 2019-07-19 LAB — FERRITIN: Ferritin: 805 ng/mL — ABNORMAL HIGH (ref 24–336)

## 2019-07-19 LAB — MAGNESIUM: Magnesium: 2.2 mg/dL (ref 1.7–2.4)

## 2019-07-19 LAB — D-DIMER, QUANTITATIVE: D-Dimer, Quant: 5.77 ug/mL-FEU — ABNORMAL HIGH (ref 0.00–0.50)

## 2019-07-19 LAB — C-REACTIVE PROTEIN: CRP: 3.9 mg/dL — ABNORMAL HIGH (ref <1.0)

## 2019-07-19 MED ORDER — GUAIFENESIN ER 600 MG PO TB12
600.0000 mg | ORAL_TABLET | Freq: Two times a day (BID) | ORAL | Status: DC
  Administered 2019-07-19 – 2019-07-21 (×5): 600 mg via ORAL
  Filled 2019-07-19 (×5): qty 1

## 2019-07-19 NOTE — Evaluation (Addendum)
Physical Therapy Evaluation Patient Details Name: XAN JASSO MRN: [id number] DOB: [birth year] Today's Date: 07/19/2019   History of Present Illness  58 year old with a history of hepatitis C, cholelithiasis, cirrhosis of the liver, and COPD who presented to [facility] long ED with a 1 week history of fever, shortness of breath, nonproductive cough, nonbloody watery diarrhea, and intermittent nausea.  He was found to have an O2 sat of 85% on room air.  He was confirmed to be Covid positive.  He was initially admitted to [facility] but eventually was able to be transferred to [facility] once a bed became available.  Clinical Impression   The patient ambulated x 200' on RA with SPO2 > 88%, mostly >92%. Left in chair on RA at 95%. RN aware and  Patient. Pt admitted with above diagnosis.  Pt currently with functional limitations due to the deficits listed below (see PT Problem List). Pt will benefit from skilled PT to increase their independence and safety with mobility to allow discharge to the venue listed below.       Follow Up Recommendations No PT follow up    Equipment Recommendations  None recommended by PT    Recommendations for Other Services       Precautions / Restrictions Precautions Precautions: None Restrictions Weight Bearing Restrictions: No      Mobility  Bed Mobility Overal bed mobility: Needs Assistance Bed Mobility: Supine to Sit     Supine to sit: Supervision;HOB elevated     General bed mobility comments: oob  Transfers Overall transfer level: Needs assistance Equipment used: None Transfers: Sit to/from Stand Sit to Stand: Supervision         General transfer comment: Noted 0 instances of LOB  Ambulation/Gait Ambulation/Gait assistance: Supervision Gait Distance (Feet): 200 Feet Assistive device: None Gait Pattern/deviations: WFL(Within Functional Limits)     General Gait Details: slightly decreased speed  Stairs             Wheelchair Mobility    Modified Rankin (Stroke Patients Only)       Balance Overall balance assessment: No apparent balance deficits (not formally assessed)   Sitting balance-Leahy Scale: Good       Standing balance-Leahy Scale: Fair                               Pertinent Vitals/Pain Pain Assessment: No/denies pain    Home Living Family/patient expects to be discharged to:: Other (Comment)(Boarding house)                 Additional Comments: Pt has a tub/shower with no DME. lives in boarding house    Prior Function Level of Independence: Independent         Comments: Independent in ADLs, IADLs, and mobility. Pt does not ambulate with an assistive device. Pt reports that friends and family can assist him at home. Pt works and still drives.     Hand Dominance   Dominant Hand: Left    Extremity/Trunk Assessment   Upper Extremity Assessment Upper Extremity Assessment: Overall WFL for tasks assessed    Lower Extremity Assessment Lower Extremity Assessment: Overall WFL for tasks assessed    Cervical / Trunk Assessment Cervical / Trunk Assessment: Normal  Communication   Communication: No difficulties  Cognition Arousal/Alertness: Awake/alert Behavior During Therapy: WFL for tasks assessed/performed Overall Cognitive Status: Within Functional Limits for tasks assessed  General Comments: Pt reports min anxiety during activity , felt he needed O2, did not place on  O2 for avtivity, patient was pleased after knowing      General Comments General comments (skin integrity, edema, etc.): Pt does not utilize O2 at home and is currently on 1.5L via [place]. Ed pt on relaxation techniques to address anxiety. Ed pt on energy conservation strategies and activity modifications for ADLs. Ed pt on DME options for showering task to increase safety.      Exercises Other Exercises Other Exercises: Flutter valve  x 10 Other Exercises: Incentive spirometer x 10 Other Exercises: Pursed lip breathing x 10 with handout provided   Assessment/Plan    PT Assessment Patient needs continued PT services  PT Problem List Decreased activity tolerance;Cardiopulmonary status limiting activity       PT Treatment Interventions Gait training;Therapeutic exercise    PT Goals (Current goals can be found in the Care Plan section)  Acute Rehab PT Goals Patient Stated Goal: To be able to breath PT Goal Formulation: With patient Time For Goal Achievement: 08/02/19 Potential to Achieve Goals: Good    Frequency Min 3X/week   Barriers to discharge        Co-evaluation               AM-PAC PT "6 Clicks" Mobility  Outcome Measure Help needed turning from your back to your side while in a flat bed without using bedrails?: None Help needed moving from lying on your back to sitting on the side of a flat bed without using bedrails?: None Help needed moving to and from a bed to a chair (including a wheelchair)?: None Help needed standing up from a chair using your arms (e.g., wheelchair or bedside chair)?: None Help needed to walk in hospital room?: None Help needed climbing 3-5 steps with a railing? : A Little 6 Click Score: 23    End of Session   Activity Tolerance: Patient tolerated treatment well Patient left: in chair;with call bell/phone within reach Nurse Communication: Mobility status PT Visit Diagnosis: Difficulty in walking, not elsewhere classified (R26.2)    Time:  [id number] "     Charges: low Eval, 1 gait             [place]  Office [phone]   Claretha Cooper 07/19/2019, 5:11 PM

## 2019-07-19 NOTE — Plan of Care (Signed)
Pt slept well during the night. No complaints of pain verbalized. Alert and oriented. Vitals stable on RA, but prefers to keep 2L O2 via [place] for now due to dyspnea. Minimal dyspnea on exertion and productive cough noted. Independent with ADLs.  IV  SL. Incentive spirometry exercises started. Unable to tolerate prone positioning  overnight. No other issues, will monitor.   Problem: Education: Goal: Knowledge of risk factors and measures for prevention of condition will improve Outcome: Progressing   Problem: Coping: Goal: Psychosocial and spiritual needs will be supported Outcome: Progressing   Problem: Respiratory: Goal: Will maintain a patent airway Outcome: Progressing Goal: Complications related to the disease process, condition or treatment will be avoided or minimized Outcome: Progressing

## 2019-07-19 NOTE — Progress Notes (Addendum)
Dylan [name]  [id number] DOB: 07/25/1961 DOA: 07/16/2019 PCP: Patient, No Pcp Per    Brief Narrative:  58 year old with a history of hepatitis C, cholelithiasis, cirrhosis of the liver, and COPD who presented to [facility] long ED with a 1 week history of fever, shortness of breath, nonproductive cough, nonbloody watery diarrhea, and intermittent nausea.  He was found to have an O2 sat of 85% on room air.  He was confirmed to be Covid positive.  He was initially admitted to [facility] but eventually was able to be transferred to [facility] once a bed became available.  Significant Events: 11/17 admit via [facility] ED 11/18 transferred to [facility] 11/19 venous duplex bilateral lower extremities - no DVT B  COVID-19 specific Treatment: Decadron 11/17 > Remdesivir 11/17 > Convalescent plasma -refused  Subjective: Oxygen saturations appear relatively stable on 2 L nasal cannula at present.  Inflammatory markers appear to be trending down.  The patient states he feels very weak in general and is having difficulty with hectic coughing and thick mucus.  He reports a poor appetite.  He denies chest pain nausea vomiting or diarrhea.  Assessment & Plan:  Covid pneumonia -acute hypoxic respiratory failure CT chest confirmed multifocal patchy pulmonary infiltrates -continue Decadron and remdesivir -patient refused convalescent plasma -monitor closely -appears to have leveled off for now  Recent Labs  Lab 07/16/19 1455 07/16/19 1755 07/17/19 0416 07/18/19 0344 07/19/19 0215  DDIMER  --  3.10* 7.36* 12.48* 5.77*  FERRITIN  --  1,857* 1,588* 1,062* 805*  CRP  --  14.8* 12.4* 7.4* 3.9*  ALT 2*  --  37 74 34  PROCALCITON  --   --  0.19  --   --    COPD Well compensated at this time  Cirrhosis of the Liver - Hepatitis C By history it seems the patient was likely treated with interferon in the distant past -appears stable at this time  Transaminitis Mild but will definitely need to  be followed with history of cirrhosis -likely related to Covid itself versus remdesivir  DVT prophylaxis: Lovenox Code Status: FULL CODE Family Communication:  Disposition Plan: med/surg   Consultants:  none  Antimicrobials:  None  Objective: Blood pressure 133/82, pulse 79, temperature 98.4 F (36.9 C), temperature source Oral, resp. rate 17, height 5\' 7"  (1.702 m), weight 75.7 kg, SpO2 96 %.  Intake/Output Summary (Last 24 hours) at 07/19/2019 [id number] Last data filed at 07/19/2019 0400 Gross per 24 hour  Intake 2020 ml  Output 825 ml  Net 1195 ml   Filed Weights   07/18/19 0053  Weight: 75.7 kg    Examination: General: Resting comfortably in bed Lungs: Fine crackles bibasilar fields Cardiovascular: RRR without murmur Abdomen: NT/ND, soft, BS positive, no rebound Extremities: No significant cyanosis, clubbing, or edema bilateral lower extremities  CBC: Recent Labs  Lab 07/17/19 0416 07/18/19 0344 07/19/19 0215  WBC 4.1 7.1 7.0  NEUTROABS 3.5 6.3 6.3  HGB 15.2 13.8 13.4  HCT 46.1 41.4 40.9  MCV 94.3 94.1 94.0  PLT 187 214 [id number]   Basic Metabolic Panel: Recent Labs  Lab 07/17/19 0416 07/18/19 0344 07/19/19 0215  NA 134* 134* 137  K 4.6 3.9 4.8  CL 103 101 104  CO2 22 23 22   GLUCOSE 142* 140* 132*  BUN 19 23* 18  CREATININE 1.04 0.96 0.96  CALCIUM 8.1* 8.4* 8.4*  MG 2.5* 2.4 2.2   GFR: Estimated Creatinine Clearance: 77.5 mL/min (by C-G formula based  on SCr of 0.96 mg/dL).  Liver Function Tests: Recent Labs  Lab 07/16/19 1455 07/17/19 0416 07/18/19 0344 07/19/19 0215  AST 73* 58* 48* 53*  ALT 45* 37 33 34  ALKPHOS 29* 24* 23* 26*  BILITOT 1.8* 1.2 0.7 0.9  PROT 8.4* 6.8 6.8 6.5  ALBUMIN 3.2* 2.5* 2.7* 2.7*     Recent Results (from the past 240 hour(s))  SARS CORONAVIRUS 2 (TAT 6-24 HRS) Nasopharyngeal Nasopharyngeal Swab     Status: Abnormal   Collection Time: 07/16/19  3:12 PM   Specimen: Nasopharyngeal Swab  Result Value Ref Range  Status   SARS Coronavirus 2 POSITIVE (A) NEGATIVE Final    Comment: RESULT CALLED TO, READ BACK BY AND VERIFIED WITH: J FERRAINOLO,RN 2000 07/16/2019 D BRADLEY (NOTE) SARS-CoV-2 target nucleic acids are DETECTED. The SARS-CoV-2 RNA is generally detectable in upper and lower respiratory specimens during the acute phase of infection. Positive results are indicative of active infection with SARS-CoV-2. Clinical  correlation with patient history and other diagnostic information is necessary to determine patient infection status. Positive results do  not rule out bacterial infection or co-infection with other viruses. The expected result is Negative. Fact Sheet for Patients: SugarRoll.be Fact Sheet for Healthcare Providers: https://www.woods-mathews.com/ This test is not yet approved or cleared by the [place] FDA and  has been authorized for detection and/or diagnosis of SARS-CoV-2 by FDA under an Emergency Use Authorization (EUA). This EUA will remain  in effect (meaning this test can be used)  for the duration of the COVID-19 declaration under Section 564(b)(1) of the Act, 21 U.S.C. section 360bbb-3(b)(1), unless the authorization is terminated or revoked sooner. Performed at [facility], [place] [address], [place], [place] [address]   Blood Culture (routine x 2)     Status: None (Preliminary result)   Collection Time: 07/16/19  5:50 PM   Specimen: BLOOD  Result Value Ref Range Status   Specimen Description BLOOD RIGHT ANTECUBITAL  Final   Special Requests   Final    BOTTLES DRAWN AEROBIC AND ANAEROBIC Blood Culture adequate volume   Culture   Final    NO GROWTH 2 DAYS Performed at [facility], [place] [address], [place], [place] [address]    Report Status PENDING  Incomplete  Blood Culture (routine x 2)     Status: None (Preliminary result)   Collection Time: 07/16/19  5:55 PM   Specimen: BLOOD  Result Value Ref Range  Status   Specimen Description BLOOD LEFT ANTECUBITAL  Final   Special Requests   Final    BOTTLES DRAWN AEROBIC AND ANAEROBIC Blood Culture adequate volume   Culture   Final    NO GROWTH 2 DAYS Performed at [facility], [place] [address], [place], [place] [address]    Report Status PENDING  Incomplete     Scheduled Meds: . dexamethasone (DECADRON) injection  6 mg Intravenous Q24H  . enoxaparin (LOVENOX) injection  40 mg Subcutaneous Q12H  . folic acid  1 mg Oral Daily  . vitamin C  500 mg Oral Daily  . zinc sulfate  220 mg Oral Daily   Continuous Infusions: . remdesivir 100 mg in NS 250 mL Stopped (07/18/19 1810)     LOS: 3 days   Cherene Altes, MD [facility] Office  [phone] Pager - Text Page per Amion  If 7PM-7AM, please contact night-coverage per Amion 07/19/2019, 8:12 AM

## 2019-07-19 NOTE — Progress Notes (Signed)
Occupational Therapy Evaluation Patient Details Name: Dylan [name] MRN: [id number] DOB: May 23, 1961 Today's Date: 07/19/2019    History of Present Illness 58 year old with a history of hepatitis C, cholelithiasis, cirrhosis of the liver, and COPD who presented to [facility] long ED with a 1 week history of fever, shortness of breath, nonproductive cough, nonbloody watery diarrhea, and intermittent nausea.  He was found to have an O2 sat of 85% on room air.  He was confirmed to be Covid positive.  He was initially admitted to [facility] but eventually was able to be transferred to [facility] once a bed became available.   Clinical Impression   PTA pt was living at a boarding house, independent with ADLs, IADLs, and mobility. Pt did not previously use oxygen at home, but is currently on 1.5L via nasal cannula. Pt currently requires supervision for self-care and functional transfer tasks. Pt able to complete seated therapy tasks on room air with O2 SATs maintaining around 95%. Pt able to ambulate to/from bathroom x 2 on room air with O2 SATs decreasing to 88%. DOE 2/4. Following mod seated rest break pt's O2 SATs maintained at 88% with O2 donned. Pt able to reach 1243mL on incentive spirometer. Pt reported min to mod anxiety when SOB. Pt demonstrates decreased strength, endurance, balance, standing tolerance, and activity tolerance impacting ability to complete self-care and functional transfer tasks. Recommend skilled OT services to address above deficits in order to promote function and prevent further decline. No additional OT needs after discharge.     Follow Up Recommendations  No OT follow up    Equipment Recommendations  3 in 1 bedside commode    Recommendations for Other Services       Precautions / Restrictions Precautions Precautions: None Restrictions Weight Bearing Restrictions: No      Mobility Bed Mobility Overal bed mobility: Needs Assistance Bed Mobility: Supine to Sit      Supine to sit: Supervision;HOB elevated        Transfers Overall transfer level: Needs assistance Equipment used: None Transfers: Sit to/from Stand Sit to Stand: Supervision         General transfer comment: Noted 0 instances of LOB    Balance Overall balance assessment: Needs assistance   Sitting balance-Leahy Scale: Good       Standing balance-Leahy Scale: Fair                             ADL either performed or assessed with clinical judgement   ADL Overall ADL's : Needs assistance/impaired Eating/Feeding: Independent   Grooming: Supervision/safety   Upper Body Bathing: Supervision/ safety;Sitting   Lower Body Bathing: Supervison/ safety;Sitting/lateral leans;Sit to/from stand   Upper Body Dressing : Supervision/safety;Sitting   Lower Body Dressing: Supervision/safety;Sitting/lateral leans;Sit to/from stand   Toilet Transfer: Supervision/safety;Ambulation   Toileting- Clothing Manipulation and Hygiene: Supervision/safety;Sit to/from stand;Sitting/lateral lean       Functional mobility during ADLs: Supervision/safety       Vision         Perception     Praxis      Pertinent Vitals/Pain Pain Assessment: No/denies pain     Hand Dominance Left   Extremity/Trunk Assessment Upper Extremity Assessment Upper Extremity Assessment: Overall WFL for tasks assessed   Lower Extremity Assessment Lower Extremity Assessment: Defer to PT evaluation       Communication Communication Communication: No difficulties   Cognition Arousal/Alertness: Awake/alert Behavior During Therapy: WFL for tasks assessed/performed Overall  Cognitive Status: Within Functional Limits for tasks assessed                                 General Comments: Pt reports min anxiety during activity due to SOB   General Comments  Pt does not utilize O2 at home and is currently on 1.5L via . Ed pt on relaxation techniques to address anxiety. Ed pt  on energy conservation strategies and activity modifications for ADLs. Ed pt on DME options for showering task to increase safety.      Exercises Exercises: Other exercises Other Exercises Other Exercises: Flutter valve x 10 Other Exercises: Incentive spirometer x 10 Other Exercises: Pursed lip breathing x 10 with handout provided   Shoulder Instructions      Home Living Family/patient expects to be discharged to:: Other (Comment)(Boarding house)                                 Additional Comments: Pt has a tub/shower with no DME.      Prior Functioning/Environment Level of Independence: Independent        Comments: Independent in ADLs, IADLs, and mobility. Pt does not ambulate with an assistive device. Pt reports that friends and family can assist him at home. Pt works and still drives.        OT Problem List: Decreased strength;Decreased activity tolerance;Impaired balance (sitting and/or standing);Decreased knowledge of use of DME or AE;Cardiopulmonary status limiting activity      OT Treatment/Interventions: Self-care/ADL training;Therapeutic exercise;Neuromuscular education;Energy conservation;DME and/or AE instruction;Therapeutic activities;Patient/family education;Balance training    OT Goals(Current goals can be found in the care plan section) Acute Rehab OT Goals Patient Stated Goal: To be able to breath OT Goal Formulation: With patient Time For Goal Achievement: 08/02/19 Potential to Achieve Goals: Good ADL Goals Additional ADL Goal #1: Pt will recall and verbalize 3 energy conservation techniques. Additional ADL Goal #2: Pt will recall and demonstrate breathing exercises with 0 verbal cues.  OT Frequency: Min 3X/week   Barriers to D/C:            Co-evaluation              AM-PAC OT "6 Clicks" Daily Activity     Outcome Measure Help from another person eating meals?: None Help from another person taking care of personal grooming?: A  Little Help from another person toileting, which includes using toliet, bedpan, or urinal?: A Little Help from another person bathing (including washing, rinsing, drying)?: A Little Help from another person to put on and taking off regular upper body clothing?: A Little Help from another person to put on and taking off regular lower body clothing?: A Little 6 Click Score: 19   End of Session Nurse Communication: Mobility status  Activity Tolerance: Patient limited by fatigue(and SOB) Patient left: in chair;with call bell/phone within reach  OT Visit Diagnosis: Unsteadiness on feet (R26.81);Other abnormalities of gait and mobility (R26.89);Muscle weakness (generalized) (M62.81)                Time: [id number] OT Time Calculation (min): 57 min Charges:  OT General Charges $OT Visit: 1 Visit OT Evaluation $OT Eval Moderate Complexity: 1 Mod OT Treatments $Self Care/Home Management : 8-22 mins $Therapeutic Exercise: 23-37 mins  Mauri Brooklyn OTR/L [phone]   Mauri Brooklyn 07/19/2019, 4:31 PM

## 2019-07-19 NOTE — Progress Notes (Signed)
PT Cancellation Note  Patient Details Name: AYAANSH JURAS MRN: [id number] DOB: 10/07/1960   Cancelled Treatment:    Reason Eval/Treat Not Completed: Patient declined, states that he is tired and wants to rest. Says he will walk in PM. Encouraged Pt. To mobilize. Will check back another time.   Claretha Cooper 07/19/2019, 1:47 PM  [place]  Office [phone]

## 2019-07-20 ENCOUNTER — Encounter (HOSPITAL_COMMUNITY): Payer: Self-pay

## 2019-07-20 LAB — CBC WITH DIFFERENTIAL/PLATELET
Abs Immature Granulocytes: 0.06 10*3/uL (ref 0.00–0.07)
Basophils Absolute: 0 10*3/uL (ref 0.0–0.1)
Basophils Relative: 0 %
Eosinophils Absolute: 0 10*3/uL (ref 0.0–0.5)
Eosinophils Relative: 0 %
HCT: 41.4 % (ref 39.0–52.0)
Hemoglobin: 13.5 g/dL (ref 13.0–17.0)
Immature Granulocytes: 1 %
Lymphocytes Relative: 7 %
Lymphs Abs: 0.5 10*3/uL — ABNORMAL LOW (ref 0.7–4.0)
MCH: 30.8 pg (ref 26.0–34.0)
MCHC: 32.6 g/dL (ref 30.0–36.0)
MCV: 94.3 fL (ref 80.0–100.0)
Monocytes Absolute: 0.6 10*3/uL (ref 0.1–1.0)
Monocytes Relative: 8 %
Neutro Abs: 5.7 10*3/uL (ref 1.7–7.7)
Neutrophils Relative %: 84 %
Platelets: 262 10*3/uL (ref 150–400)
RBC: 4.39 MIL/uL (ref 4.22–5.81)
RDW: 13.3 % (ref 11.5–15.5)
WBC: 6.8 10*3/uL (ref 4.0–10.5)
nRBC: 0 % (ref 0.0–0.2)

## 2019-07-20 LAB — COMPREHENSIVE METABOLIC PANEL
ALT: 34 U/L (ref 0–44)
AST: 47 U/L — ABNORMAL HIGH (ref 15–41)
Albumin: 2.8 g/dL — ABNORMAL LOW (ref 3.5–5.0)
Alkaline Phosphatase: 32 U/L — ABNORMAL LOW (ref 38–126)
Anion gap: 11 (ref 5–15)
BUN: 21 mg/dL — ABNORMAL HIGH (ref 6–20)
CO2: 24 mmol/L (ref 22–32)
Calcium: 8.7 mg/dL — ABNORMAL LOW (ref 8.9–10.3)
Chloride: 103 mmol/L (ref 98–111)
Creatinine, Ser: 0.94 mg/dL (ref 0.61–1.24)
GFR calc Af Amer: >60 mL/min (ref >60)
GFR calc non Af Amer: >60 mL/min (ref >60)
Glucose, Bld: 145 mg/dL — ABNORMAL HIGH (ref 70–99)
Potassium: 4.2 mmol/L (ref 3.5–5.1)
Sodium: 138 mmol/L (ref 135–145)
Total Bilirubin: 0.7 mg/dL (ref 0.3–1.2)
Total Protein: 6.6 g/dL (ref 6.5–8.1)

## 2019-07-20 LAB — C-REACTIVE PROTEIN: CRP: 3.2 mg/dL — ABNORMAL HIGH (ref <1.0)

## 2019-07-20 LAB — D-DIMER, QUANTITATIVE: D-Dimer, Quant: 2.96 ug/mL-FEU — ABNORMAL HIGH (ref 0.00–0.50)

## 2019-07-20 LAB — MAGNESIUM: Magnesium: 2.1 mg/dL (ref 1.7–2.4)

## 2019-07-20 LAB — FERRITIN: Ferritin: 718 ng/mL — ABNORMAL HIGH (ref 24–336)

## 2019-07-20 MED ORDER — DEXAMETHASONE 6 MG PO TABS
6.0000 mg | ORAL_TABLET | Freq: Every day | ORAL | Status: DC
  Administered 2019-07-21: 6 mg via ORAL
  Filled 2019-07-20: qty 1

## 2019-07-20 NOTE — Plan of Care (Signed)
  Problem: Education: Goal: Knowledge of risk factors and measures for prevention of condition will improve Outcome: Progressing   Problem: Coping: Goal: Psychosocial and spiritual needs will be supported Outcome: Progressing   Problem: Respiratory: Goal: Will maintain a patent airway Outcome: Progressing Goal: Complications related to the disease process, condition or treatment will be avoided or minimized Outcome: Progressing   

## 2019-07-20 NOTE — Progress Notes (Signed)
Offered to give primary contact an update via phone. Patient declined at this time. States he has been in constant contact with his family/friends. He is expecting to D/C tomorrow and may need a cab- will pass on in the morning for case management/social work. Also pt asked me to cut 3 inches off the bottom of his t-shirt because it was soiled and he wanted to wear it tomorrow to be d/c'ed in. I accommodated that request and patient was pleased with the result. Will continue to monitor throughout this shift.

## 2019-07-20 NOTE — Progress Notes (Signed)
Rn left a message for [employer].

## 2019-07-20 NOTE — Progress Notes (Signed)
Pt. Pulled IV out while taking a shower. MD is OK with leaving IV out. Pt. Will be dc tomorrow

## 2019-07-20 NOTE — Progress Notes (Signed)
Dylan [name]  [id number] DOB: [birth year] DOA: 07/16/2019 PCP: Patient, No Pcp Per    Brief Narrative:  58 year old with a history of hepatitis C, cholelithiasis, cirrhosis of the liver, and COPD who presented to [facility] long ED with a 1 week history of fever, shortness of breath, nonproductive cough, nonbloody watery diarrhea, and intermittent nausea.  He was found to have an O2 sat of 85% on room air.  He was confirmed to be Covid positive.  He was initially admitted to [facility] but eventually was able to be transferred to [facility] once a bed became available.  Significant Events: 11/17 admit via [facility] ED 11/18 transferred to [facility] 11/19 venous duplex bilateral lower extremities - no DVT B  COVID-19 specific Treatment: Decadron 11/17 > Remdesivir 11/17 > Convalescent plasma -refused  Subjective: The patient is rapidly improving.  He has been successfully weaned to room air.  He is resting comfortably at this time.  He is willing to get up and get moving around the room.  He denies chest pain nausea vomiting or abdominal pain.  Assessment & Plan:  Covid pneumonia -acute hypoxic respiratory failure CT chest confirmed multifocal patchy pulmonary infiltrates -continue Decadron and remdesivir -patient refused convalescent plasma -monitor closely -appears to have leveled off for now -check saturations with ambulation  Recent Labs  Lab 07/16/19 1455 07/16/19 1755 07/17/19 0416 07/18/19 0344 07/19/19 0215 07/20/19 0105  DDIMER  --  3.10* 7.36* 12.48* 5.77* 2.96*  FERRITIN  --  1,857* 1,588* 1,062* 805* 718*  CRP  --  14.8* 12.4* 7.4* 3.9* 3.2*  ALT 45*  --  26 33 48 34  PROCALCITON  --   --  0.19  --   --   --    COPD Well compensated at this time with no acute flare at this time  Cirrhosis of the Liver - Hepatitis C By history it seems the patient was likely treated with interferon in the distant past -appears stable at this time  Transaminitis Mild but  will definitely need to be followed with history of cirrhosis -likely related to Covid itself versus remdesivir  DVT prophylaxis: Lovenox Code Status: FULL CODE Family Communication:  Disposition Plan: med/surg   Consultants:  none  Antimicrobials:  None  Objective: Blood pressure (!) 142/91, pulse 83, temperature 98.2 F (36.8 C), temperature source Oral, resp. rate 18, height 5\' 7"  (1.702 m), weight 75.7 kg, SpO2 95 %.  Intake/Output Summary (Last 24 hours) at 07/20/2019 0826 Last data filed at 07/20/2019 0400 Gross per 24 hour  Intake 650 ml  Output 725 ml  Net -75 ml   Filed Weights   07/18/19 0053  Weight: 75.7 kg    Examination: General: Resting comfortably in bed -alert and oriented Lungs: Fine crackles bibasilar fields without wheezing Cardiovascular: RRR Abdomen: NT/ND, soft, BS positive, no rebound Extremities: No C/C/E B LE  CBC: Recent Labs  Lab 07/18/19 0344 07/19/19 0215 07/20/19 0105  WBC 7.1 7.0 6.8  NEUTROABS 6.3 6.3 5.7  HGB 13.8 13.4 13.5  HCT 41.4 40.9 41.4  MCV 94.1 94.0 94.3  PLT 214 238 [id number]   Basic Metabolic Panel: Recent Labs  Lab 07/18/19 0344 07/19/19 0215 07/20/19 0105  NA 134* 137 138  K 3.9 4.8 4.2  CL 101 104 103  CO2 23 22 24   GLUCOSE 140* 132* 145*  BUN 23* 18 21*  CREATININE 0.96 0.96 0.94  CALCIUM 8.4* 8.4* 8.7*  MG 2.4 2.2 2.1   GFR:  Estimated Creatinine Clearance: 79.1 mL/min (by C-G formula based on SCr of 0.94 mg/dL).  Liver Function Tests: Recent Labs  Lab 07/17/19 0416 07/18/19 0344 07/19/19 0215 07/20/19 0105  AST 58* 48* 53* 47*  ALT 37 33 34 34  ALKPHOS 24* 23* 26* 32*  BILITOT 1.2 0.7 0.9 0.7  PROT 6.8 6.8 6.5 6.6  ALBUMIN 2.5* 2.7* 2.7* 2.8*     Recent Results (from the past 240 hour(s))  SARS CORONAVIRUS 2 (TAT 6-24 HRS) Nasopharyngeal Nasopharyngeal Swab     Status: Abnormal   Collection Time: 07/16/19  3:12 PM   Specimen: Nasopharyngeal Swab  Result Value Ref Range Status   SARS  Coronavirus 2 POSITIVE (A) NEGATIVE Final    Comment: RESULT CALLED TO, READ BACK BY AND VERIFIED WITH: J FERRAINOLO,RN 2000 07/16/2019 D BRADLEY (NOTE) SARS-CoV-2 target nucleic acids are DETECTED. The SARS-CoV-2 RNA is generally detectable in upper and lower respiratory specimens during the acute phase of infection. Positive results are indicative of active infection with SARS-CoV-2. Clinical  correlation with patient history and other diagnostic information is necessary to determine patient infection status. Positive results do  not rule out bacterial infection or co-infection with other viruses. The expected result is Negative. Fact Sheet for Patients: SugarRoll.be Fact Sheet for Healthcare Providers: https://www.woods-mathews.com/ This test is not yet approved or cleared by the [place] FDA and  has been authorized for detection and/or diagnosis of SARS-CoV-2 by FDA under an Emergency Use Authorization (EUA). This EUA will remain  in effect (meaning this test can be used)  for the duration of the COVID-19 declaration under Section 564(b)(1) of the Act, 21 U.S.C. section 360bbb-3(b)(1), unless the authorization is terminated or revoked sooner. Performed at [facility], [place] [address] [place], [place] [address]   Blood Culture (routine x 2)     Status: None (Preliminary result)   Collection Time: 07/16/19  5:50 PM   Specimen: BLOOD  Result Value Ref Range Status   Specimen Description BLOOD RIGHT ANTECUBITAL  Final   Special Requests   Final    BOTTLES DRAWN AEROBIC AND ANAEROBIC Blood Culture adequate volume   Culture   Final    NO GROWTH 3 DAYS Performed at [facility], [place] [address], [place], [place] [address]    Report Status PENDING  Incomplete  Blood Culture (routine x 2)     Status: None (Preliminary result)   Collection Time: 07/16/19  5:55 PM   Specimen: BLOOD  Result Value Ref Range Status   Specimen  Description BLOOD LEFT ANTECUBITAL  Final   Special Requests   Final    BOTTLES DRAWN AEROBIC AND ANAEROBIC Blood Culture adequate volume   Culture   Final    NO GROWTH 3 DAYS Performed at [facility], [place] [address], [place], [place] [address]    Report Status PENDING  Incomplete     Scheduled Meds: . dexamethasone (DECADRON) injection  6 mg Intravenous Q24H  . enoxaparin (LOVENOX) injection  40 mg Subcutaneous Q12H  . folic acid  1 mg Oral Daily  . guaiFENesin  600 mg Oral BID  . vitamin C  500 mg Oral Daily  . zinc sulfate  220 mg Oral Daily   Continuous Infusions: . remdesivir 100 mg in NS 250 mL 100 mg (07/19/19 0951)     LOS: 4 days   Cherene Altes, MD [facility] Office  [phone] Pager - Text Page per Shea Evans  If 7PM-7AM, please  contact night-coverage per Amion 07/20/2019, 8:26 AM

## 2019-07-21 DIAGNOSIS — J1289 Other viral pneumonia: Secondary | ICD-10-CM

## 2019-07-21 DIAGNOSIS — U071 COVID-19: Principal | ICD-10-CM

## 2019-07-21 LAB — CBC WITH DIFFERENTIAL/PLATELET
Abs Immature Granulocytes: 0.17 10*3/uL — ABNORMAL HIGH (ref 0.00–0.07)
Basophils Absolute: 0 10*3/uL (ref 0.0–0.1)
Basophils Relative: 0 %
Eosinophils Absolute: 0.1 10*3/uL (ref 0.0–0.5)
Eosinophils Relative: 1 %
HCT: 42.4 % (ref 39.0–52.0)
Hemoglobin: 13.9 g/dL (ref 13.0–17.0)
Immature Granulocytes: 2 %
Lymphocytes Relative: 17 %
Lymphs Abs: 1.3 10*3/uL (ref 0.7–4.0)
MCH: 31.3 pg (ref 26.0–34.0)
MCHC: 32.8 g/dL (ref 30.0–36.0)
MCV: 95.5 fL (ref 80.0–100.0)
Monocytes Absolute: 0.9 10*3/uL (ref 0.1–1.0)
Monocytes Relative: 12 %
Neutro Abs: 5.3 10*3/uL (ref 1.7–7.7)
Neutrophils Relative %: 68 %
Platelets: 300 10*3/uL (ref 150–400)
RBC: 4.44 MIL/uL (ref 4.22–5.81)
RDW: 13.5 % (ref 11.5–15.5)
WBC: 7.7 10*3/uL (ref 4.0–10.5)
nRBC: 1.3 % — ABNORMAL HIGH (ref 0.0–0.2)

## 2019-07-21 LAB — COMPREHENSIVE METABOLIC PANEL
ALT: 33 U/L (ref 0–44)
AST: 46 U/L — ABNORMAL HIGH (ref 15–41)
Albumin: 2.9 g/dL — ABNORMAL LOW (ref 3.5–5.0)
Alkaline Phosphatase: 27 U/L — ABNORMAL LOW (ref 38–126)
Anion gap: 10 (ref 5–15)
BUN: 16 mg/dL (ref 6–20)
CO2: 24 mmol/L (ref 22–32)
Calcium: 8.7 mg/dL — ABNORMAL LOW (ref 8.9–10.3)
Chloride: 104 mmol/L (ref 98–111)
Creatinine, Ser: 1.08 mg/dL (ref 0.61–1.24)
GFR calc Af Amer: >60 mL/min (ref >60)
GFR calc non Af Amer: >60 mL/min (ref >60)
Glucose, Bld: 97 mg/dL (ref 70–99)
Potassium: 3.9 mmol/L (ref 3.5–5.1)
Sodium: 138 mmol/L (ref 135–145)
Total Bilirubin: 0.8 mg/dL (ref 0.3–1.2)
Total Protein: 6.9 g/dL (ref 6.5–8.1)

## 2019-07-21 LAB — CULTURE, BLOOD (ROUTINE X 2)
Culture: NO GROWTH
Culture: NO GROWTH
Special Requests: ADEQUATE
Special Requests: ADEQUATE

## 2019-07-21 LAB — C-REACTIVE PROTEIN: CRP: 1.8 mg/dL — ABNORMAL HIGH (ref <1.0)

## 2019-07-21 LAB — D-DIMER, QUANTITATIVE: D-Dimer, Quant: 2.42 ug/mL-FEU — ABNORMAL HIGH (ref 0.00–0.50)

## 2019-07-21 LAB — FERRITIN: Ferritin: 686 ng/mL — ABNORMAL HIGH (ref 24–336)

## 2019-07-21 MED ORDER — GUAIFENESIN ER 600 MG PO TB12: 600.0000 mg | ORAL_TABLET | Freq: Two times a day (BID) | ORAL | 0 refills | Status: DC

## 2019-07-21 MED ORDER — DEXAMETHASONE 6 MG PO TABS: 6.0000 mg | ORAL_TABLET | Freq: Every day | ORAL | 0 refills | Status: AC

## 2019-07-21 MED ORDER — DEXAMETHASONE 6 MG PO TABS: 6.0000 mg | ORAL_TABLET | Freq: Every day | ORAL | 0 refills | Status: DC

## 2019-07-21 NOTE — Clinical Social Work Note (Signed)
Pt 02 is set up through Augusto Gamble has been arranged.  [place], [place]

## 2019-07-21 NOTE — Progress Notes (Signed)
   07/21/19   To Whom it may concern,  Dylan [name] was admitted to [facility] on 07/16/2019  and remained under my care in the hospital through 07/21/2019.  He has been advised that he should not return to work until 08/06/2019, at which time he will no longer need to be on quarantine.   Sincerely,  Cherene Altes, MD [facility]  [phone]

## 2019-07-21 NOTE — Progress Notes (Addendum)
Pt updated wife on phone. Reviewed AVS with pt. 1 printed RX with AVS. Reviewed how to use home 02 portable take. All questions answered. Pulse ox giving to patient. Pt is not happy with the size of the 02 portable tank. States portable tank too big to carry up and down stairs. And he does not want long tubing in his house. Made CSW aware of his concerns.

## 2019-07-21 NOTE — Discharge Summary (Signed)
DISCHARGE SUMMARY  Dylan [name]  MR#: [id number]  DOB:April 05, 1961  Date of Admission: 07/16/2019 Date of Discharge: 07/21/2019  Attending Physician:Diara Chaudhari Hennie Duos, MD  Patient's QP:[id number], No Pcp Per  Consults:  None   Disposition: D/C home   Follow-up Appts: Follow-up Information    Case Management will assist you in identifying a primary care provider. Follow up.   Why: You will need to be seen in follow-up in 5-7 days.           Tests Needing Follow-up: -determine if supplemental O2 can be discontinued (was requiring 2L [place] on exertion at time of d/c to keep sats > 87%  Date of positive test: 07/16/2019  Date quarantine ends: 08/06/2019  COVID-19 specific Treatment: Decadron 11/17 > 11/26 Remdesivir 11/17 > 11/21  Discharge Diagnoses: Covid pneumonia Acute hypoxic respiratory failure COPD Cirrhosis of the Liver Hepatitis C Acute Transaminitis  Initial presentation: 58 year old with a history of hepatitis C, cholelithiasis, cirrhosis of the liver, and COPD who presented to [facility] ED with a 1 week history of fever, shortness of breath, nonproductive cough, nonbloody watery diarrhea, and intermittent nausea.  He was found to have an O2 sat of 85% on room air.  He was confirmed to be COVID positive.  He was initially admitted to [facility] but eventually was able to be transferred to [facility] once a bed became available.  Hospital Course:  Covid pneumonia -acute hypoxic respiratory failure CT chest confirmed multifocal patchy pulmonary infiltrates - continue Decadron to completed a 10 day course - completed a 5 day course of remdesivir - refused convalescent plasma - sats stable on RA at time of d/c, but desaturates to 85% on RA when ambulating - pt to be sent home on 2L [place] support   COPD Well compensated during this admission  Cirrhosis of the Liver - Hepatitis C By history it seems the patient was likely treated with interferon in the  distant past -appears stable at this time -no acute complications during this hospital stay  Transaminitis likely related to Covid itself versus remdesivir -remained stable throughout this hospital stay and very minimal elevation even at peak  Allergies as of 07/21/2019      Reactions   Other Nausea And Vomiting   Narcotics- pt states he does not like to take narcotics d/t makes him sick      Medication List    TAKE these medications   Alka-Seltzer Plus Cold 2-7.8-325 MG Tbef Generic drug: Chlorphen-Phenyleph-ASA Take 2 tablets by mouth 2 (two) times daily as needed (cold symptoms).   dexamethasone 6 MG tablet Commonly known as: DECADRON Take 1 tablet (6 mg total) by mouth daily for 4 days. Start taking on: July 22, 2019   guaiFENesin 600 MG 12 hr tablet Commonly known as: MUCINEX Take 1 tablet (600 mg total) by mouth 2 (two) times daily.            Durable Medical Equipment  (From admission, onward)         Start     Ordered   07/21/19 1044  For home use only DME oxygen  Once    Question Answer Comment  Length of Need 6 Months   Mode or (Route) Nasal cannula   Liters per Minute 2   Frequency Continuous (stationary and portable oxygen unit needed)   Oxygen conserving device Yes   Oxygen delivery system Gas      07/21/19 1044  Day of Discharge BP 122/87 (BP Location: Left Arm)   Pulse 83   Temp 98 F (36.7 C) (Oral)   Resp 17   Ht 5\' 7"  (1.702 m)   Wt 75.7 kg   SpO2 91%   BMI 26.14 kg/m   Physical Exam: General: No acute respiratory distress Lungs: Fine scattered crackles bilateral bases Cardiovascular: Regular rate and rhythm without murmur gallop or rub normal S1 and S2 Abdomen: Nontender, nondistended, soft, bowel sounds positive, no rebound, no ascites, no appreciable mass Extremities: No significant cyanosis, clubbing, or edema bilateral lower extremities  Basic Metabolic Panel: Recent Labs  Lab 07/16/19 1455 07/17/19 0416  07/18/19 0344 07/19/19 0215 07/20/19 0105 07/21/19 0220  NA  --  134* 134* 137 138 138  K  --  4.6 3.9 4.8 4.2 3.9  CL  --  103 101 104 103 104  CO2  --  22 23 22 24 24   GLUCOSE  --  142* 140* 132* 145* 97  BUN  --  19 23* 18 21* 16  CREATININE  --  1.04 0.96 0.96 0.94 1.08  CALCIUM  --  8.1* 8.4* 8.4* 8.7* 8.7*  MG 2.6* 2.5* 2.4 2.2 2.1  --     Liver Function Tests: Recent Labs  Lab 07/17/19 0416 07/18/19 0344 07/19/19 0215 07/20/19 0105 07/21/19 0220  AST 58* 48* 53* 47* 46*  ALT 37 33 34 34 33  ALKPHOS 24* 23* 26* 32* 27*  BILITOT 1.2 0.7 0.9 0.7 0.8  PROT 6.8 6.8 6.5 6.6 6.9  ALBUMIN 2.5* 2.7* 2.7* 2.8* 2.9*    CBC: Recent Labs  Lab 07/17/19 0416 07/18/19 0344 07/19/19 0215 07/20/19 0105 07/21/19 0220  WBC 4.1 7.1 7.0 6.8 7.7  NEUTROABS 3.5 6.3 6.3 5.7 5.3  HGB 15.2 13.8 13.4 13.5 13.9  HCT 46.1 41.4 40.9 41.4 42.4  MCV 94.3 94.1 94.0 94.3 95.5  PLT 187 214 238 262 300    Recent Results (from the past 240 hour(s))  SARS CORONAVIRUS 2 (TAT 6-24 HRS) Nasopharyngeal Nasopharyngeal Swab     Status: Abnormal   Collection Time: 07/16/19  3:12 PM   Specimen: Nasopharyngeal Swab  Result Value Ref Range Status   SARS Coronavirus 2 POSITIVE (A) NEGATIVE Final    Comment: RESULT CALLED TO, READ BACK BY AND VERIFIED WITH: J FERRAINOLO,RN 2000 07/16/2019 D BRADLEY (NOTE) SARS-CoV-2 target nucleic acids are DETECTED. The SARS-CoV-2 RNA is generally detectable in upper and lower respiratory specimens during the acute phase of infection. Positive results are indicative of active infection with SARS-CoV-2. Clinical  correlation with patient history and other diagnostic information is necessary to determine patient infection status. Positive results do  not rule out bacterial infection or co-infection with other viruses. The expected result is Negative. Fact Sheet for Patients: SugarRoll.be Fact Sheet for Healthcare Providers:  https://www.woods-mathews.com/ This test is not yet approved or cleared by the [place] FDA and  has been authorized for detection and/or diagnosis of SARS-CoV-2 by FDA under an Emergency Use Authorization (EUA). This EUA will remain  in effect (meaning this test can be used)  for the duration of the COVID-19 declaration under Section 564(b)(1) of the Act, 21 U.S.C. section 360bbb-3(b)(1), unless the authorization is terminated or revoked sooner. Performed at [facility], [place] [address], [place], [place] [address]   Blood Culture (routine x 2)     Status: None   Collection Time: 07/16/19  5:50 PM   Specimen: BLOOD  Result Value Ref  Range Status   Specimen Description BLOOD RIGHT ANTECUBITAL  Final   Special Requests   Final    BOTTLES DRAWN AEROBIC AND ANAEROBIC Blood Culture adequate volume   Culture   Final    NO GROWTH 5 DAYS Performed at [facility], [address] [place], [place] [address]    Report Status 07/21/2019 FINAL  Final  Blood Culture (routine x 2)     Status: None   Collection Time: 07/16/19  5:55 PM   Specimen: BLOOD  Result Value Ref Range Status   Specimen Description BLOOD LEFT ANTECUBITAL  Final   Special Requests   Final    BOTTLES DRAWN AEROBIC AND ANAEROBIC Blood Culture adequate volume   Culture   Final    NO GROWTH 5 DAYS Performed at [facility], [place] [address], [place], [place] [address]    Report Status 07/21/2019 FINAL  Final     Time spent in discharge (includes decision making & examination of pt): 35 minutes  07/21/2019, 10:59 AM   Cherene Altes, MD [facility]  [phone]

## 2019-07-21 NOTE — Progress Notes (Signed)
Informed Dylan [name] pt qualified for home o2 and will need a ride home

## 2019-07-21 NOTE — Progress Notes (Signed)
SATURATION QUALIFICATIONS: (This note is used to comply with regulatory documentation for home oxygen)  Patient Saturations on Room Air at Rest = 90%  Patient Saturations on Room Air while Ambulating = 84%  Patient Saturations on 2Liters of oxygen while Ambulating = 91%  Please briefly explain why patient needs home oxygen: Pt become SHOB when ambulating on RA

## 2019-07-21 NOTE — Discharge Instructions (Signed)
Your positive COVID test was on 11/17. You need to remain on home quarantine for 21 days from the day of your diagnosis. Your quarantine period ENDS on 08/06/2019.     COVID-19 COVID-19 is a respiratory infection that is caused by a virus called severe acute respiratory syndrome coronavirus 2 (SARS-CoV-2). The disease is also known as coronavirus disease or novel coronavirus. In some people, the virus may not cause any symptoms. In others, it may cause a serious infection. The infection can get worse quickly and can lead to complications, such as:  Pneumonia, or infection of the lungs.  Acute respiratory distress syndrome or ARDS. This is fluid build-up in the lungs.  Acute respiratory failure. This is a condition in which there is not enough oxygen passing from the lungs to the body.  Sepsis or septic shock. This is a serious bodily reaction to an infection.  Blood clotting problems.  Secondary infections due to bacteria or fungus. The virus that causes COVID-19 is contagious. This means that it can spread from person to person through droplets from coughs and sneezes (respiratory secretions). What are the causes? This illness is caused by a virus. You may catch the virus by:  Breathing in droplets from an infected person's cough or sneeze.  Touching something, like a table or a doorknob, that was exposed to the virus (contaminated) and then touching your mouth, nose, or eyes. What increases the risk? Risk for infection You are more likely to be infected with this virus if you:  Live in or travel to an area with a COVID-19 outbreak.  Come in contact with a sick person who recently traveled to an area with a COVID-19 outbreak.  Provide care for or live with a person who is infected with COVID-19. Risk for serious illness You are more likely to become seriously ill from the virus if you:  Are 20 years of age or older.  Have a long-term disease that lowers your body's ability to  fight infection (immunocompromised).  Live in a nursing home or long-term care facility.  Have a long-term (chronic) disease such as: ? Chronic lung disease, including chronic obstructive pulmonary disease or asthma ? Heart disease. ? Diabetes. ? Chronic kidney disease. ? Liver disease.  Are obese. What are the signs or symptoms? Symptoms of this condition can range from mild to severe. Symptoms may appear any time from 2 to 14 days after being exposed to the virus. They include:  A fever.  A cough.  Difficulty breathing.  Chills.  Muscle pains.  A sore throat.  Loss of taste or smell. Some people may also have stomach problems, such as nausea, vomiting, or diarrhea. Other people may not have any symptoms of COVID-19. How is this diagnosed? This condition may be diagnosed based on:  Your signs and symptoms, especially if: ? You live in an area with a COVID-19 outbreak. ? You recently traveled to or from an area where the virus is common. ? You provide care for or live with a person who was diagnosed with COVID-19.  A physical exam.  Lab tests, which may include: ? A nasal swab to take a sample of fluid from your nose. ? A throat swab to take a sample of fluid from your throat. ? A sample of mucus from your lungs (sputum). ? Blood tests.  Imaging tests, which may include, X-rays, CT scan, or ultrasound. How is this treated? At present, there is no medicine to treat COVID-19. Medicines that treat  other diseases are being used on a trial basis to see if they are effective against COVID-19. Your health care provider will talk with you about ways to treat your symptoms. For most people, the infection is mild and can be managed at home with rest, fluids, and over-the-counter medicines. Treatment for a serious infection usually takes places in a hospital intensive care unit (ICU). It may include one or more of the following treatments. These treatments are given until your  symptoms improve.  Receiving fluids and medicines through an IV.  Supplemental oxygen. Extra oxygen is given through a tube in the nose, a face mask, or a hood.  Positioning you to lie on your stomach (prone position). This makes it easier for oxygen to get into the lungs.  Continuous positive airway pressure (CPAP) or bi-level positive airway pressure (BPAP) machine. This treatment uses mild air pressure to keep the airways open. A tube that is connected to a motor delivers oxygen to the body.  Ventilator. This treatment moves air into and out of the lungs by using a tube that is placed in your windpipe.  Tracheostomy. This is a procedure to create a hole in the neck so that a breathing tube can be inserted.  Extracorporeal membrane oxygenation (ECMO). This procedure gives the lungs a chance to recover by taking over the functions of the heart and lungs. It supplies oxygen to the body and removes carbon dioxide. Follow these instructions at home: Lifestyle  If you are sick, stay home except to get medical care. Your health care provider will tell you how long to stay home. Call your health care provider before you go for medical care.  Rest at home as told by your health care provider.  Do not use any products that contain nicotine or tobacco, such as cigarettes, e-cigarettes, and chewing tobacco. If you need help quitting, ask your health care provider.  Return to your normal activities as told by your health care provider. Ask your health care provider what activities are safe for you. General instructions  Take over-the-counter and prescription medicines only as told by your health care provider.  Drink enough fluid to keep your urine pale yellow.  Keep all follow-up visits as told by your health care provider. This is important. How is this prevented?  There is no vaccine to help prevent COVID-19 infection. However, there are steps you can take to protect yourself and others  from this virus. To protect yourself:   Do not travel to areas where COVID-19 is a risk. The areas where COVID-19 is reported change often. To identify high-risk areas and travel restrictions, check the CDC travel website: FatFares.com.br  If you live in, or must travel to, an area where COVID-19 is a risk, take precautions to avoid infection. ? Stay away from people who are sick. ? Wash your hands often with soap and water for 20 seconds. If soap and water are not available, use an alcohol-based hand sanitizer. ? Avoid touching your mouth, face, eyes, or nose. ? Avoid going out in public, follow guidance from your state and local health authorities. ? If you must go out in public, wear a cloth face covering or face mask. ? Disinfect objects and surfaces that are frequently touched every day. This may include:  Counters and tables.  Doorknobs and light switches.  Sinks and faucets.  Electronics, such as phones, remote controls, keyboards, computers, and tablets. To protect others: If you have symptoms of COVID-19, take steps to  prevent the virus from spreading to others.  If you think you have a COVID-19 infection, contact your health care provider right away. Tell your health care team that you think you may have a COVID-19 infection.  Stay home. Leave your house only to seek medical care. Do not use public transport.  Do not travel while you are sick.  Wash your hands often with soap and water for 20 seconds. If soap and water are not available, use alcohol-based hand sanitizer.  Stay away from other members of your household. Let healthy household members care for children and pets, if possible. If you have to care for children or pets, wash your hands often and wear a mask. If possible, stay in your own room, separate from others. Use a different bathroom.  Make sure that all people in your household wash their hands well and often.  Cough or sneeze into a tissue  or your sleeve or elbow. Do not cough or sneeze into your hand or into the air.  Wear a cloth face covering or face mask. Where to find more information  Centers for Disease Control and Prevention: PurpleGadgets.be  World Health Organization: https://www.castaneda.info/ Contact a health care provider if:  You live in or have traveled to an area where COVID-19 is a risk and you have symptoms of the infection.  You have had contact with someone who has COVID-19 and you have symptoms of the infection. Get help right away if:  You have trouble breathing.  You have pain or pressure in your chest.  You have confusion.  You have bluish lips and fingernails.  You have difficulty waking from sleep.  You have symptoms that get worse. These symptoms may represent a serious problem that is an emergency. Do not wait to see if the symptoms will go away. Get medical help right away. Call your local emergency services (911 in the [place] Do not drive yourself to the hospital. Let the emergency medical personnel know if you think you have COVID-19. Summary  COVID-19 is a respiratory infection that is caused by a virus. It is also known as coronavirus disease or novel coronavirus. It can cause serious infections, such as pneumonia, acute respiratory distress syndrome, acute respiratory failure, or sepsis.  The virus that causes COVID-19 is contagious. This means that it can spread from person to person through droplets from coughs and sneezes.  You are more likely to develop a serious illness if you are 59 years of age or older, have a weak immunity, live in a nursing home, or have chronic disease.  There is no medicine to treat COVID-19. Your health care provider will talk with you about ways to treat your symptoms.  Take steps to protect yourself and others from infection. Wash your hands often and disinfect objects and surfaces that are frequently touched  every day. Stay away from people who are sick and wear a mask if you are sick. This information is not intended to replace advice given to you by your health care provider. Make sure you discuss any questions you have with your health care provider. Document Released: 09/20/2018 Document Revised: 01/10/2019 Document Reviewed: 09/20/2018 Elsevier Patient Education  2020 [place]: How to Protect Yourself and Others Know how it spreads  There is currently no vaccine to prevent coronavirus disease 2019 (COVID-19).  The best way to prevent illness is to avoid being exposed to this virus.  The virus is thought to spread mainly from person-to-person. ?  Between people who are in close contact with one another (within about 6 feet). ? Through respiratory droplets produced when an infected person coughs, sneezes or talks. ? These droplets can land in the mouths or noses of people who are nearby or possibly be inhaled into the lungs. ? Some recent studies have suggested that COVID-19 may be spread by people who are not showing symptoms. Everyone should Clean your hands often  Wash your hands often with soap and water for at least 20 seconds especially after you have been in a public place, or after blowing your nose, coughing, or sneezing.  If soap and water are not readily available, use a hand sanitizer that contains at least 60% alcohol. Cover all surfaces of your hands and rub them together until they feel dry.  Avoid touching your eyes, nose, and mouth with unwashed hands. Avoid close contact  Stay home if you are sick.  Avoid close contact with people who are sick.  Put distance between yourself and other people. ? Remember that some people without symptoms may be able to spread virus. ? This is especially important for people who are at higher risk of getting very GainPain.com.cy Cover your mouth  and nose with a cloth face cover when around others  You could spread COVID-19 to others even if you do not feel sick.  Everyone should wear a cloth face cover when they have to go out in public, for example to the grocery store or to pick up other necessities. ? Cloth face coverings should not be placed on young children under age 66, anyone who has trouble breathing, or is unconscious, incapacitated or otherwise unable to remove the mask without assistance.  The cloth face cover is meant to protect other people in case you are infected.  Do NOT use a facemask meant for a Dietitian.  Continue to keep about 6 feet between yourself and others. The cloth face cover is not a substitute for social distancing. Cover coughs and sneezes  If you are in a private setting and do not have on your cloth face covering, remember to always cover your mouth and nose with a tissue when you cough or sneeze or use the inside of your elbow.  Throw used tissues in the trash.  Immediately wash your hands with soap and water for at least 20 seconds. If soap and water are not readily available, clean your hands with a hand sanitizer that contains at least 60% alcohol. Clean and disinfect  Clean AND disinfect frequently touched surfaces daily. This includes tables, doorknobs, light switches, countertops, handles, desks, phones, keyboards, toilets, faucets, and sinks. RackRewards.fr  If surfaces are dirty, clean them: Use detergent or soap and water prior to disinfection.  Then, use a household disinfectant. You can see a list of EPA-registered household disinfectants here. michellinders.com 01/01/2019 This information is not intended to replace advice given to you by your health care provider. Make sure you discuss any questions you have with your health care provider. Document Released: 12/11/2018 Document Revised: 01/09/2019 Document  Reviewed: 12/11/2018 Elsevier Patient Education  2020 [employer].   COVID-19 Frequently Asked Questions COVID-19 (coronavirus disease) is an infection that is caused by a large family of viruses. Some viruses cause illness in people and others cause illness in animals like camels, cats, and bats. In some cases, the viruses that cause illness in animals can spread to humans. Where did the coronavirus come from? In December 2019, [place] told the [employer]  Organization [facility]) of several cases of lung disease (human respiratory illness). These cases were linked to an open seafood and livestock market in the city of [place]. The link to the seafood and livestock market suggests that the virus may have spread from animals to humans. However, since that first outbreak in December, the virus has also been shown to spread from person to person. What is the name of the disease and the virus? Disease name Early on, this disease was called novel coronavirus. This is because scientists determined that the disease was caused by a new (novel) respiratory virus. The World Health Organization [facility]) has now named the disease COVID-19, or coronavirus disease. Virus name The virus that causes the disease is called severe acute respiratory syndrome coronavirus 2 (SARS-CoV-2). More information on disease and virus naming World Health Organization [facility]): www.who.int/emergencies/diseases/novel-coronavirus-2019/technical-guidance/naming-the-coronavirus-disease-(covid-2019)-and-the-virus-that-causes-it Who is at risk for complications from coronavirus disease? Some people may be at higher risk for complications from coronavirus disease. This includes older adults and people who have chronic diseases, such as heart disease, diabetes, and lung disease. If you are at higher risk for complications, take these extra precautions:  Avoid close contact with people who are sick or have a fever or cough. Stay at least 3-6 ft (1-2  m) away from them, if possible.  Wash your hands often with soap and water for at least 20 seconds.  Avoid touching your face, mouth, nose, or eyes.  Keep supplies on hand at home, such as food, medicine, and cleaning supplies.  Stay home as much as possible.  Avoid social gatherings and travel. How does coronavirus disease spread? The virus that causes coronavirus disease spreads easily from person to person (is contagious). There are also cases of community-spread disease. This means the disease has spread to:  People who have no known contact with other infected people.  People who have not traveled to areas where there are known cases. It appears to spread from one person to another through droplets from coughing or sneezing. Can I get the virus from touching surfaces or objects? There is still a lot that we do not know about the virus that causes coronavirus disease. Scientists are basing a lot of information on what they know about similar viruses, such as:  Viruses cannot generally survive on surfaces for long. They need a human body (host) to survive.  It is more likely that the virus is spread by close contact with people who are sick (direct contact), such as through: ? Shaking hands or hugging. ? Breathing in respiratory droplets that travel through the air. This can happen when an infected person coughs or sneezes on or near other people.  It is less likely that the virus is spread when a person touches a surface or object that has the virus on it (indirect contact). The virus may be able to enter the body if the person touches a surface or object and then touches his or her face, eyes, nose, or mouth. Can a person spread the virus without having symptoms of the disease? It may be possible for the virus to spread before a person has symptoms of the disease, but this is most likely not the main way the virus is spreading. It is more likely for the virus to spread by being in  close contact with people who are sick and breathing in the respiratory droplets of a sick person's cough or sneeze. What are the symptoms of coronavirus disease? Symptoms vary from person to person  and can range from mild to severe. Symptoms may include:  Fever.  Cough.  Tiredness, weakness, or fatigue.  Fast breathing or feeling short of breath. These symptoms can appear anywhere from 2 to 14 days after you have been exposed to the virus. If you develop symptoms, call your health care provider. People with severe symptoms may need hospital care. If I am exposed to the virus, how long does it take before symptoms start? Symptoms of coronavirus disease may appear anywhere from 2 to 14 days after a person has been exposed to the virus. If you develop symptoms, call your health care provider. Should I be tested for this virus? Your health care provider will decide whether to test you based on your symptoms, history of exposure, and your risk factors. How does a health care provider test for this virus? Health care providers will collect samples to send for testing. Samples may include:  Taking a swab of fluid from the nose.  Taking fluid from the lungs by having you cough up mucus (sputum) into a sterile cup.  Taking a blood sample.  Taking a stool or urine sample. Is there a treatment or vaccine for this virus? Currently, there is no vaccine to prevent coronavirus disease. Also, there are no medicines like antibiotics or antivirals to treat the virus. A person who becomes sick is given supportive care, which means rest and fluids. A person may also relieve his or her symptoms by using over-the-counter medicines that treat sneezing, coughing, and runny nose. These are the same medicines that a person takes for the common cold. If you develop symptoms, call your health care provider. People with severe symptoms may need hospital care. What can I do to protect myself and my family from this  virus?     You can protect yourself and your family by taking the same actions that you would take to prevent the spread of other viruses. Take the following actions:  Wash your hands often with soap and water for at least 20 seconds. If soap and water are not available, use alcohol-based hand sanitizer.  Avoid touching your face, mouth, nose, or eyes.  Cough or sneeze into a tissue, sleeve, or elbow. Do not cough or sneeze into your hand or the air. ? If you cough or sneeze into a tissue, throw it away immediately and wash your hands.  Disinfect objects and surfaces that you frequently touch every day.  Avoid close contact with people who are sick or have a fever or cough. Stay at least 3-6 ft (1-2 m) away from them, if possible.  Stay home if you are sick, except to get medical care. Call your health care provider before you get medical care.  Make sure your vaccines are up to date. Ask your health care provider what vaccines you need. What should I do if I need to travel? Follow travel recommendations from your local health authority, the CDC, and WHO. Travel information and advice  Centers for Disease Control and Prevention (CDC): BodyEditor.hu  World Health Organization [facility]): ThirdIncome.ca Know the risks and take action to protect your health  You are at higher risk of getting coronavirus disease if you are traveling to areas with an outbreak or if you are exposed to travelers from areas with an outbreak.  Wash your hands often and practice good hygiene to lower the risk of catching or spreading the virus. What should I do if I am sick? General instructions to stop the spread  of infection  Wash your hands often with soap and water for at least 20 seconds. If soap and water are not available, use alcohol-based hand sanitizer.  Cough or sneeze into a tissue, sleeve, or elbow. Do  not cough or sneeze into your hand or the air.  If you cough or sneeze into a tissue, throw it away immediately and wash your hands.  Stay home unless you must get medical care. Call your health care provider or local health authority before you get medical care.  Avoid public areas. Do not take public transportation, if possible.  If you can, wear a mask if you must go out of the house or if you are in close contact with someone who is not sick. Keep your home clean  Disinfect objects and surfaces that are frequently touched every day. This may include: ? Counters and tables. ? Doorknobs and light switches. ? Sinks and faucets. ? Electronics such as phones, remote controls, keyboards, computers, and tablets.  Wash dishes in hot, soapy water or use a dishwasher. Air-dry your dishes.  Wash laundry in hot water. Prevent infecting other household members  Let healthy household members care for children and pets, if possible. If you have to care for children or pets, wash your hands often and wear a mask.  Sleep in a different bedroom or bed, if possible.  Do not share personal items, such as razors, toothbrushes, deodorant, combs, brushes, towels, and washcloths. Where to find more information Centers for Disease Control and Prevention (CDC)  Information and news updates: https://www.butler-gonzalez.com/ World Health Organization [facility])  Information and news updates: MissExecutive.com.ee  Coronavirus health topic: https://www.castaneda.info/  Questions and answers on COVID-19: OpportunityDebt.at  Global tracker: who.sprinklr.com American Academy of Pediatrics (AAP)  Information for families: www.healthychildren.org/English/health-issues/conditions/chest-lungs/Pages/2019-Novel-Coronavirus.aspx The coronavirus situation is changing rapidly. Check your local health authority website or the CDC and [facility]  websites for updates and news. When should I contact a health care provider?  Contact your health care provider if you have symptoms of an infection, such as fever or cough, and you: ? Have been near anyone who is known to have coronavirus disease. ? Have come into contact with a person who is suspected to have coronavirus disease. ? Have traveled outside of the country. When should I get emergency medical care?  Get help right away by calling your local emergency services (911 in the [place] if you have: ? Trouble breathing. ? Pain or pressure in your chest. ? Confusion. ? Blue-tinged lips and fingernails. ? Difficulty waking from sleep. ? Symptoms that get worse. Let the emergency medical personnel know if you think you have coronavirus disease. Summary  A new respiratory virus is spreading from person to person and causing COVID-19 (coronavirus disease).  The virus that causes COVID-19 appears to spread easily. It spreads from one person to another through droplets from coughing or sneezing.  Older adults and those with chronic diseases are at higher risk of disease. If you are at higher risk for complications, take extra precautions.  There is currently no vaccine to prevent coronavirus disease. There are no medicines, such as antibiotics or antivirals, to treat the virus.  You can protect yourself and your family by washing your hands often, avoiding touching your face, and covering your coughs and sneezes. This information is not intended to replace advice given to you by your health care provider. Make sure you discuss any questions you have with your health care provider. Document Released: 12/11/2018 Document Revised: 12/11/2018  Document Reviewed: 12/11/2018 Elsevier Patient Education  [employer].

## 2019-07-21 NOTE — Progress Notes (Signed)
Patient sleeping sounding, no respiratory distress noted throughout the night, patient is ambulatory and uses the bathroom independently, vss

## 2020-06-05 ENCOUNTER — Encounter (HOSPITAL_COMMUNITY): Payer: Self-pay | Admitting: Emergency Medicine

## 2020-06-05 ENCOUNTER — Other Ambulatory Visit: Payer: Self-pay

## 2020-06-05 ENCOUNTER — Ambulatory Visit (HOSPITAL_COMMUNITY)
Admission: EM | Admit: 2020-06-05 | Discharge: 2020-06-05 | Disposition: A | Discharge status: Home / Self Care | Payer: Self-pay | Attending: Family Medicine | Admitting: Family Medicine

## 2020-06-05 DIAGNOSIS — B354 Tinea corporis: Secondary | ICD-10-CM

## 2020-06-05 MED ORDER — CLOTRIMAZOLE-BETAMETHASONE 1-0.05 % EX CREA: TOPICAL_CREAM | CUTANEOUS | 0 refills | Status: DC

## 2020-06-05 MED ORDER — FLUCONAZOLE 150 MG PO TABS: 150.0000 mg | ORAL_TABLET | Freq: Every day | ORAL | 0 refills | Status: AC

## 2020-06-05 NOTE — ED Triage Notes (Signed)
Pt presents with spider bite. States happened 2-3 months ago. States started having skin reaction after getting bit. States located on face and genital area.

## 2020-06-05 NOTE — Discharge Instructions (Signed)
Take the medication once weekly for the next 4 weeks. Cream as prescribed Follow up as needed for continued or worsening symptoms
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
## 2021-03-13 ENCOUNTER — Other Ambulatory Visit: Payer: Self-pay

## 2021-03-13 ENCOUNTER — Ambulatory Visit (HOSPITAL_COMMUNITY)
Admission: EM | Admit: 2021-03-13 | Discharge: 2021-03-13 | Disposition: A | Discharge status: Home / Self Care | Payer: Medicaid Other | Attending: Internal Medicine | Admitting: Internal Medicine

## 2021-03-13 DIAGNOSIS — J069 Acute upper respiratory infection, unspecified: Secondary | ICD-10-CM

## 2021-03-13 DIAGNOSIS — R188 Other ascites: Secondary | ICD-10-CM

## 2021-03-13 DIAGNOSIS — K7460 Unspecified cirrhosis of liver: Secondary | ICD-10-CM

## 2021-03-13 DIAGNOSIS — M940 Chondrocostal junction syndrome [Tietze]: Secondary | ICD-10-CM

## 2021-03-13 DIAGNOSIS — Z0189 Encounter for other specified special examinations: Secondary | ICD-10-CM

## 2021-03-13 DIAGNOSIS — M654 Radial styloid tenosynovitis [de Quervain]: Secondary | ICD-10-CM

## 2021-03-13 LAB — COMPREHENSIVE METABOLIC PANEL
ALT: 40 U/L (ref 0–44)
AST: 36 U/L (ref 15–41)
Albumin: 4 g/dL (ref 3.5–5.0)
Alkaline Phosphatase: 55 U/L (ref 38–126)
Anion gap: 5 (ref 5–15)
BUN: 9 mg/dL (ref 8–23)
CO2: 26 mmol/L (ref 22–32)
Calcium: 9 mg/dL (ref 8.9–10.3)
Chloride: 109 mmol/L (ref 98–111)
Creatinine, Ser: 1.11 mg/dL (ref 0.61–1.24)
GFR, Estimated: >60 mL/min (ref >60)
Glucose, Bld: 94 mg/dL (ref 70–99)
Potassium: 4.1 mmol/L (ref 3.5–5.1)
Sodium: 140 mmol/L (ref 135–145)
Total Bilirubin: 1 mg/dL (ref 0.3–1.2)
Total Protein: 7.7 g/dL (ref 6.5–8.1)

## 2021-03-13 NOTE — Discharge Instructions (Addendum)
-  PLEASE FOLLOW-UP WITH YOUR PCP/[facility] TO DISCUSS YOUR LIVER/ABDOMINAL SWELLING. IF SYMPTOMS GET WORSE (ABDOMINAL PAIN, SHORTNESS OF BREATH, CHEST PAIN, SWELLING IN LEGS)- HEAD STRAIGHT TO THE ER. -For your rib pain, ibuprofen.  Avoid Tylenol. -For your wrist pain (de Quervain's tenosynovitis), ibuprofen, rest.  Follow-up with PCP for Ortho referral if symptoms persist. -For your virus, over-the-counter medications for relief.  Consider at home COVID test.
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
## 2021-03-13 NOTE — ED Triage Notes (Signed)
Pt reports Sx' s started over one week ago. Pt has cough ,reflux and his ABD is hard. Pt also reports pin and swelling to Lt wrist for over a week with out injury.

## 2021-04-04 ENCOUNTER — Encounter (HOSPITAL_COMMUNITY): Payer: Self-pay | Admitting: Emergency Medicine

## 2021-04-04 ENCOUNTER — Emergency Department (HOSPITAL_COMMUNITY): Payer: Self-pay

## 2021-04-04 ENCOUNTER — Emergency Department (HOSPITAL_COMMUNITY)
Admission: EM | Admit: 2021-04-04 | Discharge: 2021-04-05 | Disposition: A | Discharge status: Home / Self Care | Payer: Self-pay | Attending: Student | Admitting: Student

## 2021-04-04 ENCOUNTER — Other Ambulatory Visit: Payer: Self-pay

## 2021-04-04 DIAGNOSIS — J449 Chronic obstructive pulmonary disease, unspecified: Secondary | ICD-10-CM | POA: Insufficient documentation

## 2021-04-04 DIAGNOSIS — R0789 Other chest pain: Secondary | ICD-10-CM | POA: Insufficient documentation

## 2021-04-04 DIAGNOSIS — Z87891 Personal history of nicotine dependence: Secondary | ICD-10-CM | POA: Insufficient documentation

## 2021-04-04 DIAGNOSIS — Z8616 Personal history of COVID-19: Secondary | ICD-10-CM | POA: Insufficient documentation

## 2021-04-04 LAB — COMPREHENSIVE METABOLIC PANEL
ALT: 37 U/L (ref 0–44)
AST: 38 U/L (ref 15–41)
Albumin: 4.1 g/dL (ref 3.5–5.0)
Alkaline Phosphatase: 74 U/L (ref 38–126)
Anion gap: 8 (ref 5–15)
BUN: 12 mg/dL (ref 8–23)
CO2: 24 mmol/L (ref 22–32)
Calcium: 9.1 mg/dL (ref 8.9–10.3)
Chloride: 107 mmol/L (ref 98–111)
Creatinine, Ser: 1.2 mg/dL (ref 0.61–1.24)
GFR, Estimated: >60 mL/min (ref >60)
Glucose, Bld: 92 mg/dL (ref 70–99)
Potassium: 3.9 mmol/L (ref 3.5–5.1)
Sodium: 139 mmol/L (ref 135–145)
Total Bilirubin: 1.3 mg/dL — ABNORMAL HIGH (ref 0.3–1.2)
Total Protein: 7.7 g/dL (ref 6.5–8.1)

## 2021-04-04 LAB — CBC
HCT: 48.3 % (ref 39.0–52.0)
Hemoglobin: 15.8 g/dL (ref 13.0–17.0)
MCH: 30.3 pg (ref 26.0–34.0)
MCHC: 32.7 g/dL (ref 30.0–36.0)
MCV: 92.5 fL (ref 80.0–100.0)
Platelets: 201 10*3/uL (ref 150–400)
RBC: 5.22 MIL/uL (ref 4.22–5.81)
RDW: 13.7 % (ref 11.5–15.5)
WBC: 4 10*3/uL (ref 4.0–10.5)
nRBC: 0 % (ref 0.0–0.2)

## 2021-04-04 LAB — TROPONIN I (HIGH SENSITIVITY): Troponin I (High Sensitivity): 4 ng/L (ref <18)
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
## 2021-04-04 NOTE — ED Triage Notes (Signed)
C/o constant L sided chest pain x 2-3 weeks with SOB, nausea, and headache.

## 2021-04-04 NOTE — ED Provider Notes (Signed)
Emergency Medicine Provider Triage Evaluation Note  Dylan [name] , a 60 y.o. male  was evaluated in triage.  Pt complains of chest pain x3 weeks. Chest pain worse with movement/positional changes. No change in activity or heavy lifting. No cardiac history. Denies tobacco use. He admits to "sometimes" getting short of breath and nauseous. No relation to exertion.  Review of Systems  Positive: CP Negative: fever  Physical Exam  BP (!) 141/98 (BP Location: Right Arm)   Pulse 93   Temp 98.5 F (36.9 C)   Resp 18   SpO2 98%  Gen:   Awake, no distress   Resp:  Normal effort  MSK:   Moves extremities without difficulty  Other:  Reproducible left-sided chest pain without crepitus or deformity  Medical Decision Making  Medically screening exam initiated at 4:48 PM.  Appropriate orders placed.  Dylan [name] was informed that the remainder of the evaluation will be completed by another provider, this initial triage assessment does not replace that evaluation, and the importance of remaining in the ED until their evaluation is complete.  Cardiac labs   Karie Kirks 04/04/21 1650    Regan Lemming, MD 04/04/21 [phone]

## 2021-04-05 LAB — TROPONIN I (HIGH SENSITIVITY): Troponin I (High Sensitivity): 3 ng/L (ref <18)

## 2021-04-05 MED ORDER — IBUPROFEN 800 MG PO TABS
800.0000 mg | ORAL_TABLET | Freq: Once | ORAL | Status: AC
  Administered 2021-04-05: 800 mg via ORAL
  Filled 2021-04-05: qty 1

## 2021-04-05 MED ORDER — CYCLOBENZAPRINE HCL 10 MG PO TABS: 10.0000 mg | ORAL_TABLET | Freq: Three times a day (TID) | ORAL | 0 refills | Status: DC | PRN

## 2021-04-05 MED ORDER — CYCLOBENZAPRINE HCL 10 MG PO TABS
10.0000 mg | ORAL_TABLET | Freq: Once | ORAL | Status: AC
  Administered 2021-04-05: 10 mg via ORAL
  Filled 2021-04-05: qty 1

## 2021-04-05 MED ORDER — IBUPROFEN 800 MG PO TABS: 800.0000 mg | ORAL_TABLET | Freq: Three times a day (TID) | ORAL | 0 refills | Status: DC

## 2021-04-05 NOTE — ED Notes (Signed)
E-signature pad unavailable at time of pt discharge. This RN discussed discharge materials with pt and answered all pt questions. Pt stated understanding of discharge material. ? ?

## 2021-07-04 ENCOUNTER — Emergency Department (HOSPITAL_COMMUNITY)
Admission: EM | Admit: 2021-07-04 | Discharge: 2021-07-05 | Disposition: A | Discharge status: Home / Self Care | Payer: Self-pay | Attending: Emergency Medicine | Admitting: Emergency Medicine

## 2021-07-04 ENCOUNTER — Encounter (HOSPITAL_COMMUNITY): Payer: Self-pay | Admitting: Emergency Medicine

## 2021-07-04 ENCOUNTER — Emergency Department (HOSPITAL_COMMUNITY): Payer: Self-pay

## 2021-07-04 ENCOUNTER — Other Ambulatory Visit: Payer: Self-pay

## 2021-07-04 DIAGNOSIS — J441 Chronic obstructive pulmonary disease with (acute) exacerbation: Secondary | ICD-10-CM | POA: Insufficient documentation

## 2021-07-04 DIAGNOSIS — Z8616 Personal history of COVID-19: Secondary | ICD-10-CM | POA: Insufficient documentation

## 2021-07-04 DIAGNOSIS — R911 Solitary pulmonary nodule: Secondary | ICD-10-CM | POA: Insufficient documentation

## 2021-07-04 DIAGNOSIS — Z87891 Personal history of nicotine dependence: Secondary | ICD-10-CM | POA: Insufficient documentation

## 2021-07-04 LAB — CBC WITH DIFFERENTIAL/PLATELET
Abs Immature Granulocytes: 0.01 10*3/uL (ref 0.00–0.07)
Basophils Absolute: 0.1 10*3/uL (ref 0.0–0.1)
Basophils Relative: 1 %
Eosinophils Absolute: 0.7 10*3/uL — ABNORMAL HIGH (ref 0.0–0.5)
Eosinophils Relative: 13 %
HCT: 47.9 % (ref 39.0–52.0)
Hemoglobin: 15.9 g/dL (ref 13.0–17.0)
Immature Granulocytes: 0 %
Lymphocytes Relative: 31 %
Lymphs Abs: 1.7 10*3/uL (ref 0.7–4.0)
MCH: 30.6 pg (ref 26.0–34.0)
MCHC: 33.2 g/dL (ref 30.0–36.0)
MCV: 92.3 fL (ref 80.0–100.0)
Monocytes Absolute: 0.7 10*3/uL (ref 0.1–1.0)
Monocytes Relative: 14 %
Neutro Abs: 2.2 10*3/uL (ref 1.7–7.7)
Neutrophils Relative %: 41 %
Platelets: 199 10*3/uL (ref 150–400)
RBC: 5.19 MIL/uL (ref 4.22–5.81)
RDW: 13.2 % (ref 11.5–15.5)
WBC: 5.3 10*3/uL (ref 4.0–10.5)
nRBC: 0 % (ref 0.0–0.2)

## 2021-07-04 LAB — BASIC METABOLIC PANEL
Anion gap: 8 (ref 5–15)
BUN: 14 mg/dL (ref 8–23)
CO2: 25 mmol/L (ref 22–32)
Calcium: 9.2 mg/dL (ref 8.9–10.3)
Chloride: 106 mmol/L (ref 98–111)
Creatinine, Ser: 1.25 mg/dL — ABNORMAL HIGH (ref 0.61–1.24)
GFR, Estimated: >60 mL/min (ref >60)
Glucose, Bld: 89 mg/dL (ref 70–99)
Potassium: 4.3 mmol/L (ref 3.5–5.1)
Sodium: 139 mmol/L (ref 135–145)

## 2021-07-04 LAB — BRAIN NATRIURETIC PEPTIDE: B Natriuretic Peptide: 5.4 pg/mL (ref 0.0–100.0)

## 2021-07-04 LAB — TROPONIN I (HIGH SENSITIVITY)
Troponin I (High Sensitivity): 3 ng/L (ref <18)
Troponin I (High Sensitivity): 3 ng/L (ref <18)

## 2021-07-04 MED ORDER — ALBUTEROL SULFATE HFA 108 (90 BASE) MCG/ACT IN AERS
4.0000 | INHALATION_SPRAY | Freq: Once | RESPIRATORY_TRACT | Status: AC
  Administered 2021-07-04: 4 via RESPIRATORY_TRACT
  Filled 2021-07-04: qty 6.7

## 2021-07-04 MED ORDER — PREDNISONE 10 MG PO TABS: 40.0000 mg | ORAL_TABLET | Freq: Every day | ORAL | 0 refills | Status: AC

## 2021-07-04 MED ORDER — FLUTICASONE PROPIONATE HFA 44 MCG/ACT IN AERO
2.0000 | INHALATION_SPRAY | Freq: Once | RESPIRATORY_TRACT | Status: AC
  Administered 2021-07-04: 2 via RESPIRATORY_TRACT
  Filled 2021-07-04: qty 10.6

## 2021-07-04 MED ORDER — PREDNISONE 20 MG PO TABS
60.0000 mg | ORAL_TABLET | Freq: Once | ORAL | Status: AC
  Administered 2021-07-04: 60 mg via ORAL
  Filled 2021-07-04: qty 3

## 2021-07-04 NOTE — ED Triage Notes (Signed)
Pt reports wheezing, cough with white phlegm, body aches, and L sided chest pain ("feels like air trapped") x 2 months.  Denies fever and chills.

## 2021-07-04 NOTE — ED Provider Notes (Signed)
Emergency Medicine Provider Triage Evaluation Note  Dylan [name] , a 60 y.o. male  was evaluated in triage.  Pt complains of cough and wheezing for the past 2 months.  Complains of left-sided chest pain for the past 2 months.  He states that last night he coughed and a large amount of phlegm came out.  He states the phlegm was sticking to his hand and did not easily come off when he put his hand under the sink which caused him to have concern.  Patient states that he wanted to come to the ED today for further evaluation.  He does report history of smoking in the past however quit 19 years ago.  He denies any history of asthma or COPD.  He denies any fevers or chills.  States he wants a CT scan of his chest.  Review of Systems  Positive: + cough, SOB, chest pain, wheezing Negative: - nausea, vomiting, leg swelling  Physical Exam  BP (!) 155/90 (BP Location: Right Arm)   Pulse 99   Temp 99.3 F (37.4 C) (Oral)   Resp 18   SpO2 95%  Gen:   Awake, no distress   Resp:  Normal effort. Mild end expiratory wheezing.  MSK:   Moves extremities without difficulty  Other:    Medical Decision Making  Medically screening exam initiated at 2:22 PM.  Appropriate orders placed.  Dylan [name] was informed that the remainder of the evaluation will be completed by another provider, this initial triage assessment does not replace that evaluation, and the importance of remaining in the ED until their evaluation is complete.     Dylan Maize, PA-C 07/04/21 1424    Dylan Johns, MD 07/04/21 1444

## 2021-07-04 NOTE — Discharge Instructions (Signed)
Take the flovent, 2 puffs once daily and take the albuterol 2 puffs as needed every 4-6 hours for shortness of breath or wheezing.
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
## 2021-07-15 ENCOUNTER — Emergency Department (HOSPITAL_COMMUNITY): Payer: Self-pay

## 2021-07-15 ENCOUNTER — Other Ambulatory Visit: Payer: Self-pay

## 2021-07-15 ENCOUNTER — Encounter (HOSPITAL_COMMUNITY): Payer: Self-pay

## 2021-07-15 ENCOUNTER — Emergency Department (HOSPITAL_COMMUNITY)
Admission: EM | Admit: 2021-07-15 | Discharge: 2021-07-15 | Disposition: A | Discharge status: Home / Self Care | Payer: Self-pay | Attending: Emergency Medicine | Admitting: Emergency Medicine

## 2021-07-15 DIAGNOSIS — J40 Bronchitis, not specified as acute or chronic: Secondary | ICD-10-CM | POA: Insufficient documentation

## 2021-07-15 DIAGNOSIS — Z8616 Personal history of COVID-19: Secondary | ICD-10-CM | POA: Insufficient documentation

## 2021-07-15 DIAGNOSIS — J441 Chronic obstructive pulmonary disease with (acute) exacerbation: Secondary | ICD-10-CM | POA: Insufficient documentation

## 2021-07-15 DIAGNOSIS — R0789 Other chest pain: Secondary | ICD-10-CM

## 2021-07-15 DIAGNOSIS — Z87891 Personal history of nicotine dependence: Secondary | ICD-10-CM | POA: Insufficient documentation

## 2021-07-15 LAB — CBC
HCT: 48.2 % (ref 39.0–52.0)
Hemoglobin: 15.7 g/dL (ref 13.0–17.0)
MCH: 30.5 pg (ref 26.0–34.0)
MCHC: 32.6 g/dL (ref 30.0–36.0)
MCV: 93.6 fL (ref 80.0–100.0)
Platelets: 202 10*3/uL (ref 150–400)
RBC: 5.15 MIL/uL (ref 4.22–5.81)
RDW: 13.2 % (ref 11.5–15.5)
WBC: 4.8 10*3/uL (ref 4.0–10.5)
nRBC: 0 % (ref 0.0–0.2)

## 2021-07-15 LAB — BASIC METABOLIC PANEL
Anion gap: 8 (ref 5–15)
BUN: 10 mg/dL (ref 8–23)
CO2: 27 mmol/L (ref 22–32)
Calcium: 8.7 mg/dL — ABNORMAL LOW (ref 8.9–10.3)
Chloride: 104 mmol/L (ref 98–111)
Creatinine, Ser: 1.11 mg/dL (ref 0.61–1.24)
GFR, Estimated: >60 mL/min (ref >60)
Glucose, Bld: 110 mg/dL — ABNORMAL HIGH (ref 70–99)
Potassium: 3.8 mmol/L (ref 3.5–5.1)
Sodium: 139 mmol/L (ref 135–145)

## 2021-07-15 LAB — TROPONIN I (HIGH SENSITIVITY)
Troponin I (High Sensitivity): 4 ng/L (ref <18)
Troponin I (High Sensitivity): 3 ng/L (ref <18)

## 2021-07-15 MED ORDER — DEXTROMETHORPHAN POLISTIREX ER 30 MG/5ML PO SUER
15.0000 mg | ORAL | 0 refills | Status: DC | PRN
Start: 2021-07-15 — End: 2021-09-07

## 2021-07-15 MED ORDER — DOXYCYCLINE HYCLATE 100 MG PO CAPS: 100.0000 mg | ORAL_CAPSULE | Freq: Two times a day (BID) | ORAL | 0 refills | Status: DC

## 2021-07-15 MED ORDER — ALBUTEROL SULFATE HFA 108 (90 BASE) MCG/ACT IN AERS: 2.0000 | INHALATION_SPRAY | RESPIRATORY_TRACT | Status: DC | PRN

## 2021-07-15 MED ORDER — ALBUTEROL SULFATE (2.5 MG/3ML) 0.083% IN NEBU: 2.5000 mg | INHALATION_SOLUTION | Freq: Four times a day (QID) | RESPIRATORY_TRACT | 12 refills | Status: DC | PRN

## 2021-07-15 MED ORDER — ALBUTEROL SULFATE HFA 108 (90 BASE) MCG/ACT IN AERS
INHALATION_SPRAY | RESPIRATORY_TRACT | Status: AC
  Administered 2021-07-15: 07:00:00 2 via RESPIRATORY_TRACT
  Filled 2021-07-15: qty 6.7

## 2021-07-15 MED ORDER — IPRATROPIUM-ALBUTEROL 0.5-2.5 (3) MG/3ML IN SOLN
3.0000 mL | RESPIRATORY_TRACT | Status: AC
  Administered 2021-07-15 (×2): 3 mL via RESPIRATORY_TRACT
  Filled 2021-07-15 (×2): qty 3

## 2021-07-15 MED ORDER — DEXAMETHASONE 10 MG/ML FOR PEDIATRIC ORAL USE
10.0000 mg | Freq: Once | INTRAMUSCULAR | Status: AC
  Administered 2021-07-15: 10:00:00 10 mg via ORAL
  Filled 2021-07-15: qty 1

## 2021-07-15 MED ORDER — DEXAMETHASONE 10 MG/ML FOR PEDIATRIC ORAL USE
10.0000 mg | Freq: Once | INTRAMUSCULAR | Status: DC
  Filled 2021-07-15 (×2): qty 1

## 2021-07-15 MED ORDER — PREDNISONE 20 MG PO TABS: 40.0000 mg | ORAL_TABLET | Freq: Every day | ORAL | 0 refills | Status: AC

## 2021-07-15 NOTE — ED Notes (Signed)
Patient Alert and oriented to baseline. Stable and ambulatory to baseline. Patient verbalized understanding of the discharge instructions.  Patient belongings were taken by the patient.   

## 2021-07-15 NOTE — Discharge Instructions (Addendum)
Please follow up with PCP for CT of the chest in next 4 wks to evaluate lung nodule.

## 2021-07-15 NOTE — ED Triage Notes (Signed)
Pt reports SOB after waking up this morning. No relief with rescue inhaler. Pt with expiratory wheezing. Pt also endorses L sided CP stating "I think there's a nodule up there".
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
## 2021-07-28 ENCOUNTER — Ambulatory Visit (INDEPENDENT_AMBULATORY_CARE_PROVIDER_SITE_OTHER): Payer: Self-pay | Admitting: Pulmonary Disease

## 2021-07-28 ENCOUNTER — Encounter: Payer: Self-pay | Admitting: Pulmonary Disease

## 2021-07-28 ENCOUNTER — Other Ambulatory Visit: Payer: Self-pay

## 2021-07-28 VITALS — BP 132/70 | HR 112 | Temp 98.2°F | Ht 67.0 in | Wt 187.2 lb

## 2021-07-28 DIAGNOSIS — J441 Chronic obstructive pulmonary disease with (acute) exacerbation: Secondary | ICD-10-CM

## 2021-07-28 DIAGNOSIS — Z8616 Personal history of COVID-19: Secondary | ICD-10-CM

## 2021-07-28 DIAGNOSIS — Z87891 Personal history of nicotine dependence: Secondary | ICD-10-CM

## 2021-07-28 DIAGNOSIS — R911 Solitary pulmonary nodule: Secondary | ICD-10-CM

## 2021-07-28 MED ORDER — BREZTRI AEROSPHERE 160-9-4.8 MCG/ACT IN AERO: 2.0000 | INHALATION_SPRAY | Freq: Two times a day (BID) | RESPIRATORY_TRACT | 6 refills | Status: DC

## 2021-07-28 MED ORDER — PREDNISONE 10 MG PO TABS: ORAL_TABLET | ORAL | 0 refills | Status: DC

## 2021-07-28 MED ORDER — METHYLPREDNISOLONE ACETATE 80 MG/ML IJ SUSP: 80.0000 mg | Freq: Once | INTRAMUSCULAR | Status: DC

## 2021-07-28 MED ORDER — ALBUTEROL SULFATE (2.5 MG/3ML) 0.083% IN NEBU: 2.5000 mg | INHALATION_SOLUTION | Freq: Four times a day (QID) | RESPIRATORY_TRACT | 12 refills | Status: DC | PRN

## 2021-07-28 NOTE — Patient Instructions (Addendum)
Thank you for visiting Dr. Valeta Harms at [facility]. Today we recommend the following:  Orders Placed This Encounter  Procedures   CT CHEST HIGH RESOLUTION   Pulmonary Function Test   Meds ordered this encounter  Medications   predniSONE (DELTASONE) 10 MG tablet    Sig: Take 4 tabs by mouth once daily x4 days, then 3 tabs x4 days, 2 tabs x4 days, 1 tab x4 days and stop.    Dispense:  40 tablet    Refill:  0   albuterol (PROVENTIL) (2.5 MG/3ML) 0.083% nebulizer solution    Sig: Take 3 mLs (2.5 mg total) by nebulization every 6 (six) hours as needed for wheezing or shortness of breath.    Dispense:  75 mL    Refill:  12   Budeson-Glycopyrrol-Formoterol (BREZTRI AEROSPHERE) 160-9-4.8 MCG/ACT AERO    Sig: Inhale 2 puffs into the lungs in the morning and at bedtime.    Dispense:  10.7 g    Refill:  6   Injection of 80mg  Depomedrol  Samples of breztri New nebulizer machine and supplies   PFTS and HRCT complete prior to next office visit.   Return in about 4 weeks (around 08/25/2021) for w/ Dr. Valeta Harms.    Please do your part to reduce the spread of COVID-19.

## 2021-07-28 NOTE — Progress Notes (Signed)
Synopsis: Referred in November 2022 for lung nodule by Gareth Morgan, MD  Subjective:   PATIENT ID: Dylan [name] GENDER: male DOB: [birth year], MRN: [id number]  Chief Complaint  Patient presents with   Consult    Patient says he is having problems breathing and said hospital found nodule in left lung    [name], history of COPD, hepatitis C, smoker, cirrhosis.  Presents to the office today for evaluation of abnormal CT imaging.  He is a former smoker quit couple years ago.Patient recently was admitted to the emergency department for a COPD exacerbation.  He was started on steroids.  He recently was given a prescription for doxycycline as well.  He is almost through with this.  Patient had a CT scan of the chest in 2020 that did not have any significant nodular findings however at the time he had COVID-19 and had significant multifocal inflammatory areas within the chest.  His chest x-ray completed on 07/15/2021 revealed a nodular opacity within the left upper lobe that was also present on a chest x-ray, 07/04/2021.  I reviewed the images today with the patient in the office.   Past Medical History:  Diagnosis Date   Cholelithiasis    Cirrhosis ([place])    COPD (chronic obstructive pulmonary disease) ([facility])    1 year   Gallstones    Hepatitis C    3 years   PONV (postoperative nausea and vomiting)    Smoker    1/2 ppd for 32 years     Family History  Problem Relation Age of Onset   Diabetes Mother    Alzheimer's disease Mother    Diabetes Maternal Grandmother    Mesothelioma Father    Colon cancer Neg Hx    Stomach cancer Neg Hx      Past Surgical History:  Procedure Laterality Date   COLONOSCOPY WITH PROPOFOL N/A 12/11/2014   Procedure: COLONOSCOPY WITH PROPOFOL;  Surgeon: Inda Castle, MD;  [facility];  Service: Endoscopy;  Laterality: N/A;   ESOPHAGEAL BANDING N/A 12/11/2014   Procedure: ESOPHAGEAL BANDING;  Surgeon: Inda Castle,  MD;  Location: [place];  Service: Endoscopy;  Laterality: N/A;   ESOPHAGOGASTRODUODENOSCOPY N/A 06/06/2017   Procedure: ESOPHAGOGASTRODUODENOSCOPY (EGD);  Surgeon: Yetta Flock, MD;  Location: [facility];  Service: Gastroenterology;  Laterality: N/A;   ESOPHAGOGASTRODUODENOSCOPY (EGD) WITH PROPOFOL N/A 12/11/2014   Procedure: ESOPHAGOGASTRODUODENOSCOPY (EGD) WITH PROPOFOL;  Surgeon: Inda Castle, MD;  Location: [place];  Service: Endoscopy;  Laterality: N/A;   KNEE ARTHROSCOPY Right     Social History   Socioeconomic History   Marital status: Divorced    Spouse name: Not on file   Number of children: 0   Years of education: Not on file   Highest education level: Not on file  Occupational History   Occupation: Secondary school teacher  Tobacco Use   Smoking status: Former    Packs/day: 0.50    Years: 40.00    Pack years: 20.00    Types: Cigarettes    Quit date: 04/11/2014    Years since quitting: 7.3   Smokeless tobacco: Never  Vaping Use   Vaping Use: Never used  Substance and Sexual Activity   Alcohol use: Yes    Alcohol/week: 0.0 standard drinks    Comment: pint per week    Drug use: Yes    Comment: hx of 28 years ago - marijuana, acid, cocaine    Sexual activity: Yes  Other  Topics Concern   Not on file  Social History Narrative   Not on file   Social Determinants of Health   Financial Resource Strain: Not on file  Food Insecurity: Not on file  Transportation Needs: Not on file  Physical Activity: Not on file  Stress: Not on file  Social Connections: Not on file  Intimate Partner Violence: Not on file     Allergies  Allergen Reactions   Other Nausea And Vomiting    Narcotics- pt states he does not like to take narcotics d/t makes him sick     Outpatient Medications Prior to Visit  Medication Sig Dispense Refill   albuterol (PROVENTIL) (2.5 MG/3ML) 0.083% nebulizer solution Take 3 mLs (2.5 mg total) by nebulization every 6 (six) hours as needed  for wheezing or shortness of breath. 75 mL 12   Chlorphen-Phenyleph-ASA (ALKA-SELTZER PLUS COLD) 2-7.8-325 MG TBEF Take 2 tablets by mouth 2 (two) times daily as needed (cold symptoms).     clotrimazole-betamethasone (LOTRISONE) cream Apply to affected area 2 times daily prn 45 g 0   cyclobenzaprine (FLEXERIL) 10 MG tablet Take 1 tablet (10 mg total) by mouth 3 (three) times daily as needed for muscle spasms. 10 tablet 0   dextromethorphan (DELSYM) 30 MG/5ML liquid Take 2.5 mLs (15 mg total) by mouth as needed for cough. 89 mL 0   doxycycline (VIBRAMYCIN) 100 MG capsule Take 1 capsule (100 mg total) by mouth 2 (two) times daily. 20 capsule 0   guaiFENesin (MUCINEX) 600 MG 12 hr tablet Take 1 tablet (600 mg total) by mouth 2 (two) times daily. 20 tablet 0   ibuprofen (ADVIL) 800 MG tablet Take 1 tablet (800 mg total) by mouth 3 (three) times daily. 21 tablet 0   No facility-administered medications prior to visit.    Review of Systems  Constitutional:  Negative for chills, fever, malaise/fatigue and weight loss.  HENT:  Negative for hearing loss, sore throat and tinnitus.   Eyes:  Negative for blurred vision and double vision.  Respiratory:  Positive for cough, sputum production, shortness of breath and wheezing. Negative for hemoptysis and stridor.   Cardiovascular:  Negative for chest pain, palpitations, orthopnea, leg swelling and PND.  Gastrointestinal:  Negative for abdominal pain, constipation, diarrhea, heartburn, nausea and vomiting.  Genitourinary:  Negative for dysuria, hematuria and urgency.  Musculoskeletal:  Negative for joint pain and myalgias.  Skin:  Negative for itching and rash.  Neurological:  Negative for dizziness, tingling, weakness and headaches.  Endo/Heme/Allergies:  Negative for environmental allergies. Does not bruise/bleed easily.  Psychiatric/Behavioral:  Negative for depression. The patient is not nervous/anxious and does not have insomnia.   All other systems  reviewed and are negative.   Objective:  Physical Exam Vitals reviewed.  Constitutional:      General: He is not in acute distress.    Appearance: He is well-developed.  HENT:     Head: Normocephalic and atraumatic.  Eyes:     General: No scleral icterus.    Conjunctiva/sclera: Conjunctivae normal.     Pupils: Pupils are equal, round, and reactive to light.  Neck:     Vascular: No JVD.     Trachea: No tracheal deviation.  Cardiovascular:     Rate and Rhythm: Normal rate and regular rhythm.     Heart sounds: Normal heart sounds. No murmur heard. Pulmonary:     Effort: Pulmonary effort is normal. No tachypnea, accessory muscle usage or respiratory distress.     Breath  sounds: No stridor. Wheezing, rhonchi and rales present.  Abdominal:     General: There is no distension.     Palpations: Abdomen is soft.     Tenderness: There is no abdominal tenderness.  Musculoskeletal:        General: No tenderness.     Cervical back: Neck supple.  Lymphadenopathy:     Cervical: No cervical adenopathy.  Skin:    General: Skin is warm and dry.     Capillary Refill: Capillary refill takes less than 2 seconds.     Findings: No rash.  Neurological:     Mental Status: He is alert and oriented to person, place, and time.  Psychiatric:        Behavior: Behavior normal.     Vitals:   07/28/21 1556  BP: 132/70  Pulse: (!) 112  Temp: 98.2 F (36.8 C)  TempSrc: Oral  SpO2: 94%  Weight: 187 lb 3.2 oz (84.9 kg)  Height: 5\' 7"  (1.702 m)   94% RA BMI Readings from Last 3 Encounters:  07/28/21 29.32 kg/m  07/15/21 29.13 kg/m  07/18/19 26.14 kg/m   Wt Readings from Last 3 Encounters:  07/28/21 187 lb 3.2 oz (84.9 kg)  07/15/21 186 lb (84.4 kg)  07/18/19 166 lb 14.2 oz (75.7 kg)     CBC    Component Value Date/Time   WBC 4.8 07/15/2021 0658   RBC 5.15 07/15/2021 0658   HGB 15.7 07/15/2021 0658   HCT 48.2 07/15/2021 0658   PLT 202 07/15/2021 0658   MCV 93.6 07/15/2021 0658    MCH 30.5 07/15/2021 0658   MCHC 32.6 07/15/2021 0658   RDW 13.2 07/15/2021 0658   LYMPHSABS 1.7 07/04/2021 1425   MONOABS 0.7 07/04/2021 1425   EOSABS 0.7 (H) 07/04/2021 1425   BASOSABS 0.1 07/04/2021 1425    Chest Imaging: 11/17 and 07/04/2021 chest x-ray Left upper lobe nodular opacity  November 2020: CT chest: Multiple patchy peripheral infiltrates consistent with COVID-19. The patient's images have been independently reviewed by me.    Pulmonary Functions Testing Results: No flowsheet data found.  FeNO:   Pathology:   Echocardiogram:   Heart Catheterization:     Assessment & Plan:     ICD-10-CM   1. COPD with acute exacerbation ([place])  J44.1     2. Lung nodule seen on imaging study  R91.1 CT CHEST HIGH RESOLUTION    Pulmonary Function Test    3. Former smoker  Z87.891 CT CHEST HIGH RESOLUTION    Pulmonary Function Test    predniSONE (DELTASONE) 10 MG tablet    albuterol (PROVENTIL) (2.5 MG/3ML) 0.083% nebulizer solution    Budeson-Glycopyrrol-Formoterol (BREZTRI AEROSPHERE) 160-9-4.8 MCG/ACT AERO    4. History of COVID-19  Z86.16       Discussion:  [name], history of COVID-19, former smoker, chest x-ray taken during a recent ER visit for acute exacerbation of COPD which she is still recovering from has ongoing shortness of breath, wheezing chest tightness.  He is completing a course of steroids plus doxycycline.  He is not on any maintenance inhalers.  Plan: For his left upper lobe nodular opacity we need a CT scan. We will have a HRCT of the chest ordered for evaluation due to his history of COVID-19 we will look to see if there is any other parenchymal change. As for his exacerbation we will give him a intramuscular injection of 80 mg Depo-Medrol followed by a prednisone taper Breztri samples today,  new prescription, new nebulizer machine and supplies albuterol solution PFTs and HRCT complete prior to next office visit. RTC in 4  weeks.   Current Outpatient Medications:    albuterol (PROVENTIL) (2.5 MG/3ML) 0.083% nebulizer solution, Take 3 mLs (2.5 mg total) by nebulization every 6 (six) hours as needed for wheezing or shortness of breath., Disp: 75 mL, Rfl: 12   albuterol (PROVENTIL) (2.5 MG/3ML) 0.083% nebulizer solution, Take 3 mLs (2.5 mg total) by nebulization every 6 (six) hours as needed for wheezing or shortness of breath., Disp: 75 mL, Rfl: 12   Budeson-Glycopyrrol-Formoterol (BREZTRI AEROSPHERE) 160-9-4.8 MCG/ACT AERO, Inhale 2 puffs into the lungs in the morning and at bedtime., Disp: 10.7 g, Rfl: 6   Chlorphen-Phenyleph-ASA (ALKA-SELTZER PLUS COLD) 2-7.8-325 MG TBEF, Take 2 tablets by mouth 2 (two) times daily as needed (cold symptoms)., Disp: , Rfl:    clotrimazole-betamethasone (LOTRISONE) cream, Apply to affected area 2 times daily prn, Disp: 45 g, Rfl: 0   cyclobenzaprine (FLEXERIL) 10 MG tablet, Take 1 tablet (10 mg total) by mouth 3 (three) times daily as needed for muscle spasms., Disp: 10 tablet, Rfl: 0   dextromethorphan (DELSYM) 30 MG/5ML liquid, Take 2.5 mLs (15 mg total) by mouth as needed for cough., Disp: 89 mL, Rfl: 0   doxycycline (VIBRAMYCIN) 100 MG capsule, Take 1 capsule (100 mg total) by mouth 2 (two) times daily., Disp: 20 capsule, Rfl: 0   guaiFENesin (MUCINEX) 600 MG 12 hr tablet, Take 1 tablet (600 mg total) by mouth 2 (two) times daily., Disp: 20 tablet, Rfl: 0   ibuprofen (ADVIL) 800 MG tablet, Take 1 tablet (800 mg total) by mouth 3 (three) times daily., Disp: 21 tablet, Rfl: 0   predniSONE (DELTASONE) 10 MG tablet, Take 4 tabs by mouth once daily x4 days, then 3 tabs x4 days, 2 tabs x4 days, 1 tab x4 days and stop., Disp: 40 tablet, Rfl: 0   Garner Nash, DO [place] 07/28/2021 4:24 PM

## 2021-07-29 ENCOUNTER — Telehealth: Payer: Self-pay | Admitting: Pulmonary Disease

## 2021-07-29 NOTE — Telephone Encounter (Signed)
Pt seen in clinic with Icard on 07/28/21 (COPD and lung nodule)- PFT and 4 wk ov requested and pt left without scheduling, told nurse he would call us to set this up.  PFT order already placed. I called pt to schedule and he did not answer. I have left him msg to call back to schedule appts. PFT any time before next visit. Forwarding to the front desk pool for f/u. Thanks!

## 2021-08-10 ENCOUNTER — Telehealth: Payer: Self-pay | Admitting: Pulmonary Disease

## 2021-08-10 DIAGNOSIS — J441 Chronic obstructive pulmonary disease with (acute) exacerbation: Secondary | ICD-10-CM

## 2021-08-10 NOTE — Telephone Encounter (Signed)
I called the patient and let him know that I have sent in an order for the nebulizer.  He is going to call [place] to check on patient assistance for his medical bills. Nothing further needed.

## 2021-08-11 ENCOUNTER — Telehealth: Payer: Self-pay | Admitting: Pulmonary Disease

## 2021-08-11 MED ORDER — AZITHROMYCIN 250 MG PO TABS: 250.0000 mg | ORAL_TABLET | ORAL | 0 refills | Status: DC

## 2021-08-11 NOTE — Telephone Encounter (Signed)
Bertis Ruddy; Katherina Right, Mike Craze Hello! I called Mr. Candelas about the neb. He was telling me since he doesn't have insurance, you guys were going to get him one and he would pay nothing for it?   Not sure what this is about

## 2021-08-11 NOTE — Telephone Encounter (Signed)
I spoke with the pt and confirmed pharm. I made him aware of response per Dr Valeta Harms. He verbalized understanding. Nothing further needed. Rx sent.

## 2021-08-11 NOTE — Telephone Encounter (Signed)
Icard, Octavio Graves, DO  Rosana Berger, CMA; [facility] Triage Pool Caller: Unspecified Wilburn Mylar,  3:58 PM)  Azithromycin z-pack X 5 days  He needs to keep his appt scheduled in Jan  If he gets worse or he calls the office again he must be seen   Garner Nash, DO  [facility]  08/11/2021 1:10 PM   Tried calling the pt and there was no answer- [facility] verified and can send pending rx for zpack

## 2021-08-11 NOTE — Telephone Encounter (Signed)
Spoke with the pt  He states that ever since ov here 07/28/21 he has continued to cough up [place] sputum  He is not having any increased SOB, but is wheezing some and states feels he needs abx  He finished round of doxy approx 10 days ago and feels that it never helped  He denies fevers or aches  States he is using his breztri samples but can not afford to purchase breztri bc it costs over $600  He is still awaiting neb machine from [employer] but has neb sol  I offered ov and he declined, stating he prefers to keep the ov with pft he is scheduled for 09/11/21   Dr Valeta Harms, please advise, thanks!  Allergies  Allergen Reactions   Other Nausea And Vomiting    Narcotics- pt states he does not like to take narcotics d/t makes him sick

## 2021-08-13 NOTE — Telephone Encounter (Signed)
Spoke to patient, who stated that it was mentioned during his last OV that he would receive a nebulizer machine at no cost since he is uninsured.  [place] nor myself are not aware of patient assistance for nebulizer machine.  Dr. Valeta Harms, please advise. thanks

## 2021-08-13 NOTE — Telephone Encounter (Signed)
Spoke to patient and relayed below message. He voiced his understanding.  Patient does not wish to be seen in [place] office. It appears that appt was made in error.  Appt canceled.  Nothing further needed at this time.   Routing to Dr. Valeta Harms as an Juluis Rainier.

## 2021-08-19 ENCOUNTER — Ambulatory Visit
Admission: RE | Admit: 2021-08-19 | Discharge: 2021-08-19 | Disposition: A | Discharge status: Home / Self Care | Payer: Self-pay | Source: Ambulatory Visit | Attending: Pulmonary Disease | Admitting: Pulmonary Disease

## 2021-08-19 DIAGNOSIS — R911 Solitary pulmonary nodule: Secondary | ICD-10-CM

## 2021-08-19 DIAGNOSIS — Z87891 Personal history of nicotine dependence: Secondary | ICD-10-CM

## 2021-09-02 ENCOUNTER — Telehealth: Payer: Self-pay | Admitting: Pulmonary Disease

## 2021-09-02 ENCOUNTER — Ambulatory Visit: Payer: Medicaid Other | Admitting: Pulmonary Disease

## 2021-09-02 NOTE — Telephone Encounter (Signed)
Spoke to patient, who is requesting CT results from 08/20/2021.  Dr. Valeta Harms, please advise. Thanks

## 2021-09-06 NOTE — Telephone Encounter (Signed)
Call made to patient, confirmed DOB. Made aware of CT results. Patient voices he will bring all his questions to his appt on 1/13.   Nothing further needed at this time.

## 2021-09-07 ENCOUNTER — Telehealth: Payer: Medicaid Other | Admitting: Physician Assistant

## 2021-09-07 DIAGNOSIS — J208 Acute bronchitis due to other specified organisms: Secondary | ICD-10-CM

## 2021-09-07 MED ORDER — PREDNISONE 20 MG PO TABS: 40.0000 mg | ORAL_TABLET | Freq: Every day | ORAL | 0 refills | Status: DC

## 2021-09-07 MED ORDER — BENZONATATE 100 MG PO CAPS
100.0000 mg | ORAL_CAPSULE | Freq: Three times a day (TID) | ORAL | 0 refills | Status: DC | PRN
Start: 2021-09-07 — End: 2022-09-20

## 2021-09-07 NOTE — Progress Notes (Signed)
We are sorry that you are not feeling well.  Here is how we plan to help!  Based on your presentation I believe you most likely have A cough due to a virus.  This is called viral bronchitis and is best treated by rest, plenty of fluids and control of the cough.  You may use Ibuprofen or Tylenol as directed to help your symptoms.     In addition you may use A prescription cough medication called Tessalon Perles 100mg . You may take 1-2 capsules every 8 hours as needed for your cough. I have also sent in a 5-day burst of steroid (prednisone) to take as directed.   From your responses in the eVisit questionnaire you describe inflammation in the upper respiratory tract which is causing a significant cough.  This is commonly called Bronchitis and has four common causes:   Allergies Viral Infections Acid Reflux Bacterial Infection Allergies, viruses and acid reflux are treated by controlling symptoms or eliminating the cause. An example might be a cough caused by taking certain blood pressure medications. You stop the cough by changing the medication. Another example might be a cough caused by acid reflux. Controlling the reflux helps control the cough.  USE OF BRONCHODILATOR ("RESCUE") INHALERS: There is a risk from using your bronchodilator too frequently.  The risk is that over-reliance on a medication which only relaxes the muscles surrounding the breathing tubes can reduce the effectiveness of medications prescribed to reduce swelling and congestion of the tubes themselves.  Although you feel brief relief from the bronchodilator inhaler, your asthma may actually be worsening with the tubes becoming more swollen and filled with mucus.  This can delay other crucial treatments, such as oral steroid medications. If you need to use a bronchodilator inhaler daily, several times per day, you should discuss this with your provider.  There are probably better treatments that could be used to keep your asthma  under control.     HOME CARE Only take medications as instructed by your medical team. Complete the entire course of an antibiotic. Drink plenty of fluids and get plenty of rest. Avoid close contacts especially the very young and the elderly Cover your mouth if you cough or cough into your sleeve. Always remember to wash your hands A steam or ultrasonic humidifier can help congestion.   GET HELP RIGHT AWAY IF: You develop worsening fever. You become short of breath You cough up blood. Your symptoms persist after you have completed your treatment plan MAKE SURE YOU  Understand these instructions. Will watch your condition. Will get help right away if you are not doing well or get worse.    Thank you for choosing an e-visit.  Your e-visit answers were reviewed by a board certified advanced clinical practitioner to complete your personal care plan. Depending upon the condition, your plan could have included both over the counter or prescription medications.  Please review your pharmacy choice. Make sure the pharmacy is open so you can pick up prescription now. If there is a problem, you may contact your provider through [employer] and have the prescription routed to another pharmacy.  Your safety is important to [place]. If you have drug allergies check your prescription carefully.   For the next 24 hours you can use MyChart to ask questions about today's visit, request a non-urgent call back, or ask for a work or school excuse. You will get an email in the next two days asking about your experience. I hope that  your e-visit has been valuable and will speed your recovery.

## 2021-09-07 NOTE — Progress Notes (Signed)
I have spent 5 minutes in review of e-visit questionnaire, review and updating patient chart, medical decision making and response to patient.   Osher Oettinger Cody Silas Sedam, PA-C    

## 2021-09-08 ENCOUNTER — Ambulatory Visit: Payer: Self-pay | Attending: Family Medicine | Admitting: Family Medicine

## 2021-09-08 ENCOUNTER — Other Ambulatory Visit: Payer: Self-pay

## 2021-09-08 ENCOUNTER — Encounter: Payer: Self-pay | Admitting: Family Medicine

## 2021-09-08 DIAGNOSIS — J449 Chronic obstructive pulmonary disease, unspecified: Secondary | ICD-10-CM

## 2021-09-08 DIAGNOSIS — S2232XA Fracture of one rib, left side, initial encounter for closed fracture: Secondary | ICD-10-CM

## 2021-09-08 MED ORDER — CYCLOBENZAPRINE HCL 10 MG PO TABS: 10.0000 mg | ORAL_TABLET | Freq: Two times a day (BID) | ORAL | 0 refills | Status: DC | PRN

## 2021-09-08 MED ORDER — LIDOCAINE 5 % EX PTCH: 1.0000 | MEDICATED_PATCH | CUTANEOUS | 1 refills | Status: DC

## 2021-09-08 NOTE — Progress Notes (Signed)
Virtual Visit via Video Note  I connected with Dylan [name], on 09/08/2021 at 1:57 PM by video enabled telemedicine device due to the COVID-19 pandemic and verified that I am speaking with the correct person using two identifiers.   Consent: I discussed the limitations, risks, security and privacy concerns of performing an evaluation and management service by telemedicine and the availability of in person appointments. I also discussed with the patient that there may be a patient responsible charge related to this service. The patient expressed understanding and agreed to proceed.   Location of Patient: Home  Location of Provider: Clinic   Persons participating in Telemedicine visit: Dylan [name] Dr. Margarita Rana     History of Present Illness: Dylan [name] is a 61 y.o. year old male  with history of COPD who presents today to establish care.    He sees [facility] -Dr Valeta Harms. He does have pain in his L chestwall which feels like something is grabbing him and pulling on him. Pain is sharp and worse when he twists. Pain is sometimes a 10/10 when severe and sometimes radiates to his left scapula He is wheezing and coughing up mucus. His Pulmonologist prescribed Prednisone and an antitussive and it is not working.  His inhalers have also been ineffective.  He sometimes has post nasal drip but states it is not in his sinuses. He also states he had nodules in his esophagus in the past. Last Pulmonary visit was in 06/2021. CT chest revealed: IMPRESSION: 1. Subtle/mild subpleural ground-glass in the periphery of both upper lobes may represent the sequelae of COVID-19 pneumonia. Findings are suggestive of an alternative diagnosis (not UIP) per consensus guidelines: Diagnosis of Idiopathic Pulmonary Fibrosis: An Official ATS/ERS/JRS/ALAT Clinical Practice Guideline. [place], Iss 5, [phone], Apr 29 2017. 2. Expansile area of lucency in the lateral left  fourth rib with an associated healed fracture, new from 07/16/2019. Difficult to exclude a pathologic fracture. Clinical correlation for primary malignancy should be considered. 3. Cirrhosis. 4.  Emphysema (ICD10-J43.9).     He informs me he had no trauma to elicit the left fourth rib fracture and does not think he coughs as hard as to cause acute rib fracture.  Denies presence of hypersensitivity on left chest wall and has no presence of rash. Past Medical History:  Diagnosis Date   Cholelithiasis    Cirrhosis ([place])    COPD (chronic obstructive pulmonary disease) (HCC)    1 year   Gallstones    Hepatitis C    3 years   PONV (postoperative nausea and vomiting)    Smoker    1/2 ppd for 40 years   Allergies  Allergen Reactions   Other Nausea And Vomiting    Narcotics- pt states he does not like to take narcotics d/t makes him sick    Current Outpatient Medications on File Prior to Visit  Medication Sig Dispense Refill   albuterol (PROVENTIL) (2.5 MG/3ML) 0.083% nebulizer solution Take 3 mLs (2.5 mg total) by nebulization every 6 (six) hours as needed for wheezing or shortness of breath. 75 mL 12   albuterol (PROVENTIL) (2.5 MG/3ML) 0.083% nebulizer solution Take 3 mLs (2.5 mg total) by nebulization every 6 (six) hours as needed for wheezing or shortness of breath. 75 mL 12   benzonatate (TESSALON) 100 MG capsule Take 1 capsule (100 mg total) by mouth 3 (three) times daily as needed for cough. 30 capsule 0  Budeson-Glycopyrrol-Formoterol (BREZTRI AEROSPHERE) 160-9-4.8 MCG/ACT AERO Inhale 2 puffs into the lungs in the morning and at bedtime. 10.7 g 6   Chlorphen-Phenyleph-ASA (ALKA-SELTZER PLUS COLD) 2-7.8-325 MG TBEF Take 2 tablets by mouth 2 (two) times daily as needed (cold symptoms).     clotrimazole-betamethasone (LOTRISONE) cream Apply to affected area 2 times daily prn 45 g 0   cyclobenzaprine (FLEXERIL) 10 MG tablet Take 1 tablet (10 mg total) by mouth 3 (three) times daily  as needed for muscle spasms. 10 tablet 0   guaiFENesin (MUCINEX) 600 MG 12 hr tablet Take 1 tablet (600 mg total) by mouth 2 (two) times daily. 20 tablet 0   ibuprofen (ADVIL) 800 MG tablet Take 1 tablet (800 mg total) by mouth 3 (three) times daily. 21 tablet 0   predniSONE (DELTASONE) 20 MG tablet Take 2 tablets (40 mg total) by mouth daily with breakfast. 10 tablet 0   No current facility-administered medications on file prior to visit.    ROS: See HPI  Observations/Objective: General-awake, alert, oriented x3 Neck-no JVD Respiration-normal, not in respiratory distress Chest-tenderness to palpation on left inframammary region Skin-no rash noted in area of pain.  CMP Latest Ref Rng & Units 07/15/2021 07/04/2021 04/04/2021  Glucose 70 - 99 mg/dL 110(H) 89 92  BUN 8 - 23 mg/dL 10 14 12   Creatinine 0.61 - 1.24 mg/dL 1.11 1.25(H) 1.20  Sodium 135 - 145 mmol/L 139 139 139  Potassium 3.5 - 5.1 mmol/L 3.8 4.3 3.9  Chloride 98 - 111 mmol/L 104 106 107  CO2 22 - 32 mmol/L 27 25 24   Calcium 8.9 - 10.3 mg/dL 8.7(L) 9.2 9.1  Total Protein 6.5 - 8.1 g/dL - - 7.7  Total Bilirubin 0.3 - 1.2 mg/dL - - 1.3(H)  Alkaline Phos 38 - 126 U/L - - 74  AST 15 - 41 U/L - - 38  ALT 0 - 44 U/L - - 37    Lipid Panel     Component Value Date/Time   CHOL 197 04/21/2014 1546   TRIG 94 07/17/2019 0416   HDL 68 04/21/2014 1546   CHOLHDL 2.9 04/21/2014 1546   VLDL 18 04/21/2014 1546   LDLCALC 111 (H) 04/21/2014 1546    No results found for: HGBA1C   Assessment and Plan: 1. COPD, severity to be determined ([place]) Uncontrolled with recurrent exacerbations Currently on Breztri and MDI Most recent CT with sequelae of COVID-19 pneumonia and left fourth rib fracture Recently completed course of prednisone but he is yet to pick up his antitussive He does have an upcoming appointment with his pulmonologist and I have advised him to discuss symptoms with him.   2. Closed fracture of one rib of left side,  initial encounter Uncontrolled Possible etiology includes severe coughing, malignancy will also need to be ruled out Advised that management of rib fractures is conservative Prescribed Lidoderm patch and Flexeril - cyclobenzaprine (FLEXERIL) 10 MG tablet; Take 1 tablet (10 mg total) by mouth 2 (two) times daily between meals as needed for muscle spasms.  Dispense: 30 tablet; Refill: 0 - lidocaine (LIDODERM) 5 %; Place 1 patch onto the skin daily. Remove & Discard patch within 12 hours or as directed by MD  Dispense: 30 patch; Refill: 1   Follow Up Instructions: 3 months to establish care   I discussed the assessment and treatment plan with the patient. The patient was provided an opportunity to ask questions and all were answered. The patient agreed with the plan and demonstrated an  understanding of the instructions.   The patient was advised to call back or seek an in-person evaluation if the symptoms worsen or if the condition fails to improve as anticipated.     I provided 17 minutes total of Telehealth time during this encounter including median intraservice time, reviewing previous notes, investigations, ordering medications, medical decision making, coordinating care and patient verbalized understanding at the end of the visit.     Charlott Rakes, MD, FAAFP. [facility] [place], [place]   09/08/2021, 1:57 PM

## 2021-09-10 ENCOUNTER — Other Ambulatory Visit: Payer: Self-pay

## 2021-09-10 ENCOUNTER — Ambulatory Visit: Payer: Medicaid Other | Admitting: Pulmonary Disease

## 2021-09-10 ENCOUNTER — Ambulatory Visit: Payer: Self-pay

## 2021-10-12 ENCOUNTER — Other Ambulatory Visit: Payer: Self-pay | Admitting: Pulmonary Disease

## 2021-10-13 LAB — SARS CORONAVIRUS 2 (TAT 6-24 HRS): SARS Coronavirus 2: NEGATIVE

## 2021-10-15 ENCOUNTER — Encounter: Payer: Self-pay | Admitting: Pulmonary Disease

## 2021-10-15 ENCOUNTER — Ambulatory Visit (INDEPENDENT_AMBULATORY_CARE_PROVIDER_SITE_OTHER): Payer: Self-pay | Admitting: Pulmonary Disease

## 2021-10-15 ENCOUNTER — Other Ambulatory Visit: Payer: Self-pay

## 2021-10-15 VITALS — BP 124/86 | HR 83 | Temp 98.0°F | Ht 67.0 in | Wt 188.0 lb

## 2021-10-15 DIAGNOSIS — Z87891 Personal history of nicotine dependence: Secondary | ICD-10-CM

## 2021-10-15 DIAGNOSIS — J432 Centrilobular emphysema: Secondary | ICD-10-CM

## 2021-10-15 DIAGNOSIS — M899 Disorder of bone, unspecified: Secondary | ICD-10-CM

## 2021-10-15 DIAGNOSIS — R062 Wheezing: Secondary | ICD-10-CM

## 2021-10-15 DIAGNOSIS — R911 Solitary pulmonary nodule: Secondary | ICD-10-CM

## 2021-10-15 DIAGNOSIS — J439 Emphysema, unspecified: Secondary | ICD-10-CM

## 2021-10-15 DIAGNOSIS — J984 Other disorders of lung: Secondary | ICD-10-CM

## 2021-10-15 DIAGNOSIS — J4540 Moderate persistent asthma, uncomplicated: Secondary | ICD-10-CM

## 2021-10-15 LAB — PULMONARY FUNCTION TEST
DL/VA % pred: 95 %
DL/VA: 4.07 ml/min/mmHg/L
DLCO cor % pred: 75 %
DLCO cor: 18.69 ml/min/mmHg
DLCO unc % pred: 75 %
DLCO unc: 18.69 ml/min/mmHg
FEF 25-75 Post: 2.17 L/sec
FEF 25-75 Pre: 1.45 L/sec
FEF2575-%Change-Post: 49 %
FEF2575-%Pred-Post: 85 %
FEF2575-%Pred-Pre: 56 %
FEV1-%Change-Post: 9 %
FEV1-%Pred-Post: 73 %
FEV1-%Pred-Pre: 67 %
FEV1-Post: 2.01 L
FEV1-Pre: 1.84 L
FEV1FVC-%Change-Post: 10 %
FEV1FVC-%Pred-Pre: 96 %
FEV6-%Change-Post: 0 %
FEV6-%Pred-Post: 71 %
FEV6-%Pred-Pre: 72 %
FEV6-Post: 2.44 L
FEV6-Pre: 2.46 L
FEV6FVC-%Pred-Post: 104 %
FEV6FVC-%Pred-Pre: 104 %
FVC-%Change-Post: 0 %
FVC-%Pred-Post: 68 %
FVC-%Pred-Pre: 69 %
FVC-Post: 2.44 L
FVC-Pre: 2.46 L
Post FEV1/FVC ratio: 83 %
Post FEV6/FVC ratio: 100 %
Pre FEV1/FVC ratio: 75 %
Pre FEV6/FVC Ratio: 100 %
RV % pred: 104 %
RV: 2.21 L
TLC % pred: 77 %
TLC: 4.93 L

## 2021-10-15 NOTE — Patient Instructions (Signed)
Thank you for visiting Dr. Valeta Harms at [facility]. Today we recommend the following:  Breztri samples  [place] applications for financial assistance   Return in about 3 months (around 01/12/2022) for with APP or Dr. Valeta Harms.    Please do your part to reduce the spread of COVID-19.

## 2021-10-15 NOTE — Progress Notes (Signed)
Full PFT performed today. °

## 2021-10-15 NOTE — Progress Notes (Signed)
Synopsis: Referred in November 2022 for lung nodule by No ref. provider found  Subjective:   PATIENT ID: Dylan [name] GENDER: male DOB: [birth year], MRN: [id number]  Chief Complaint  Patient presents with   Follow-up    Patient is here to go over PFT results. Patient wants to talk about inhalers.     [name], history of COPD, hepatitis C, smoker, cirrhosis.  Presents to the office today for evaluation of abnormal CT imaging.  He is a former smoker quit couple years ago.Patient recently was admitted to the emergency department for a COPD exacerbation.  He was started on steroids.  He recently was given a prescription for doxycycline as well.  He is almost through with this.  Patient had a CT scan of the chest in 2020 that did not have any significant nodular findings however at the time he had COVID-19 and had significant multifocal inflammatory areas within the chest.  His chest x-ray completed on 07/15/2021 revealed a nodular opacity within the left upper lobe that was also present on a chest x-ray, 07/04/2021.  I reviewed the images today with the patient in the office.  OV 10/15/2021: Here today for follow-up regarding COPD.  Patient was seen initially after an abnormal chest x-ray that revealed a upper lobe opacity.Patient had a subsequent CT image of the chest in December 2022 she had a lucency within the left rib at that time with an associated healed fracture.  Also found to have subtle subpleural groundglass opacities within the periphery of both lungs suggestive of his prior COVID-19 diagnosis.  Patient responded well to the [facility] samples that he was given last time however was unable to afford them to the pharmacy.  So he started using his ex-wife's nebulizer.  He has PFTs completed prior to today's office with normal ratio yet has reduced FEV1 and FVC also has a mildly reduced TLC and a mildly reduced DLCO.  Consistent with a mixed obstructive and restrictive  pattern with his history of smoking plus COVID-19 pneumonia.Marland Kitchen  His biggest complaint is nocturnal wheezing.  Has shortness of breath with exertion.   Past Medical History:  Diagnosis Date   Cholelithiasis    Cirrhosis ([place])    COPD (chronic obstructive pulmonary disease) (HCC)    1 year   Gallstones    Hepatitis C    3 years   PONV (postoperative nausea and vomiting)    Smoker    1/2 ppd for 76 years     Family History  Problem Relation Age of Onset   Diabetes Mother    Alzheimer's disease Mother    Diabetes Maternal Grandmother    Mesothelioma Father    Colon cancer Neg Hx    Stomach cancer Neg Hx      Past Surgical History:  Procedure Laterality Date   COLONOSCOPY WITH PROPOFOL N/A 12/11/2014   Procedure: COLONOSCOPY WITH PROPOFOL;  Surgeon: Inda Castle, MD;  Location[facility] ENDOSCOPY;  Service: Endoscopy;  Laterality: N/A;   ESOPHAGEAL BANDING N/A 12/11/2014   Procedure: ESOPHAGEAL BANDING;  Surgeon: Inda Castle, MD;  Location: [place];  Service: Endoscopy;  Laterality: N/A;   ESOPHAGOGASTRODUODENOSCOPY N/A 06/06/2017   Procedure: ESOPHAGOGASTRODUODENOSCOPY (EGD);  Surgeon: Yetta Flock, MD;  Location: [facility] ENDOSCOPY;  Service: Gastroenterology;  Laterality: N/A;   ESOPHAGOGASTRODUODENOSCOPY (EGD) WITH PROPOFOL N/A 12/11/2014   Procedure: ESOPHAGOGASTRODUODENOSCOPY (EGD) WITH PROPOFOL;  Surgeon: Inda Castle, MD;  Location: [place];  Service: Endoscopy;  Laterality: N/A;  KNEE ARTHROSCOPY Right     Social History   Socioeconomic History   Marital status: Divorced    Spouse name: Not on file   Number of children: 0   Years of education: Not on file   Highest education level: Not on file  Occupational History   Occupation: Secondary school teacher  Tobacco Use   Smoking status: Former    Packs/day: 0.50    Years: 40.00    Pack years: 20.00    Types: Cigarettes    Quit date: 04/11/2014    Years since quitting: 7.5   Smokeless tobacco: Never   Vaping Use   Vaping Use: Never used  Substance and Sexual Activity   Alcohol use: Yes    Alcohol/week: 0.0 standard drinks    Comment: pint per week    Drug use: Yes    Comment: hx of 28 years ago - marijuana, acid, cocaine    Sexual activity: Yes  Other Topics Concern   Not on file  Social History Narrative   Not on file   Social Determinants of Health   Financial Resource Strain: Not on file  Food Insecurity: Not on file  Transportation Needs: Not on file  Physical Activity: Not on file  Stress: Not on file  Social Connections: Not on file  Intimate Partner Violence: Not on file     Allergies  Allergen Reactions   Other Nausea And Vomiting    Narcotics- pt states he does not like to take narcotics d/t makes him sick     Outpatient Medications Prior to Visit  Medication Sig Dispense Refill   albuterol (PROVENTIL) (2.5 MG/3ML) 0.083% nebulizer solution Take 3 mLs (2.5 mg total) by nebulization every 6 (six) hours as needed for wheezing or shortness of breath. 75 mL 12   albuterol (PROVENTIL) (2.5 MG/3ML) 0.083% nebulizer solution Take 3 mLs (2.5 mg total) by nebulization every 6 (six) hours as needed for wheezing or shortness of breath. 75 mL 12   benzonatate (TESSALON) 100 MG capsule Take 1 capsule (100 mg total) by mouth 3 (three) times daily as needed for cough. 30 capsule 0   Budeson-Glycopyrrol-Formoterol (BREZTRI AEROSPHERE) 160-9-4.8 MCG/ACT AERO Inhale 2 puffs into the lungs in the morning and at bedtime. 10.7 g 6   Chlorphen-Phenyleph-ASA (ALKA-SELTZER PLUS COLD) 2-7.8-325 MG TBEF Take 2 tablets by mouth 2 (two) times daily as needed (cold symptoms).     clotrimazole-betamethasone (LOTRISONE) cream Apply to affected area 2 times daily prn 45 g 0   cyclobenzaprine (FLEXERIL) 10 MG tablet Take 1 tablet (10 mg total) by mouth 2 (two) times daily between meals as needed for muscle spasms. 30 tablet 0   guaiFENesin (MUCINEX) 600 MG 12 hr tablet Take 1 tablet (600 mg  total) by mouth 2 (two) times daily. 20 tablet 0   ibuprofen (ADVIL) 800 MG tablet Take 1 tablet (800 mg total) by mouth 3 (three) times daily. 21 tablet 0   lidocaine (LIDODERM) 5 % Place 1 patch onto the skin daily. Remove & Discard patch within 12 hours or as directed by MD 30 patch 1   predniSONE (DELTASONE) 20 MG tablet Take 2 tablets (40 mg total) by mouth daily with breakfast. 10 tablet 0   No facility-administered medications prior to visit.    Review of Systems  Constitutional:  Negative for chills, fever, malaise/fatigue and weight loss.  HENT:  Negative for hearing loss, sore throat and tinnitus.   Eyes:  Negative for blurred vision and double vision.  Respiratory:  Positive for cough, shortness of breath and wheezing. Negative for hemoptysis, sputum production and stridor.   Cardiovascular:  Negative for chest pain, palpitations, orthopnea, leg swelling and PND.  Gastrointestinal:  Negative for abdominal pain, constipation, diarrhea, heartburn, nausea and vomiting.  Genitourinary:  Negative for dysuria, hematuria and urgency.  Musculoskeletal:  Negative for joint pain and myalgias.  Skin:  Negative for itching and rash.  Neurological:  Negative for dizziness, tingling, weakness and headaches.  Endo/Heme/Allergies:  Negative for environmental allergies. Does not bruise/bleed easily.  Psychiatric/Behavioral:  Negative for depression. The patient is not nervous/anxious and does not have insomnia.   All other systems reviewed and are negative.   Objective:  Physical Exam Vitals reviewed.  Constitutional:      General: He is not in acute distress.    Appearance: He is well-developed.  HENT:     Head: Normocephalic and atraumatic.  Eyes:     General: No scleral icterus.    Conjunctiva/sclera: Conjunctivae normal.     Pupils: Pupils are equal, round, and reactive to light.  Neck:     Vascular: No JVD.     Trachea: No tracheal deviation.  Cardiovascular:     Rate and  Rhythm: Normal rate and regular rhythm.     Heart sounds: Normal heart sounds. No murmur heard. Pulmonary:     Effort: Pulmonary effort is normal. No tachypnea, accessory muscle usage or respiratory distress.     Breath sounds: No stridor. Wheezing present. No rhonchi or rales.     Comments: Scattered bilateral faint expiratory wheezing Abdominal:     General: Bowel sounds are normal. There is no distension.     Palpations: Abdomen is soft.     Tenderness: There is no abdominal tenderness.  Musculoskeletal:        General: No tenderness.     Cervical back: Neck supple.  Lymphadenopathy:     Cervical: No cervical adenopathy.  Skin:    General: Skin is warm and dry.     Capillary Refill: Capillary refill takes less than 2 seconds.     Findings: No rash.  Neurological:     Mental Status: He is alert and oriented to person, place, and time.  Psychiatric:        Behavior: Behavior normal.     Vitals:   10/15/21 1452  BP: 124/86  Pulse: 83  Temp: 98 F (36.7 C)  TempSrc: Oral  SpO2: 96%  Weight: 188 lb (85.3 kg)  Height: 5\' 7"  (1.702 m)   96% RA BMI Readings from Last 3 Encounters:  10/15/21 29.44 kg/m  07/28/21 29.32 kg/m  07/15/21 29.13 kg/m   Wt Readings from Last 3 Encounters:  10/15/21 188 lb (85.3 kg)  07/28/21 187 lb 3.2 oz (84.9 kg)  07/15/21 186 lb (84.4 kg)     CBC    Component Value Date/Time   WBC 4.8 07/15/2021 0658   RBC 5.15 07/15/2021 0658   HGB 15.7 07/15/2021 0658   HCT 48.2 07/15/2021 0658   PLT 202 07/15/2021 0658   MCV 93.6 07/15/2021 0658   MCH 30.5 07/15/2021 0658   MCHC 32.6 07/15/2021 0658   RDW 13.2 07/15/2021 0658   LYMPHSABS 1.7 07/04/2021 1425   MONOABS 0.7 07/04/2021 1425   EOSABS 0.7 (H) 07/04/2021 1425   BASOSABS 0.1 07/04/2021 1425    Chest Imaging: 11/17 and 07/04/2021 chest x-ray Left upper lobe nodular opacity  November 2020: CT chest: Multiple patchy peripheral infiltrates consistent with COVID-19. The  patient's images have been independently reviewed by me.    08/19/2021 high-resolution CT scan of the chest: Subtle findings within the lung parenchyma suggestive of COVID-19 pneumonia. Additionally he has a rib lesion that was seen on the CT scan that I think we need to follow closely. The patient's images have been independently reviewed by me.     Pulmonary Functions Testing Results: PFT Results Latest Ref Rng & Units 09/10/2021  FVC-Pre L 2.46  FVC-Predicted Pre % 69  FVC-Post L 2.44  FVC-Predicted Post % 68  Pre FEV1/FVC % % 75  Post FEV1/FCV % % 83  FEV1-Pre L 1.84  FEV1-Predicted Pre % 67  FEV1-Post L 2.01  DLCO uncorrected ml/min/mmHg 18.69  DLCO UNC% % 75  DLCO corrected ml/min/mmHg 18.69  DLCO COR %Predicted % 75  DLVA Predicted % 95  TLC L 4.93  TLC % Predicted % 77  RV % Predicted % 104    FeNO:   Pathology:   Echocardiogram:   Heart Catheterization:     Assessment & Plan:     ICD-10-CM   1. Wheeze  R06.2     2. Moderate persistent asthma, unspecified whether complicated  O84.16     3. 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  pulmonary function tests were completed prior to today's office visit with a mixed restrictive obstructive defect, reduced TLC mildly, mildly reduced DLCO.  Plan: I think he needs to go back on his Breztri. He felt better while he was on it. Had less wheezing and chest tightness. I think he has a mixed problem related to his 30+ years history of smoking, COVID-19 with subtle changes in his lung parenchyma and possible postviral asthmatic symptoms with ongoing wheezing that is seemingly reversible. We will try to get him financial aid assistance to help with the cost of Breztri. Financial aid application given for patient today and additional  samples. Repeat CT scan of the chest in 3 months to follow-up on rib lesion seen on last CT.  Return to clinic in 3 months   Current Outpatient Medications:    albuterol (PROVENTIL) (2.5 MG/3ML) 0.083% nebulizer solution, Take 3 mLs (2.5 mg total) by nebulization every 6 (six) hours as needed for wheezing or shortness of breath., Disp: 75 mL, Rfl: 12   albuterol (PROVENTIL) (2.5 MG/3ML) 0.083% nebulizer solution, Take 3 mLs (2.5 mg total) by nebulization every 6 (six) hours as needed for wheezing or shortness of breath., Disp: 75 mL, Rfl: 12   benzonatate (TESSALON) 100 MG capsule, Take 1 capsule (100 mg total) by mouth 3 (three) times daily as needed for cough., Disp: 30 capsule, Rfl: 0   Budeson-Glycopyrrol-Formoterol (BREZTRI AEROSPHERE) 160-9-4.8 MCG/ACT AERO, Inhale 2 puffs into the lungs in the morning and at bedtime., Disp: 10.7 g, Rfl: 6   Chlorphen-Phenyleph-ASA (ALKA-SELTZER PLUS COLD) 2-7.8-325 MG TBEF, Take 2 tablets by mouth 2 (two) times daily as needed (cold symptoms)., Disp: , Rfl:    clotrimazole-betamethasone (LOTRISONE) cream, Apply to affected area 2 times daily prn, Disp: 45 g, Rfl: 0   cyclobenzaprine (FLEXERIL) 10 MG tablet, Take 1 tablet (10 mg total) by mouth 2 (two) times daily between meals as needed for muscle spasms., Disp: 30 tablet, Rfl: 0   guaiFENesin (MUCINEX) 600 MG 12 hr tablet, Take 1 tablet (  600 mg total) by mouth 2 (two) times daily., Disp: 20 tablet, Rfl: 0   ibuprofen (ADVIL) 800 MG tablet, Take 1 tablet (800 mg total) by mouth 3 (three) times daily., Disp: 21 tablet, Rfl: 0   lidocaine (LIDODERM) 5 %, Place 1 patch onto the skin daily. Remove & Discard patch within 12 hours or as directed by MD, Disp: 30 patch, Rfl: 1   predniSONE (DELTASONE) 20 MG tablet, Take 2 tablets (40 mg total) by mouth daily with breakfast., Disp: 10 tablet, Rfl: 0   Garner Nash, DO [facility] 10/15/2021 3:04 PM

## 2021-10-15 NOTE — Patient Instructions (Signed)
Full PFT performed today. °

## 2022-01-10 ENCOUNTER — Ambulatory Visit (HOSPITAL_COMMUNITY)
Admission: RE | Admit: 2022-01-10 | Discharge: 2022-01-10 | Disposition: A | Discharge status: Home / Self Care | Payer: Self-pay | Source: Ambulatory Visit | Attending: Pulmonary Disease | Admitting: Pulmonary Disease

## 2022-01-10 DIAGNOSIS — M899 Disorder of bone, unspecified: Secondary | ICD-10-CM | POA: Insufficient documentation

## 2022-01-17 ENCOUNTER — Ambulatory Visit: Payer: Medicaid Other | Admitting: Pulmonary Disease

## 2022-01-17 NOTE — Progress Notes (Incomplete)
Synopsis: Referred in November 2022 for lung nodule by No ref. provider found  Subjective:   PATIENT ID: Dylan [name] GENDER: male DOB: 1960/12/15, MRN: [id number]  No chief complaint on file.   This is a 61 year old gentleman, history of COPD, hepatitis C, smoker, cirrhosis.  Presents to the office today for evaluation of abnormal CT imaging.  He is a former smoker quit couple years ago.Patient recently was admitted to the emergency department for a COPD exacerbation.  He was started on steroids.  He recently was given a prescription for doxycycline as well.  He is almost through with this.  Patient had a CT scan of the chest in 2020 that did not have any significant nodular findings however at the time he had COVID-19 and had significant multifocal inflammatory areas within the chest.  His chest x-ray completed on 07/15/2021 revealed a nodular opacity within the left upper lobe that was also present on a chest x-ray, 07/04/2021.  I reviewed the images today with the patient in the office.  OV 10/15/2021: Here today for follow-up regarding COPD.  Patient was seen initially after an abnormal chest x-ray that revealed a upper lobe opacity.Patient had a subsequent CT image of the chest in December 2022 she had a lucency within the left rib at that time with an associated healed fracture.  Also found to have subtle subpleural groundglass opacities within the periphery of both lungs suggestive of his prior COVID-19 diagnosis.  Patient responded well to the [facility] samples that he was given last time however was unable to afford them to the pharmacy.  So he started using his ex-wife's nebulizer.  He has PFTs completed prior to today's office with normal ratio yet has reduced FEV1 and FVC also has a mildly reduced TLC and a mildly reduced DLCO.  Consistent with a mixed obstructive and restrictive pattern with his history of smoking plus COVID-19 pneumonia.Marland Kitchen  His biggest complaint is nocturnal wheezing.  Has  shortness of breath with exertion.  OV 01/17/2022: Here today for follow-up after recent CT imaging. ***   Past Medical History:  Diagnosis Date  . Cholelithiasis   . Cirrhosis ([place])   . COPD (chronic obstructive pulmonary disease) ([place])    1 year  . Gallstones   . Hepatitis C    3 years  . PONV (postoperative nausea and vomiting)   . Smoker    1/2 ppd for 40 years     Family History  Problem Relation Age of Onset  . Diabetes Mother   . Alzheimer's disease Mother   . Diabetes Maternal Grandmother   . Mesothelioma Father   . Colon cancer Neg Hx   . Stomach cancer Neg Hx      Past Surgical History:  Procedure Laterality Date  . COLONOSCOPY WITH PROPOFOL N/A 12/11/2014   Procedure: COLONOSCOPY WITH PROPOFOL;  Surgeon: Inda Castle, MD;  Location: [place];  Service: Endoscopy;  Laterality: N/A;  . ESOPHAGEAL BANDING N/A 12/11/2014   Procedure: ESOPHAGEAL BANDING;  Surgeon: Inda Castle, MD;  Location: [place];  Service: Endoscopy;  Laterality: N/A;  . ESOPHAGOGASTRODUODENOSCOPY N/A 06/06/2017   Procedure: ESOPHAGOGASTRODUODENOSCOPY (EGD);  Surgeon: Yetta Flock, MD;  Location: [facility];  Service: Gastroenterology;  Laterality: N/A;  . ESOPHAGOGASTRODUODENOSCOPY (EGD) WITH PROPOFOL N/A 12/11/2014   Procedure: ESOPHAGOGASTRODUODENOSCOPY (EGD) WITH PROPOFOL;  Surgeon: Inda Castle, MD;  Location: [place];  Service: Endoscopy;  Laterality: N/A;  . KNEE ARTHROSCOPY Right     Social History  Socioeconomic History  . Marital status: Divorced    Spouse name: Not on file  . Number of children: 0  . Years of education: Not on file  . Highest education level: Not on file  Occupational History  . Occupation: Secondary school teacher  Tobacco Use  . Smoking status: Former    Packs/day: 0.50    Years: 40.00    Pack years: 20.00    Types: Cigarettes    Quit date: 04/11/2014    Years since quitting: 7.7  . Smokeless tobacco: Never  Vaping Use  . Vaping  Use: Never used  Substance and Sexual Activity  . Alcohol use: Yes    Alcohol/week: 0.0 standard drinks    Comment: pint per week   . Drug use: Yes    Comment: hx of 28 years ago - marijuana, acid, cocaine   . Sexual activity: Yes  Other Topics Concern  . Not on file  Social History Narrative  . Not on file   Social Determinants of Health   Financial Resource Strain: Not on file  Food Insecurity: Not on file  Transportation Needs: Not on file  Physical Activity: Not on file  Stress: Not on file  Social Connections: Not on file  Intimate Partner Violence: Not on file     Allergies  Allergen Reactions  . Other Nausea And Vomiting    Narcotics- pt states he does not like to take narcotics d/t makes him sick     Outpatient Medications Prior to Visit  Medication Sig Dispense Refill  . albuterol (PROVENTIL) (2.5 MG/3ML) 0.083% nebulizer solution Take 3 mLs (2.5 mg total) by nebulization every 6 (six) hours as needed for wheezing or shortness of breath. 75 mL 12  . albuterol (PROVENTIL) (2.5 MG/3ML) 0.083% nebulizer solution Take 3 mLs (2.5 mg total) by nebulization every 6 (six) hours as needed for wheezing or shortness of breath. 75 mL 12  . benzonatate (TESSALON) 100 MG capsule Take 1 capsule (100 mg total) by mouth 3 (three) times daily as needed for cough. 30 capsule 0  . Budeson-Glycopyrrol-Formoterol (BREZTRI AEROSPHERE) 160-9-4.8 MCG/ACT AERO Inhale 2 puffs into the lungs in the morning and at bedtime. 10.7 g 6  . Chlorphen-Phenyleph-ASA (ALKA-SELTZER PLUS COLD) 2-7.8-325 MG TBEF Take 2 tablets by mouth 2 (two) times daily as needed (cold symptoms).    . clotrimazole-betamethasone (LOTRISONE) cream Apply to affected area 2 times daily prn 45 g 0  . cyclobenzaprine (FLEXERIL) 10 MG tablet Take 1 tablet (10 mg total) by mouth 2 (two) times daily between meals as needed for muscle spasms. 30 tablet 0  . guaiFENesin (MUCINEX) 600 MG 12 hr tablet Take 1 tablet (600 mg total) by  mouth 2 (two) times daily. 20 tablet 0  . ibuprofen (ADVIL) 800 MG tablet Take 1 tablet (800 mg total) by mouth 3 (three) times daily. 21 tablet 0  . lidocaine (LIDODERM) 5 % Place 1 patch onto the skin daily. Remove & Discard patch within 12 hours or as directed by MD 30 patch 1  . predniSONE (DELTASONE) 20 MG tablet Take 2 tablets (40 mg total) by mouth daily with breakfast. 10 tablet 0   No facility-administered medications prior to visit.    ROS   Objective:  Physical Exam   There were no vitals filed for this visit.    RA BMI Readings from Last 3 Encounters:  10/15/21 29.44 kg/m  07/28/21 29.32 kg/m  07/15/21 29.13 kg/m   Wt Readings from Last 3 Encounters:  10/15/21 188 lb (85.3 kg)  07/28/21 187 lb 3.2 oz (84.9 kg)  07/15/21 186 lb (84.4 kg)     CBC    Component Value Date/Time   WBC 4.8 07/15/2021 0658   RBC 5.15 07/15/2021 0658   HGB 15.7 07/15/2021 0658   HCT 48.2 07/15/2021 0658   PLT 202 07/15/2021 0658   MCV 93.6 07/15/2021 0658   MCH 30.5 07/15/2021 0658   MCHC 32.6 07/15/2021 0658   RDW 13.2 07/15/2021 0658   LYMPHSABS 1.7 07/04/2021 1425   MONOABS 0.7 07/04/2021 1425   EOSABS 0.7 (H) 07/04/2021 1425   BASOSABS 0.1 07/04/2021 1425    Chest Imaging: 11/17 and 07/04/2021 chest x-ray Left upper lobe nodular opacity  November 2020: CT chest: Multiple patchy peripheral infiltrates consistent with COVID-19. The patient's images have been independently reviewed by me.    08/19/2021 high-resolution CT scan of the chest: Subtle findings within the lung parenchyma suggestive of COVID-19 pneumonia. Additionally he has a rib lesion that was seen on the CT scan that I think we need to follow closely. The patient's images have been independently reviewed by me.    CT chest follow-up May 2023: Reviewed CT results with patient today in the office.  Rib lesion that is larger. Also possible lesion within the liver. The patient's images have been  independently reviewed by me.     Pulmonary Functions Testing Results:    Latest Ref Rng & Units 09/10/2021    2:44 PM  PFT Results  FVC-Pre L 2.46    FVC-Predicted Pre % 69    FVC-Post L 2.44    FVC-Predicted Post % 68    Pre FEV1/FVC % % 75    Post FEV1/FCV % % 83    FEV1-Pre L 1.84    FEV1-Predicted Pre % 67    FEV1-Post L 2.01    DLCO uncorrected ml/min/mmHg 18.69    DLCO UNC% % 75    DLCO corrected ml/min/mmHg 18.69    DLCO COR %Predicted % 75    DLVA Predicted % 95    TLC L 4.93    TLC % Predicted % 77    RV % Predicted % 104      FeNO:   Pathology:   Echocardiogram:   Heart Catheterization:     Assessment & Plan:     ICD-10-CM   1. Rib lesion  M89.9     2. Centrilobular emphysema ([place])  J43.2     3. 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 History of COVID-19  Z86.16     5. Liver lesion  K76.9       Discussion:  This is a 61 year old gentleman, history of COVID-19, former smoker, has recurrent shortness of breath dyspnea on exertion and wheezing.  pulmonary function tests were completed prior to today's office visit with a mixed restrictive obstructive defect, reduced TLC mildly, mildly reduced DLCO.  Plan: I think he needs to go back on his Breztri. He felt better while he was on it. Had less wheezing and chest tightness. I think he has a mixed problem related to his 30+ years history of smoking, COVID-19 with subtle changes in his lung parenchyma and possible postviral asthmatic symptoms with ongoing wheezing that is seemingly reversible. We will try to get him financial aid assistance to help with the cost of Breztri. Financial aid application given for patient today and additional samples. Repeat CT scan of  the chest in 3 months to follow-up on rib lesion seen on last CT.  Return to clinic in 3 months   Current Outpatient Medications:  .  albuterol (PROVENTIL) (2.5 MG/3ML) 0.083% nebulizer solution, Take  3 mLs (2.5 mg total) by nebulization every 6 (six) hours as needed for wheezing or shortness of breath., Disp: 75 mL, Rfl: 12 .  albuterol (PROVENTIL) (2.5 MG/3ML) 0.083% nebulizer solution, Take 3 mLs (2.5 mg total) by nebulization every 6 (six) hours as needed for wheezing or shortness of breath., Disp: 75 mL, Rfl: 12 .  benzonatate (TESSALON) 100 MG capsule, Take 1 capsule (100 mg total) by mouth 3 (three) times daily as needed for cough., Disp: 30 capsule, Rfl: 0 .  Budeson-Glycopyrrol-Formoterol (BREZTRI AEROSPHERE) 160-9-4.8 MCG/ACT AERO, Inhale 2 puffs into the lungs in the morning and at bedtime., Disp: 10.7 g, Rfl: 6 .  Chlorphen-Phenyleph-ASA (ALKA-SELTZER PLUS COLD) 2-7.8-325 MG TBEF, Take 2 tablets by mouth 2 (two) times daily as needed (cold symptoms)., Disp: , Rfl:  .  clotrimazole-betamethasone (LOTRISONE) cream, Apply to affected area 2 times daily prn, Disp: 45 g, Rfl: 0 .  cyclobenzaprine (FLEXERIL) 10 MG tablet, Take 1 tablet (10 mg total) by mouth 2 (two) times daily between meals as needed for muscle spasms., Disp: 30 tablet, Rfl: 0 .  guaiFENesin (MUCINEX) 600 MG 12 hr tablet, Take 1 tablet (600 mg total) by mouth 2 (two) times daily., Disp: 20 tablet, Rfl: 0 .  ibuprofen (ADVIL) 800 MG tablet, Take 1 tablet (800 mg total) by mouth 3 (three) times daily., Disp: 21 tablet, Rfl: 0 .  lidocaine (LIDODERM) 5 %, Place 1 patch onto the skin daily. Remove & Discard patch within 12 hours or as directed by MD, Disp: 30 patch, Rfl: 1 .  predniSONE (DELTASONE) 20 MG tablet, Take 2 tablets (40 mg total) by mouth daily with breakfast., Disp: 10 tablet, Rfl: 0   Garner Nash, DO [facility] 01/17/2022 1:17 PM

## 2022-01-28 NOTE — Progress Notes (Signed)
Dylan [name], patient was supposed to see me on 01/17/2022 but he did not show up for appointment.  I believe that he has a malignancy.  His appointment was rescheduled for July which is too far out.  Please order a nuclear medicine PET scan as well as a MRI of the abdomen with and without contrast.  Please place a referral to medical oncology.  Try to get him an appointment with APP next week ASAP.  Thanks,  BLI  Dylan Nash, DO [facility] 01/28/2022 4:41 PM

## 2022-02-02 ENCOUNTER — Other Ambulatory Visit: Payer: Self-pay

## 2022-02-02 DIAGNOSIS — C801 Malignant (primary) neoplasm, unspecified: Secondary | ICD-10-CM

## 2022-02-07 ENCOUNTER — Telehealth: Payer: Self-pay | Admitting: Physician Assistant

## 2022-02-07 NOTE — Telephone Encounter (Signed)
Scheduled appt per 6/8 referral. Pt could only come in the afternoon. Pt is aware of appt date and time. Pt is aware to arrive 15 mins prior to appt time and to bring and updated insurance card. Pt is aware of appt location.

## 2022-02-10 NOTE — Progress Notes (Unsigned)
[place] [phone]   [phone]  INITIAL CONSULTATION:  Patient Care Team: Pcp, No as PCP - General  CHIEF COMPLAINTS/PURPOSE OF CONSULTATION:  Lytic lesion  HISTORY OF PRESENTING ILLNESS:  Dylan [name] 61 y.o. male with medical history significant for COPD, hepatitis C and cirrhosis.  On review of the previous records, Dylan [name] presented to his pulmonologist, Dr. Valeta Harms for management of COPD in November 2022.  Initial CT imaging was obtained on 08/19/2021 that showed an expansile area of lucency in the lateral left fourth rib with an associated healed fracture.  Repeat CT chest was obtained on 01/10/2022 that again showed the expansile lytic lesion in the left fourth anterior lateral rib that is stable in size but has increased and thickening.   On exam today, Dylan [name] reports intermittent episodes of sharp left rib pain that has been present for several months. When the pain presents, he describes it as sharp and 10 out of 10 on a pain scale. He does not take any pain medication and waits for the pain to subside on its own. His energy levels are fairly stable and is able to complete all his daily activities on his own. He denies appetite changes or weight loss. He denies any GI symptoms including nausea, vomiting, diarrhea or constipation. He denies easy bruising or signs of active bleeding. He has shortness of breath mainly with exertion secondary to COPD. He denies fevers, chills, night sweats, chest pain or cough. He has no other complaints. Rest of the 10 point ROS is below.   MEDICAL HISTORY:  Past Medical History:  Diagnosis Date   Cholelithiasis    Cirrhosis ([place])    COPD (chronic obstructive pulmonary disease) ([place])    1 year   Gallstones    Hepatitis C    3 years   PONV (postoperative nausea and vomiting)    Smoker    1/2 ppd for 40 years    SURGICAL HISTORY: Past Surgical History:  Procedure  Laterality Date   COLONOSCOPY WITH PROPOFOL N/A 12/11/2014   Procedure: COLONOSCOPY WITH PROPOFOL;  Surgeon: Inda Castle, MD;  Location: [facility];  Service: Endoscopy;  Laterality: N/A;   ESOPHAGEAL BANDING N/A 12/11/2014   Procedure: ESOPHAGEAL BANDING;  Surgeon: Inda Castle, MD;  Location: [facility];  Service: Endoscopy;  Laterality: N/A;   ESOPHAGOGASTRODUODENOSCOPY N/A 06/06/2017   Procedure: ESOPHAGOGASTRODUODENOSCOPY (EGD);  Surgeon: Yetta Flock, MD;  Location: [facility];  Service: Gastroenterology;  Laterality: N/A;   ESOPHAGOGASTRODUODENOSCOPY (EGD) WITH PROPOFOL N/A 12/11/2014   Procedure: ESOPHAGOGASTRODUODENOSCOPY (EGD) WITH PROPOFOL;  Surgeon: Inda Castle, MD;  Location: [facility];  Service: Endoscopy;  Laterality: N/A;   KNEE ARTHROSCOPY Right     SOCIAL HISTORY: Social History   Socioeconomic History   Marital status: Divorced    Spouse name: Not on file   Number of children: 0   Years of education: Not on file   Highest education level: Not on file  Occupational History   Occupation: Secondary school teacher  Tobacco Use   Smoking status: Former    Packs/[name]: 0.50    Years: 40.00    Total pack years: 20.00    Types: Cigarettes    Quit date: 04/11/2014    Years since quitting: 7.8   Smokeless tobacco: Never  Vaping Use   Vaping Use: Never used  Substance and Sexual Activity   Alcohol use: Yes    Alcohol/week: 2.0 standard drinks of alcohol  Types: 2 Standard drinks or equivalent per week   Drug use: Not Currently    Comment: hx of 28 years ago - marijuana, acid, cocaine    Sexual activity: Yes  Other Topics Concern   Not on file  Social History Narrative   Not on file   Social Determinants of Health   Financial Resource Strain: Not on file  Food Insecurity: Not on file  Transportation Needs: Not on file  Physical Activity: Not on file  Stress: Not on file  Social Connections: Not on file  Intimate Partner Violence: Not on file     FAMILY HISTORY: Family History  Problem Relation Age of Onset   Diabetes Mother    Alzheimer's disease Mother    Diabetes Maternal Grandmother    Mesothelioma Father    Colon cancer Neg Hx    Stomach cancer Neg Hx     ALLERGIES:  is allergic to other.  MEDICATIONS:  Current Outpatient Medications  Medication Sig Dispense Refill   albuterol (PROVENTIL) (2.5 MG/3ML) 0.083% nebulizer solution Take 3 mLs (2.5 mg total) by nebulization every 6 (six) hours as needed for wheezing or shortness of breath. 75 mL 12   benzonatate (TESSALON) 100 MG capsule Take 1 capsule (100 mg total) by mouth 3 (three) times daily as needed for cough. 30 capsule 0   Budeson-Glycopyrrol-Formoterol (BREZTRI AEROSPHERE) 160-9-4.8 MCG/ACT AERO Inhale 2 puffs into the lungs in the morning and at bedtime. 10.7 g 6   Chlorphen-Phenyleph-ASA (ALKA-SELTZER PLUS COLD) 2-7.8-325 MG TBEF Take 2 tablets by mouth 2 (two) times daily as needed (cold symptoms).     cyclobenzaprine (FLEXERIL) 10 MG tablet Take 1 tablet (10 mg total) by mouth 2 (two) times daily between meals as needed for muscle spasms. 30 tablet 0   guaiFENesin (MUCINEX) 600 MG 12 hr tablet Take 1 tablet (600 mg total) by mouth 2 (two) times daily. (Patient taking differently: Take 600 mg by mouth 2 (two) times daily as needed.) 20 tablet 0   ibuprofen (ADVIL) 800 MG tablet Take 1 tablet (800 mg total) by mouth 3 (three) times daily. 21 tablet 0   oxyCODONE (OXY IR/ROXICODONE) 5 MG immediate release tablet Take 1 tablet (5 mg total) by mouth every 6 (six) hours as needed for severe pain. 30 tablet 0   No current facility-administered medications for this visit.    REVIEW OF SYSTEMS:   Constitutional: ( - ) fevers, ( - )  chills , ( - ) night sweats Eyes: ( - ) blurriness of vision, ( - ) double vision, ( - ) watery eyes Ears, nose, mouth, throat, and face: ( - ) mucositis, ( - ) sore throat Respiratory: ( - ) cough, ( - ) dyspnea, ( - )  wheezes Cardiovascular: ( - ) palpitation, ( - ) chest discomfort, ( - ) lower extremity swelling Gastrointestinal:  ( - ) nausea, ( - ) heartburn, ( - ) change in bowel habits Skin: ( - ) abnormal skin rashes Lymphatics: ( - ) new lymphadenopathy, ( - ) easy bruising Neurological: ( - ) numbness, ( - ) tingling, ( - ) new weaknesses Behavioral/Psych: ( - ) mood change, ( - ) new changes  All other systems were reviewed with the patient and are negative.  PHYSICAL EXAMINATION: ECOG PERFORMANCE STATUS: 1 - Symptomatic but completely ambulatory  Vitals:   02/11/22 1533  BP: 131/90  Pulse: 89  Resp: 17  Temp: 97.9 F (36.6 C)  SpO2: 100%  Filed Weights   02/11/22 1533  Weight: 184 lb 4.8 oz (83.6 kg)    GENERAL: well appearing male in NAD  SKIN: skin color, texture, turgor are normal, no rashes or significant lesions EYES: conjunctiva are pink and non-injected, sclera clear OROPHARYNX: no exudate, no erythema; lips, buccal mucosa, and tongue normal  NECK: supple, non-tender LYMPH:  no palpable lymphadenopathy in the cervical, axillary or supraclavicular lymph nodes.  LUNGS: clear to auscultation and percussion with normal breathing effort HEART: regular rate & rhythm and no murmurs and no lower extremity edema ABDOMEN: soft, non-tender, non-distended, normal bowel sounds Musculoskeletal: no cyanosis of digits and no clubbing. Tenderness of palpation in anterolateral left rib, no palpable mass. PSYCH: alert & oriented x 3, fluent speech NEURO: no focal motor/sensory deficits  LABORATORY DATA:  I have reviewed the data as listed    Latest Ref Rng & Units 02/11/2022    4:25 PM 07/15/2021    6:58 AM 07/04/2021    2:25 PM  CBC  WBC 4.0 - 10.5 K/uL 4.0  4.8  5.3   Hemoglobin 13.0 - 17.0 g/dL 15.7  15.7  15.9   Hematocrit 39.0 - 52.0 % 46.5  48.2  47.9   Platelets 150 - 400 K/uL 186  202  199        Latest Ref Rng & Units 02/11/2022    4:25 PM 07/15/2021    6:58 AM  07/04/2021    2:25 PM  CMP  Glucose 70 - 99 mg/dL 79  110  89   BUN 8 - 23 mg/dL '13  10  14   ' Creatinine 0.61 - 1.24 mg/dL 1.14  1.11  1.25   Sodium 135 - 145 mmol/L 141  139  139   Potassium 3.5 - 5.1 mmol/L 3.9  3.8  4.3   Chloride 98 - 111 mmol/L 108  104  106   CO2 22 - 32 mmol/L '26  27  25   ' Calcium 8.9 - 10.3 mg/dL 9.4  8.7  9.2   Total Protein 6.5 - 8.1 g/dL 8.1     Total Bilirubin 0.3 - 1.2 mg/dL 0.8     Alkaline Phos 38 - 126 U/L 66     AST 15 - 41 U/L 35     ALT 0 - 44 U/L 36        RADIOGRAPHIC STUDIES: I have personally reviewed the radiological images as listed and agreed with the findings in the report. No results found.  ASSESSMENT & PLAN Dylan [name] is a 61 y.o. male who presents to the diagnostic clinic for evaluation for expansile lytic lesion in the left fourth rib. We reviewed possible etiologies for lytic lesions including metastasis to the bone versus plasma cell neoplasms.   The workup includes serologic workup today to check CBC, CMP, SPEP with IFE, sFLC and tumor markers (AFP, CA19-9 and PSA levels). Additionally, patient is scheduled for MRI abdomen/pelvis and PET scan next week. We recommend tissue sampling with bone biopsy with possible bone marrow biopsy, pending today's lab results.   #Left rib lytic lesion: --Concerning for malignant process --Evaluate for paraproteinemia with SPEP with IFE and serum free light chains. --Due to evidence of cirrhosis and  hepatic heterogeneity seen in CT chest from 01/10/2022, we will obtain AFP and CA19-9 tumor marker levels to evaluate for [facility].  --MRI abdomen/pelvis and PET scan scheduled for 02/16/2022 --Will request bone biopsy +/- bone marrow biopsy with interventional biopsy --RTC once above workup is  complete  #Rib pain: --Secondary to lytic lesion --Sent oxycodone 5 mg once every 6 hours PRN  #Cirrhosis: --Patient was under the care of Dr. Havery Moros (GI) and last seen in October 2019. Patient did not  follow up for endoscopic evaluation with last EGD on 06/06/2017.  --We will send new referral to reestablish care.   #Age appropriate cancer screenings: --PSA level will be checked today --Last colonoscopy was in April 2016 and normal. Next due in 2026.  Orders Placed This Encounter  Procedures   CT Biopsy    Standing Status:   Future    Standing Expiration Date:   02/13/2023    Order Specific Question:   Lab orders requested (DO NOT place separate lab orders, these will be automatically ordered during procedure specimen collection):    Answer:   Surgical Pathology    Order Specific Question:   Reason for Exam (SYMPTOM  OR DIAGNOSIS REQUIRED)    Answer:   biopsy of left rib lesion and bone marrow biopsy    Order Specific Question:   Preferred location?    Answer:   [facility]   CT BONE MARROW BIOPSY & ASPIRATION    Standing Status:   Future    Standing Expiration Date:   02/14/2023    Order Specific Question:   Reason for Exam (SYMPTOM  OR DIAGNOSIS REQUIRED)    Answer:   left rib bone biopsy and bone marrow biopsy    Order Specific Question:   Preferred location?    Answer:   [facility]   CBC with Differential (Cancer Center Only)    Standing Status:   Future    Number of Occurrences:   1    Standing Expiration Date:   02/11/2023   CMP ([place] only)    Standing Status:   Future    Number of Occurrences:   1    Standing Expiration Date:   02/11/2023   Multiple Myeloma Panel (SPEP&IFE w/QIG)    Standing Status:   Future    Number of Occurrences:   1    Standing Expiration Date:   02/10/2023   Kappa/lambda light chains    Standing Status:   Future    Number of Occurrences:   1    Standing Expiration Date:   02/10/2023   Prostate-Specific AG, Serum    Standing Status:   Future    Number of Occurrences:   1    Standing Expiration Date:   02/11/2023   AFP tumor marker    Standing Status:   Future    Number of Occurrences:   1    Standing Expiration Date:    02/11/2023   CA 19.9    Standing Status:   Future    Number of Occurrences:   1    Standing Expiration Date:   02/11/2023   Ambulatory referral to Gastroenterology    Referral Priority:   Routine    Referral Type:   Consultation    Referral Reason:   Specialty Services Required    Number of Visits Requested:   1    All questions were answered. The patient knows to call the clinic with any problems, questions or concerns.  I have spent a total of 60 minutes minutes of face-to-face and non-face-to-face time, preparing to see the patient, obtaining and/or reviewing separately obtained history, performing a medically appropriate examination, counseling and educating the patient, ordering medications/tests/procedures, referring and communicating with other health care professionals, documenting clinical  information in the electronic health record,  and care coordination.   Dede Query, PA-C Department of Hematology/Oncology [place] at [facility] Phone: [phone]  Patient was last seen with Dr. Lorenso Courier  I have read the above note and personally examined the patient. I agree with the assessment and plan as noted above.  Briefly Dylan [name] is a 61 year old male who presents for evaluation of a lytic rib lesion and concern for liver lesions.  The patient does have a history of cirrhosis.  At this time the etiology of his apparent metastatic disease is unclear.  We will conduct a full work-up to include PSA, CA 19-9, AFP, and further imaging with PET CT scan and MRI abdomen to better evaluate the liver.  Once these studies are complete we will find the best biopsy site in order to appropriately diagnose this patient's likely metastatic disease.   Ledell Peoples, MD Department of Hematology/Oncology [place] at [facility] Phone: [phone] Pager: [phone] Email: [email or web address]

## 2022-02-11 ENCOUNTER — Other Ambulatory Visit: Payer: Self-pay

## 2022-02-11 ENCOUNTER — Inpatient Hospital Stay: Payer: Commercial Managed Care - HMO | Attending: Physician Assistant | Admitting: Physician Assistant

## 2022-02-11 ENCOUNTER — Inpatient Hospital Stay: Payer: Commercial Managed Care - HMO

## 2022-02-11 ENCOUNTER — Encounter: Payer: Self-pay | Admitting: Physician Assistant

## 2022-02-11 VITALS — BP 131/90 | HR 89 | Temp 97.9°F | Resp 17 | Ht 67.0 in | Wt 184.3 lb

## 2022-02-11 DIAGNOSIS — K7460 Unspecified cirrhosis of liver: Secondary | ICD-10-CM | POA: Diagnosis not present

## 2022-02-11 DIAGNOSIS — R079 Chest pain, unspecified: Secondary | ICD-10-CM | POA: Diagnosis not present

## 2022-02-11 DIAGNOSIS — M899 Disorder of bone, unspecified: Secondary | ICD-10-CM | POA: Insufficient documentation

## 2022-02-11 DIAGNOSIS — J449 Chronic obstructive pulmonary disease, unspecified: Secondary | ICD-10-CM | POA: Insufficient documentation

## 2022-02-11 DIAGNOSIS — K7469 Other cirrhosis of liver: Secondary | ICD-10-CM

## 2022-02-11 DIAGNOSIS — K769 Liver disease, unspecified: Secondary | ICD-10-CM | POA: Diagnosis not present

## 2022-02-11 DIAGNOSIS — R0781 Pleurodynia: Secondary | ICD-10-CM

## 2022-02-11 LAB — CMP (CANCER CENTER ONLY)
ALT: 36 U/L (ref 0–44)
AST: 35 U/L (ref 15–41)
Albumin: 4.3 g/dL (ref 3.5–5.0)
Alkaline Phosphatase: 66 U/L (ref 38–126)
Anion gap: 7 (ref 5–15)
BUN: 13 mg/dL (ref 8–23)
CO2: 26 mmol/L (ref 22–32)
Calcium: 9.4 mg/dL (ref 8.9–10.3)
Chloride: 108 mmol/L (ref 98–111)
Creatinine: 1.14 mg/dL (ref 0.61–1.24)
GFR, Estimated: >60 mL/min (ref >60)
Glucose, Bld: 79 mg/dL (ref 70–99)
Potassium: 3.9 mmol/L (ref 3.5–5.1)
Sodium: 141 mmol/L (ref 135–145)
Total Bilirubin: 0.8 mg/dL (ref 0.3–1.2)
Total Protein: 8.1 g/dL (ref 6.5–8.1)

## 2022-02-11 LAB — CBC WITH DIFFERENTIAL (CANCER CENTER ONLY)
Abs Immature Granulocytes: 0 10*3/uL (ref 0.00–0.07)
Basophils Absolute: 0 10*3/uL (ref 0.0–0.1)
Basophils Relative: 1 %
Eosinophils Absolute: 0.4 10*3/uL (ref 0.0–0.5)
Eosinophils Relative: 11 %
HCT: 46.5 % (ref 39.0–52.0)
Hemoglobin: 15.7 g/dL (ref 13.0–17.0)
Immature Granulocytes: 0 %
Lymphocytes Relative: 36 %
Lymphs Abs: 1.4 10*3/uL (ref 0.7–4.0)
MCH: 30.6 pg (ref 26.0–34.0)
MCHC: 33.8 g/dL (ref 30.0–36.0)
MCV: 90.6 fL (ref 80.0–100.0)
Monocytes Absolute: 0.6 10*3/uL (ref 0.1–1.0)
Monocytes Relative: 15 %
Neutro Abs: 1.5 10*3/uL — ABNORMAL LOW (ref 1.7–7.7)
Neutrophils Relative %: 37 %
Platelet Count: 186 10*3/uL (ref 150–400)
RBC: 5.13 MIL/uL (ref 4.22–5.81)
RDW: 13.2 % (ref 11.5–15.5)
WBC Count: 4 10*3/uL (ref 4.0–10.5)
nRBC: 0 % (ref 0.0–0.2)

## 2022-02-11 MED ORDER — OXYCODONE HCL 5 MG PO TABS: 5.0000 mg | ORAL_TABLET | Freq: Four times a day (QID) | ORAL | 0 refills | Status: DC | PRN

## 2022-02-12 LAB — CANCER ANTIGEN 19-9: CA 19-9: 14 U/mL (ref 0–35)

## 2022-02-12 LAB — AFP TUMOR MARKER: AFP, Serum, Tumor Marker: 5.7 ng/mL (ref 0.0–8.4)

## 2022-02-12 LAB — PROSTATE-SPECIFIC AG, SERUM (LABCORP): Prostate Specific Ag, Serum: 26.1 ng/mL — ABNORMAL HIGH (ref 0.0–4.0)

## 2022-02-14 ENCOUNTER — Other Ambulatory Visit (HOSPITAL_COMMUNITY): Payer: Medicaid Other

## 2022-02-14 ENCOUNTER — Other Ambulatory Visit: Payer: Medicaid Other

## 2022-02-14 LAB — KAPPA/LAMBDA LIGHT CHAINS
Kappa free light chain: 23.5 mg/L — ABNORMAL HIGH (ref 3.3–19.4)
Kappa, lambda light chain ratio: 1.7 — ABNORMAL HIGH (ref 0.26–1.65)
Lambda free light chains: 13.8 mg/L (ref 5.7–26.3)

## 2022-02-15 ENCOUNTER — Other Ambulatory Visit: Payer: Self-pay | Admitting: Physician Assistant

## 2022-02-15 DIAGNOSIS — R21 Rash and other nonspecific skin eruption: Secondary | ICD-10-CM

## 2022-02-16 ENCOUNTER — Ambulatory Visit (HOSPITAL_COMMUNITY)
Admission: RE | Admit: 2022-02-16 | Discharge: 2022-02-16 | Disposition: A | Discharge status: Home / Self Care | Payer: Commercial Managed Care - HMO | Source: Ambulatory Visit | Attending: Pulmonary Disease | Admitting: Pulmonary Disease

## 2022-02-16 ENCOUNTER — Encounter (HOSPITAL_COMMUNITY)
Admission: RE | Admit: 2022-02-16 | Discharge: 2022-02-16 | Disposition: A | Discharge status: Home / Self Care | Payer: Commercial Managed Care - HMO | Source: Ambulatory Visit | Attending: Pulmonary Disease | Admitting: Pulmonary Disease

## 2022-02-16 DIAGNOSIS — C801 Malignant (primary) neoplasm, unspecified: Secondary | ICD-10-CM

## 2022-02-16 LAB — GLUCOSE, CAPILLARY: Glucose-Capillary: 93 mg/dL (ref 70–99)

## 2022-02-16 MED ORDER — FLUDEOXYGLUCOSE F - 18 (FDG) INJECTION
9.2000 | Freq: Once | INTRAVENOUS | Status: AC
  Administered 2022-02-16: 9.2 via INTRAVENOUS

## 2022-02-16 MED ORDER — GADOXETATE DISODIUM 0.25 MMOL/ML IV SOLN
10.0000 mL | Freq: Once | INTRAVENOUS | Status: AC | PRN
  Administered 2022-02-16: 10 mL via INTRAVENOUS

## 2022-02-17 LAB — MULTIPLE MYELOMA PANEL, SERUM
Albumin SerPl Elph-Mcnc: 4.2 g/dL (ref 2.9–4.4)
Albumin/Glob SerPl: 1.3 (ref 0.7–1.7)
Alpha 1: 0.2 g/dL (ref 0.0–0.4)
Alpha2 Glob SerPl Elph-Mcnc: 0.8 g/dL (ref 0.4–1.0)
B-Globulin SerPl Elph-Mcnc: 1.2 g/dL (ref 0.7–1.3)
Gamma Glob SerPl Elph-Mcnc: 1.2 g/dL (ref 0.4–1.8)
Globulin, Total: 3.3 g/dL (ref 2.2–3.9)
IgA: 363 mg/dL (ref 61–437)
IgG (Immunoglobin G), Serum: 1293 mg/dL (ref 603–1613)
IgM (Immunoglobulin M), Srm: 45 mg/dL (ref 20–172)
Total Protein ELP: 7.5 g/dL (ref 6.0–8.5)

## 2022-02-28 ENCOUNTER — Ambulatory Visit: Payer: Commercial Managed Care - HMO

## 2022-03-02 ENCOUNTER — Telehealth: Payer: Self-pay | Admitting: *Deleted

## 2022-03-02 NOTE — Telephone Encounter (Signed)
Notified Lc that he can take 2 tablets every 8 hours as needed for pain. He states he has 2 jobs and doesn't want to be doped up, usually does not take narcotics. Instructed to take 2 tablets at bedtime. He will call Dr Lorenso Courier on Thursday to discuss. He has 5 tablets of Oxycodone 5 mg left.

## 2022-03-02 NOTE — Telephone Encounter (Signed)
Pt states Dylan Hodgkins PA said for him to call if he has any increased pain. Pain is an 8 now. "Pain is constant on left side, 4th rib". Is taking Oxycodone 5 mg every 6 hours and has been taking Ibuprofen 800 mg in between.   Please advise

## 2022-03-03 ENCOUNTER — Other Ambulatory Visit: Payer: Self-pay | Admitting: Radiology

## 2022-03-03 DIAGNOSIS — D696 Thrombocytopenia, unspecified: Secondary | ICD-10-CM

## 2022-03-04 ENCOUNTER — Other Ambulatory Visit: Payer: Self-pay

## 2022-03-04 ENCOUNTER — Ambulatory Visit
Admission: RE | Admit: 2022-03-04 | Discharge: 2022-03-04 | Disposition: A | Discharge status: Home / Self Care | Payer: Commercial Managed Care - HMO | Source: Ambulatory Visit | Attending: Physician Assistant | Admitting: Physician Assistant

## 2022-03-04 DIAGNOSIS — C7952 Secondary malignant neoplasm of bone marrow: Secondary | ICD-10-CM | POA: Insufficient documentation

## 2022-03-04 DIAGNOSIS — D696 Thrombocytopenia, unspecified: Secondary | ICD-10-CM | POA: Insufficient documentation

## 2022-03-04 DIAGNOSIS — M899 Disorder of bone, unspecified: Secondary | ICD-10-CM

## 2022-03-04 DIAGNOSIS — R972 Elevated prostate specific antigen [PSA]: Secondary | ICD-10-CM | POA: Diagnosis not present

## 2022-03-04 LAB — CBC WITH DIFFERENTIAL/PLATELET
Abs Immature Granulocytes: 0.01 10*3/uL (ref 0.00–0.07)
Basophils Absolute: 0.1 10*3/uL (ref 0.0–0.1)
Basophils Relative: 1 %
Eosinophils Absolute: 0.4 10*3/uL (ref 0.0–0.5)
Eosinophils Relative: 10 %
HCT: 46.7 % (ref 39.0–52.0)
Hemoglobin: 15.4 g/dL (ref 13.0–17.0)
Immature Granulocytes: 0 %
Lymphocytes Relative: 29 %
Lymphs Abs: 1.2 10*3/uL (ref 0.7–4.0)
MCH: 29.9 pg (ref 26.0–34.0)
MCHC: 33 g/dL (ref 30.0–36.0)
MCV: 90.7 fL (ref 80.0–100.0)
Monocytes Absolute: 0.7 10*3/uL (ref 0.1–1.0)
Monocytes Relative: 17 %
Neutro Abs: 1.8 10*3/uL (ref 1.7–7.7)
Neutrophils Relative %: 43 %
Platelets: 178 10*3/uL (ref 150–400)
RBC: 5.15 MIL/uL (ref 4.22–5.81)
RDW: 13.2 % (ref 11.5–15.5)
WBC: 4.1 10*3/uL (ref 4.0–10.5)
nRBC: 0 % (ref 0.0–0.2)

## 2022-03-04 MED ORDER — FENTANYL CITRATE (PF) 100 MCG/2ML IJ SOLN
INTRAMUSCULAR | Status: AC
  Filled 2022-03-04: qty 2

## 2022-03-04 MED ORDER — MIDAZOLAM HCL 2 MG/2ML IJ SOLN
INTRAMUSCULAR | Status: AC
  Filled 2022-03-04: qty 4

## 2022-03-04 MED ORDER — FENTANYL CITRATE (PF) 100 MCG/2ML IJ SOLN
INTRAMUSCULAR | Status: AC | PRN
  Administered 2022-03-04 (×2): 50 ug via INTRAVENOUS

## 2022-03-04 MED ORDER — MIDAZOLAM HCL 5 MG/5ML IJ SOLN
INTRAMUSCULAR | Status: AC | PRN
  Administered 2022-03-04: 1 mg via INTRAVENOUS

## 2022-03-04 MED ORDER — ONDANSETRON 4 MG PO TBDP
4.0000 mg | ORAL_TABLET | Freq: Once | ORAL | Status: AC
  Administered 2022-03-04: 4 mg via ORAL
  Filled 2022-03-04: qty 1

## 2022-03-04 MED ORDER — SODIUM CHLORIDE 0.9 % IV SOLN: INTRAVENOUS | Status: DC

## 2022-03-04 MED ORDER — ONDANSETRON 4 MG PO TBDP
ORAL_TABLET | ORAL | Status: AC
  Filled 2022-03-04: qty 1

## 2022-03-04 MED ORDER — HEPARIN SOD (PORK) LOCK FLUSH 100 UNIT/ML IV SOLN
INTRAVENOUS | Status: AC
  Filled 2022-03-04: qty 5

## 2022-03-04 MED ORDER — MIDAZOLAM HCL 2 MG/2ML IJ SOLN
INTRAMUSCULAR | Status: AC | PRN
  Administered 2022-03-04: 1 mg via INTRAVENOUS

## 2022-03-04 NOTE — OR Nursing (Signed)
Pt nausea improved. Discharged home

## 2022-03-04 NOTE — Progress Notes (Signed)
Patient clinically stable post BMB/L4 bone biopsy per DR Earleen Newport, tolerated well. Vitals stable pre and post procedure. Received Versed 2 mg along with Fentanyl 100 mcg IV for procedure. Report given to Carlynn Spry RN post procedure/specials.

## 2022-03-04 NOTE — Consult Note (Signed)
Chief Complaint: Patient was seen in consultation today for bone marrow biopsy and biopsy of L4 vertebral body  at the request of Thayil,Irene T  Referring Physician(s): Lincoln Brigham  Supervising Physician: Corrie Mckusick  Patient Status: [facility] - Out-pt  History of Present Illness: Dylan [name] is a 61 y.o. male with PMH of COPD, hepatitis C, and cirrhosis being seen today for a bone marrow biopsy and L4 vertebral body biopsy. The patient originally reported to his pulmonologist in November of 2022 for management of his COPD. CT Chest performed on 08/19/21 revealed "expansile area of lucency in the lateral left fourth rib with an associated healed fracture." The patient also has reported left rib pain that is intermittent but severe. Follow-up CT on 01/10/22 revealed findings worrisome for malignancy. MRI Abdomen on 6/21 revealed an enhancing lesion in the L4 vertebral body suspicious for metastasis. Patient was referred to IR in June of 2023 for bone marrow biopsy and biopsy of lesion in L4 vertebral body.   Past Medical History:  Diagnosis Date   Cholelithiasis    Cirrhosis ([place])    COPD (chronic obstructive pulmonary disease) ([place])    1 year   Gallstones    Hepatitis C    3 years   PONV (postoperative nausea and vomiting)    Smoker    1/2 ppd for 40 years    Past Surgical History:  Procedure Laterality Date   COLONOSCOPY WITH PROPOFOL N/A 12/11/2014   Procedure: COLONOSCOPY WITH PROPOFOL;  Surgeon: Inda Castle, MD;  Location: [facility];  Service: Endoscopy;  Laterality: N/A;   ESOPHAGEAL BANDING N/A 12/11/2014   Procedure: ESOPHAGEAL BANDING;  Surgeon: Inda Castle, MD;  Location: [facility];  Service: Endoscopy;  Laterality: N/A;   ESOPHAGOGASTRODUODENOSCOPY N/A 06/06/2017   Procedure: ESOPHAGOGASTRODUODENOSCOPY (EGD);  Surgeon: Yetta Flock, MD;  Location: [facility];  Service: Gastroenterology;  Laterality: N/A;   ESOPHAGOGASTRODUODENOSCOPY (EGD)  WITH PROPOFOL N/A 12/11/2014   Procedure: ESOPHAGOGASTRODUODENOSCOPY (EGD) WITH PROPOFOL;  Surgeon: Inda Castle, MD;  Location: [facility];  Service: Endoscopy;  Laterality: N/A;   KNEE ARTHROSCOPY Right     Allergies: Other  Medications: Prior to Admission medications   Medication Sig Start Date End Date Taking? Authorizing Provider  benzonatate (TESSALON) 100 MG capsule Take 1 capsule (100 mg total) by mouth 3 (three) times daily as needed for cough. 09/07/21  Yes Brunetta Jeans, PA-C  Budeson-Glycopyrrol-Formoterol (BREZTRI AEROSPHERE) 160-9-4.8 MCG/ACT AERO Inhale 2 puffs into the lungs in the morning and at bedtime. 07/28/21  Yes Icard, Bradley L, DO  ibuprofen (ADVIL) 800 MG tablet Take 1 tablet (800 mg total) by mouth 3 (three) times daily. 04/05/21  Yes Montine Circle, PA-C  oxyCODONE (OXY IR/ROXICODONE) 5 MG immediate release tablet Take 1 tablet (5 mg total) by mouth every 6 (six) hours as needed for severe pain. 02/11/22  Yes Dede Query T, PA-C  albuterol (PROVENTIL) (2.5 MG/3ML) 0.083% nebulizer solution Take 3 mLs (2.5 mg total) by nebulization every 6 (six) hours as needed for wheezing or shortness of breath. 07/15/21   Jeanell Sparrow, DO  Chlorphen-Phenyleph-ASA (ALKA-SELTZER PLUS COLD) 2-7.8-325 MG TBEF Take 2 tablets by mouth 2 (two) times daily as needed (cold symptoms).    [provider]  cyclobenzaprine (FLEXERIL) 10 MG tablet Take 1 tablet (10 mg total) by mouth 2 (two) times daily between meals as needed for muscle spasms. 09/08/21   Charlott Rakes, MD  guaiFENesin (MUCINEX) 600 MG 12 hr tablet Take  1 tablet (600 mg total) by mouth 2 (two) times daily. Patient taking differently: Take 600 mg by mouth 2 (two) times daily as needed. 07/21/19   Cherene Altes, MD     Family History  Problem Relation Age of Onset   Diabetes Mother    Alzheimer's disease Mother    Diabetes Maternal Grandmother    Mesothelioma Father    Colon cancer Neg Hx     Stomach cancer Neg Hx     Social History   Socioeconomic History   Marital status: Divorced    Spouse name: Not on file   Number of children: 0   Years of education: Not on file   Highest education level: Not on file  Occupational History   Occupation: Secondary school teacher  Tobacco Use   Smoking status: Former    Packs/day: 0.50    Years: 40.00    Total pack years: 20.00    Types: Cigarettes    Quit date: 04/11/2014    Years since quitting: 7.9   Smokeless tobacco: Never  Vaping Use   Vaping Use: Never used  Substance and Sexual Activity   Alcohol use: Yes    Alcohol/week: 2.0 standard drinks of alcohol    Types: 2 Standard drinks or equivalent per week   Drug use: Not Currently    Comment: hx of 28 years ago - marijuana, acid, cocaine    Sexual activity: Yes  Other Topics Concern   Not on file  Social History Narrative   Not on file   Social Determinants of Health   Financial Resource Strain: Not on file  Food Insecurity: Not on file  Transportation Needs: Not on file  Physical Activity: Not on file  Stress: Not on file  Social Connections: Not on file     Review of Systems: A 12 point ROS discussed and pertinent positives are indicated in the HPI above.  All other systems are negative.  Review of Systems  Constitutional:  Negative for chills and fever.  Respiratory:  Negative for chest tightness and shortness of breath.   Cardiovascular:  Positive for chest pain. Negative for leg swelling.       Pt states he has pain along his fourth rib on the left side that is intermittent but severe  Gastrointestinal:  Negative for abdominal pain, diarrhea, nausea and vomiting.  Neurological:  Negative for dizziness, light-headedness and headaches.  Psychiatric/Behavioral:  Negative for confusion.     Vital Signs: BP (!) 148/86   Pulse 91   Temp 97.9 F (36.6 C) (Oral)   Resp 20   Ht '5\' 7"'  (1.702 m)   Wt 185 lb 13.6 oz (84.3 kg)   SpO2 99%   BMI 29.11 kg/m      Physical Exam Constitutional:      General: He is not in acute distress.    Appearance: Normal appearance. He is not ill-appearing.  HENT:     Mouth/Throat:     Mouth: Mucous membranes are moist.  Cardiovascular:     Rate and Rhythm: Normal rate and regular rhythm.     Pulses: Normal pulses.     Heart sounds: Normal heart sounds.  Pulmonary:     Effort: Pulmonary effort is normal.     Breath sounds: Normal breath sounds.  Abdominal:     General: Bowel sounds are normal.     Palpations: Abdomen is soft.     Tenderness: There is no abdominal tenderness.  Musculoskeletal:     Right  lower leg: No edema.     Left lower leg: No edema.  Skin:    General: Skin is warm and dry.  Neurological:     Mental Status: He is alert and oriented to person, place, and time.  Psychiatric:        Mood and Affect: Mood normal.        Behavior: Behavior normal.        Thought Content: Thought content normal.        Judgment: Judgment normal.     Imaging: NM PET Image Initial (PI) Skull Base To Thigh  Result Date: 02/17/2022 CLINICAL DATA:  Initial treatment strategy for lytic destructive bone lesions. EXAM: NUCLEAR MEDICINE PET SKULL BASE TO THIGH TECHNIQUE: 9.2 mCi F-18 FDG was injected intravenously. Full-ring PET imaging was performed from the skull base to thigh after the radiotracer. CT data was obtained and used for attenuation correction and anatomic localization. Fasting blood glucose: 93 mg/dl COMPARISON:  MRI abdomen, same day and chest CT 01/10/2022 FINDINGS: Mediastinal blood pool activity: SUV max 2.80 Liver activity: SUV max NA NECK: No hypermetabolic lymph nodes in the neck. Incidental CT findings: Right maxillary sinus disease. CHEST: No hypermetabolic mediastinal or hilar nodes. No suspicious pulmonary nodules on the CT scan. Incidental CT findings: none ABDOMEN/PELVIS: No abnormal hypermetabolic activity within the liver, pancreas, adrenal glands, or spleen. No hypermetabolic  lymph nodes in the abdomen or pelvis. There is mild asymmetric hypermetabolism in the peripheral zone of the prostate gland on the right side. SUV max is 4.99. Could not exclude the possibility of prostate cancer. Recommend correlation with PSA level. Incidental CT findings: Advanced cirrhotic changes involving the liver. The MRI shows small enhancing liver lesions indeterminate for HCC. Numerous small layering gallstones are noted the gallbladder. Renal calculi are noted bilaterally. Scattered aortic and iliac artery calcifications but no aneurysm. SKELETON: The left fourth rib lesion is hypermetabolic with SUV max of 0.97. The mixed lytic and sclerotic bone lesion causing spinal canal compromise at L4 demonstrates mild hypermetabolism with SUV max of 3.31. I do not see any other definite lesions. Biopsy of the rib lesion may be indicated. Incidental CT findings: none IMPRESSION: 1. Hypermetabolic left fourth rib lesion and L4 vertebral body lesion consistent with metastatic disease. 2. Mild hypermetabolism in the peripheral zone of the prostate gland on the right side. Could not exclude prostate cancer. Recommend correlation with PSA level. 3. No other primary neoplastic site is identified. The small liver lesions seen on the MRI are not hypermetabolic the PET scan. 4. Stable cirrhotic changes involving the liver. 5. Other incidental findings including gallstones and renal calculi. Electronically Signed   By: Marijo Sanes M.D.   On: 02/17/2022 16:08   MR ABDOMEN WWO CONTRAST  Result Date: 02/16/2022 CLINICAL DATA:  Hepatic cirrhosis, suspected malignancy EXAM: MRI ABDOMEN WITHOUT AND WITH CONTRAST TECHNIQUE: Multiplanar multisequence MR imaging of the abdomen was performed both before and after the administration of intravenous contrast. CONTRAST:  10 mL Eovist COMPARISON:  CT abdomen and pelvis 12/16/2018 FINDINGS: Lower chest: No acute findings. Hepatobiliary: Nodular contour of the liver consistent with  cirrhosis. Also evidence of hepatic steatosis. There are multifocal amorphous areas of early arterial phase hyperenhancement most prominent in the right hepatic lobe, largest areas include approximately 2.4 cm area peripherally in segment 5, 1.8 cm posteriorly in segment 7, and 1.7 cm focus in the central right hepatic lobe just to the right of the IVC. All of these areas are essentially isointense  on the more delayed postcontrast sequences, with no washout lesion identified and no enhancing capsule appreciated. No significant corresponding hyperintense T2 or DWI signal. Multiple small gallstones. No gallbladder wall thickening or surrounding edema. No biliary ductal dilatation. Pancreas: No mass, inflammatory changes, or other parenchymal abnormality identified. Spleen:  Within normal limits in size and appearance. Adrenals/Urinary Tract: No masses identified. No evidence of hydronephrosis. Stomach/Bowel: No evidence of bowel obstruction. Vascular/Lymphatic: No pathologically enlarged lymph nodes identified. No abdominal aortic aneurysm demonstrated. Other:  No ascites. Musculoskeletal: There is an approximately 3.2 x 2.7 cm suspicious enhancing lesion in the L4 vertebral body which appears expansile dorsally causing spinal canal stenosis. IMPRESSION: 1. Enhancing lesion in the L4 vertebral body suspicious for metastasis, which appears expansile dorsally causing spinal canal stenosis. Consider whole-body bone scan and/or MRI lumbar spine with contrast as indicated. 2. Hepatic cirrhosis with multifocal hepatic arterial phase enhancing areas as described, LI-RADS 3. Consider repeat imaging in 3-6 months. 3. Hepatic steatosis. 4. Cholelithiasis. Electronically Signed   By: Ofilia Neas M.D.   On: 02/16/2022 14:52    Labs:  CBC: Recent Labs    07/04/21 1425 07/15/21 0658 02/11/22 1625 03/04/22 0801  WBC 5.3 4.8 4.0 4.1  HGB 15.9 15.7 15.7 15.4  HCT 47.9 48.2 46.5 46.7  PLT 199 202 186 178     COAGS: No results for input(s): "INR", "APTT" in the last 8760 hours.  BMP: Recent Labs    04/04/21 1654 07/04/21 1425 07/15/21 0658 02/11/22 1625  NA 139 139 139 141  K 3.9 4.3 3.8 3.9  CL 107 106 104 108  CO2 '24 25 27 26  ' GLUCOSE 92 89 110* 79  BUN '12 14 10 13  ' CALCIUM 9.1 9.2 8.7* 9.4  CREATININE 1.20 1.25* 1.11 1.14  GFRNONAA >60 >60 >60 >60    LIVER FUNCTION TESTS: Recent Labs    03/13/21 1229 04/04/21 1654 02/11/22 1625  BILITOT 1.0 1.3* 0.8  AST 36 38 35  ALT 40 37 36  ALKPHOS 55 74 66  PROT 7.7 7.7 8.1  ALBUMIN 4.0 4.1 4.3    TUMOR MARKERS: No results for input(s): "AFPTM", "CEA", "CA199", "CHROMGRNA" in the last 8760 hours.  Assessment and Plan:  Dylan [name] is a 61 yo male being seen today for bone marrow biopsy and biopsy of L4 vertebral body. Imaging performed in December 2022, May 2023, and June 2023 revealed findings concerning for malignancy. Patient was referred to IR for evaluation. Dr Earleen Newport has reviewed imaging and approved patient for bone marrow biopsy and biopsy of L4 vertebral body on 03/04/22.  Risks and benefits of bone marrow biopsy and L4 vertebral biopsy was discussed with the patient  including, but not limited to bleeding, infection, damage to adjacent structures or low yield requiring additional tests.  All of the questions were answered and there is agreement to proceed.  Consent signed and in chart.   Thank you for this interesting consult.  I greatly enjoyed meeting Dylan [name] and look forward to participating in their care.  A copy of this report was sent to the requesting provider on this date.  Electronically Signed: Lura Em, PA-C 03/04/2022, 8:51 AM   I spent a total of  15 Minutes   in face to face in clinical consultation, greater than 50% of which was counseling/coordinating care for bone marrow biopsy and L4 vertebral body biopsy.

## 2022-03-04 NOTE — OR Nursing (Signed)
Pt dressed to discharge. Iv removed. Sudden onset nausea vomiting. MD notified Oral zofran ordered and given.

## 2022-03-04 NOTE — Procedures (Signed)
Interventional Radiology Procedure Note  Procedure: CT guided aspirate and core biopsy of left posterior iliac bone  CT guided L4 lytic bone lesion bx.   Complications: None Specimen: Marrow biospy L4 lesion bx, with 2 core  Recommendations: - Bedrest supine x 1 hrs - OTC's PRN  Pain - Follow biopsy results  Signed,  Dulcy Fanny. Earleen Newport, DO

## 2022-03-08 ENCOUNTER — Encounter: Payer: Self-pay | Admitting: Physician Assistant

## 2022-03-08 ENCOUNTER — Telehealth: Payer: Self-pay | Admitting: Physician Assistant

## 2022-03-08 DIAGNOSIS — C61 Malignant neoplasm of prostate: Secondary | ICD-10-CM | POA: Insufficient documentation

## 2022-03-08 HISTORY — DX: Malignant neoplasm of prostate: C61

## 2022-03-08 LAB — SURGICAL PATHOLOGY

## 2022-03-08 MED ORDER — OXYCODONE HCL 10 MG PO TABS: 10.0000 mg | ORAL_TABLET | Freq: Four times a day (QID) | ORAL | 0 refills | Status: DC | PRN

## 2022-03-08 NOTE — Telephone Encounter (Signed)
I called Mr. Dylan [name] to review the biopsy results from the L4 bone biopsy. 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 Patient will follow up with Dr. Alen Blew to discuss treatment recommendations and to start Firmagon injection tomorrow, 03/09/2022. Due to ongoing bone pain, we will make a referral to radiation oncology to discuss palliative radiation. In the interim, I refilled his prescription of oxycodone but increased dose to 10 mg PO every 6 hours as needed. Patient expressed understanding of the diagnosis and the plan provided.

## 2022-03-09 ENCOUNTER — Inpatient Hospital Stay: Payer: Commercial Managed Care - HMO | Attending: Physician Assistant | Admitting: Oncology

## 2022-03-09 ENCOUNTER — Telehealth: Payer: Self-pay

## 2022-03-09 ENCOUNTER — Encounter: Payer: Self-pay | Admitting: Physician Assistant

## 2022-03-09 ENCOUNTER — Other Ambulatory Visit (HOSPITAL_COMMUNITY): Payer: Self-pay

## 2022-03-09 ENCOUNTER — Inpatient Hospital Stay: Payer: Commercial Managed Care - HMO

## 2022-03-09 ENCOUNTER — Telehealth: Payer: Self-pay | Admitting: Pharmacy Technician

## 2022-03-09 VITALS — BP 139/91 | HR 89 | Temp 98.1°F | Resp 17 | Ht 67.0 in | Wt 185.1 lb

## 2022-03-09 DIAGNOSIS — C61 Malignant neoplasm of prostate: Secondary | ICD-10-CM | POA: Diagnosis present

## 2022-03-09 DIAGNOSIS — C7951 Secondary malignant neoplasm of bone: Secondary | ICD-10-CM

## 2022-03-09 DIAGNOSIS — Z5111 Encounter for antineoplastic chemotherapy: Secondary | ICD-10-CM | POA: Diagnosis present

## 2022-03-09 LAB — SURGICAL PATHOLOGY

## 2022-03-09 MED ORDER — DEGARELIX ACETATE(240 MG DOSE) 120 MG/VIAL ~~LOC~~ SOLR
240.0000 mg | Freq: Once | SUBCUTANEOUS | Status: AC
  Administered 2022-03-09: 240 mg via SUBCUTANEOUS
  Filled 2022-03-09: qty 6

## 2022-03-09 MED ORDER — PREDNISONE 5 MG PO TABS: 5.0000 mg | ORAL_TABLET | Freq: Every day | ORAL | 1 refills | Status: DC

## 2022-03-09 MED ORDER — ABIRATERONE ACETATE 250 MG PO TABS
1000.0000 mg | ORAL_TABLET | Freq: Every day | ORAL | 0 refills | Status: DC
Start: 2022-03-09 — End: 2022-03-15
  Filled 2022-03-09: qty 120, 30d supply, fill #0

## 2022-03-09 NOTE — Progress Notes (Signed)
Reason for the request: Prostate cancer  HPI: I was asked by Dr. Valeta Harms to evaluate Dylan [name] with the evaluation of prostate cancer.  He is a 61 year old man with history of COPD and cirrhosis of the liver who obtained imaging studies in December 2022 which showed an expansile lesion in the lateral fourth rib with associated healed fracture.  A repeat CT scan of the chest obtained on Jan 10, 2022 showed expansile lytic lesion in the left fourth anterior lateral rib is stable but has increased in thickening.  Based on these findings he was evaluated for possible malignancy including PET scan which showed hypermetabolic left fourth rib lesion as well as L4 vertebral body consistent with metastatic disease.  There is mild hypermetabolism in the peripheral zone of the prostate gland.  MRI of the liver previously showed a L4 vertebral body which was confirmed by the PET scan with hepatic cirrhosis and multifocal hepatic lesions that are enhancing.  PSA obtained on February 11, 2022 was elevated at 26.1.  He underwent tissue biopsy March 04, 2022 with a CT-guided L4 bone lesion biopsy which confirmed the presence of metastatic adenocarcinoma and is positive for PSA.  Clinically, he reports feeling reasonably well with few complaints.  He has pain around the left side of his chest wall rib radiating to the back.  He still able to work and perform work-related duties without any issues.  He does report urinary complaints including urinary frequency and hesitancy.  No dysuria or hematuria.   He does not report any headaches, blurry vision, syncope or seizures. Does not report any fevers, chills or sweats.  Does not report any cough, wheezing or hemoptysis.  Does not report any chest pain, palpitation, orthopnea or leg edema.  Does not report any nausea, vomiting or abdominal pain.  Does not report any constipation or diarrhea.  Does not report any skeletal complaints.    Does not report frequency, urgency or hematuria.   Does not report any skin rashes or lesions. Does not report any heat or cold intolerance.  Does not report any lymphadenopathy or petechiae.  Does not report any anxiety or depression.  Remaining review of systems is negative.     Past Medical History:  Diagnosis Date   Cholelithiasis    Cirrhosis ([place])    COPD (chronic obstructive pulmonary disease) ([place])    1 year   Gallstones    Hepatitis C    3 years   PONV (postoperative nausea and vomiting)    Prostate cancer metastatic to bone ([facility]) 03/08/2022   Smoker    1/2 ppd for 40 years  :   Past Surgical History:  Procedure Laterality Date   COLONOSCOPY WITH PROPOFOL N/A 12/11/2014   Procedure: COLONOSCOPY WITH PROPOFOL;  Surgeon: Inda Castle, MD;  Location: [facility];  Service: Endoscopy;  Laterality: N/A;   ESOPHAGEAL BANDING N/A 12/11/2014   Procedure: ESOPHAGEAL BANDING;  Surgeon: Inda Castle, MD;  Location: [facility];  Service: Endoscopy;  Laterality: N/A;   ESOPHAGOGASTRODUODENOSCOPY N/A 06/06/2017   Procedure: ESOPHAGOGASTRODUODENOSCOPY (EGD);  Surgeon: Yetta Flock, MD;  Location: [facility];  Service: Gastroenterology;  Laterality: N/A;   ESOPHAGOGASTRODUODENOSCOPY (EGD) WITH PROPOFOL N/A 12/11/2014   Procedure: ESOPHAGOGASTRODUODENOSCOPY (EGD) WITH PROPOFOL;  Surgeon: Inda Castle, MD;  Location: [facility];  Service: Endoscopy;  Laterality: N/A;   KNEE ARTHROSCOPY Right   :   Current Outpatient Medications:    albuterol (PROVENTIL) (2.5 MG/3ML) 0.083% nebulizer solution, Take 3 mLs (2.5 mg  total) by nebulization every 6 (six) hours as needed for wheezing or shortness of breath., Disp: 75 mL, Rfl: 12   benzonatate (TESSALON) 100 MG capsule, Take 1 capsule (100 mg total) by mouth 3 (three) times daily as needed for cough., Disp: 30 capsule, Rfl: 0   Budeson-Glycopyrrol-Formoterol (BREZTRI AEROSPHERE) 160-9-4.8 MCG/ACT AERO, Inhale 2 puffs into the lungs in the morning and at bedtime., Disp: 10.7 g,  Rfl: 6   Chlorphen-Phenyleph-ASA (ALKA-SELTZER PLUS COLD) 2-7.8-325 MG TBEF, Take 2 tablets by mouth 2 (two) times daily as needed (cold symptoms)., Disp: , Rfl:    cyclobenzaprine (FLEXERIL) 10 MG tablet, Take 1 tablet (10 mg total) by mouth 2 (two) times daily between meals as needed for muscle spasms., Disp: 30 tablet, Rfl: 0   guaiFENesin (MUCINEX) 600 MG 12 hr tablet, Take 1 tablet (600 mg total) by mouth 2 (two) times daily. (Patient taking differently: Take 600 mg by mouth 2 (two) times daily as needed.), Disp: 20 tablet, Rfl: 0   ibuprofen (ADVIL) 800 MG tablet, Take 1 tablet (800 mg total) by mouth 3 (three) times daily., Disp: 21 tablet, Rfl: 0   oxyCODONE 10 MG TABS, Take 1 tablet (10 mg total) by mouth every 6 (six) hours as needed for severe pain., Disp: 90 tablet, Rfl: 0:   Allergies  Allergen Reactions   Other Nausea And Vomiting    Narcotics- pt states he does not like to take narcotics d/t makes him sick  :   Family History  Problem Relation Age of Onset   Diabetes Mother    Alzheimer's disease Mother    Diabetes Maternal Grandmother    Mesothelioma Father    Colon cancer Neg Hx    Stomach cancer Neg Hx   :   Social History   Socioeconomic History   Marital status: Divorced    Spouse name: Not on file   Number of children: 0   Years of education: Not on file   Highest education level: Not on file  Occupational History   Occupation: Secondary school teacher  Tobacco Use   Smoking status: Former    Packs/day: 0.50    Years: 40.00    Total pack years: 20.00    Types: Cigarettes    Quit date: 04/11/2014    Years since quitting: 7.9   Smokeless tobacco: Never  Vaping Use   Vaping Use: Never used  Substance and Sexual Activity   Alcohol use: Yes    Alcohol/week: 2.0 standard drinks of alcohol    Types: 2 Standard drinks or equivalent per week   Drug use: Not Currently    Comment: hx of 28 years ago - marijuana, acid, cocaine    Sexual activity: Yes  Other  Topics Concern   Not on file  Social History Narrative   Not on file   Social Determinants of Health   Financial Resource Strain: Not on file  Food Insecurity: Not on file  Transportation Needs: Not on file  Physical Activity: Not on file  Stress: Not on file  Social Connections: Not on file  Intimate Partner Violence: Not on file  :  Pertinent items are noted in HPI.  Exam: Blood pressure (!) 139/91, pulse 89, temperature 98.1 F (36.7 C), temperature source Temporal, resp. rate 17, height '5\' 7"'$  (1.702 m), weight 185 lb 1.6 oz (84 kg), SpO2 98 %. ECOG 0 General appearance: alert and cooperative appeared without distress. Head: atraumatic without any abnormalities. Eyes: conjunctivae/corneas clear. PERRL.  Sclera anicteric. Throat:  lips, mucosa, and tongue normal; without oral thrush or ulcers. Resp: clear to auscultation bilaterally without rhonchi, wheezes or dullness to percussion. Cardio: regular rate and rhythm, S1, S2 normal, no murmur, click, rub or gallop GI: soft, non-tender; bowel sounds normal; no masses,  no organomegaly Skin: Skin color, texture, turgor normal. No rashes or lesions Lymph nodes: Cervical, supraclavicular, and axillary nodes normal. Neurologic: Grossly normal without any motor, sensory or deep tendon reflexes. Musculoskeletal: No joint deformity or effusion.    NM PET Image Initial (PI) Skull Base To Thigh  Result Date: 02/17/2022 CLINICAL DATA:  Initial treatment strategy for lytic destructive bone lesions. EXAM: NUCLEAR MEDICINE PET SKULL BASE TO THIGH TECHNIQUE: 9.2 mCi F-18 FDG was injected intravenously. Full-ring PET imaging was performed from the skull base to thigh after the radiotracer. CT data was obtained and used for attenuation correction and anatomic localization. Fasting blood glucose: 93 mg/dl COMPARISON:  MRI abdomen, same day and chest CT 01/10/2022 FINDINGS: Mediastinal blood pool activity: SUV max 2.80 Liver activity: SUV max NA  NECK: No hypermetabolic lymph nodes in the neck. Incidental CT findings: Right maxillary sinus disease. CHEST: No hypermetabolic mediastinal or hilar nodes. No suspicious pulmonary nodules on the CT scan. Incidental CT findings: none ABDOMEN/PELVIS: No abnormal hypermetabolic activity within the liver, pancreas, adrenal glands, or spleen. No hypermetabolic lymph nodes in the abdomen or pelvis. There is mild asymmetric hypermetabolism in the peripheral zone of the prostate gland on the right side. SUV max is 4.99. Could not exclude the possibility of prostate cancer. Recommend correlation with PSA level. Incidental CT findings: Advanced cirrhotic changes involving the liver. The MRI shows small enhancing liver lesions indeterminate for HCC. Numerous small layering gallstones are noted the gallbladder. Renal calculi are noted bilaterally. Scattered aortic and iliac artery calcifications but no aneurysm. SKELETON: The left fourth rib lesion is hypermetabolic with SUV max of 3.41. The mixed lytic and sclerotic bone lesion causing spinal canal compromise at L4 demonstrates mild hypermetabolism with SUV max of 3.31. I do not see any other definite lesions. Biopsy of the rib lesion may be indicated. Incidental CT findings: none IMPRESSION: 1. Hypermetabolic left fourth rib lesion and L4 vertebral body lesion consistent with metastatic disease. 2. Mild hypermetabolism in the peripheral zone of the prostate gland on the right side. Could not exclude prostate cancer. Recommend correlation with PSA level. 3. No other primary neoplastic site is identified. The small liver lesions seen on the MRI are not hypermetabolic the PET scan. 4. Stable cirrhotic changes involving the liver. 5. Other incidental findings including gallstones and renal calculi. Electronically Signed   By: Marijo Sanes M.D.   On: 02/17/2022 16:08   MR ABDOMEN WWO CONTRAST  Result Date: 02/16/2022 CLINICAL DATA:  Hepatic cirrhosis, suspected malignancy  EXAM: MRI ABDOMEN WITHOUT AND WITH CONTRAST TECHNIQUE: Multiplanar multisequence MR imaging of the abdomen was performed both before and after the administration of intravenous contrast. CONTRAST:  10 mL Eovist COMPARISON:  CT abdomen and pelvis 12/16/2018 FINDINGS: Lower chest: No acute findings. Hepatobiliary: Nodular contour of the liver consistent with cirrhosis. Also evidence of hepatic steatosis. There are multifocal amorphous areas of early arterial phase hyperenhancement most prominent in the right hepatic lobe, largest areas include approximately 2.4 cm area peripherally in segment 5, 1.8 cm posteriorly in segment 7, and 1.7 cm focus in the central right hepatic lobe just to the right of the IVC. All of these areas are essentially isointense on the more delayed postcontrast sequences, with no  washout lesion identified and no enhancing capsule appreciated. No significant corresponding hyperintense T2 or DWI signal. Multiple small gallstones. No gallbladder wall thickening or surrounding edema. No biliary ductal dilatation. Pancreas: No mass, inflammatory changes, or other parenchymal abnormality identified. Spleen:  Within normal limits in size and appearance. Adrenals/Urinary Tract: No masses identified. No evidence of hydronephrosis. Stomach/Bowel: No evidence of bowel obstruction. Vascular/Lymphatic: No pathologically enlarged lymph nodes identified. No abdominal aortic aneurysm demonstrated. Other:  No ascites. Musculoskeletal: There is an approximately 3.2 x 2.7 cm suspicious enhancing lesion in the L4 vertebral body which appears expansile dorsally causing spinal canal stenosis. IMPRESSION: 1. Enhancing lesion in the L4 vertebral body suspicious for metastasis, which appears expansile dorsally causing spinal canal stenosis. Consider whole-body bone scan and/or MRI lumbar spine with contrast as indicated. 2. Hepatic cirrhosis with multifocal hepatic arterial phase enhancing areas as described, LI-RADS  3. Consider repeat imaging in 3-6 months. 3. Hepatic steatosis. 4. Cholelithiasis. Electronically Signed   By: Ofilia Neas M.D.   On: 02/16/2022 14:52    Assessment and Plan:    61 year old with:  1.  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  He was found to have a PSA of 26 and previous FDG PET scan showed hypermetabolic activity of L4 as well as fourth rib lesion.  The natural course of this disease and treatment options were discussed at this time.  Given the advanced nature of his disease systemic therapy remains the main treatment modality at this time and should be initiated immediately.  Androgen deprivation therapy remains the cornerstone of this treatment.  Complications associated with this treatment including weight gain, hot flashes and sexual dysfunction  Therapy escalation options were discussed at this time and he will be a good candidate for it.  These options including androgen receptor pathway inhibitors such as abiraterone or enzalutamide versus Taxotere chemotherapy.  Complications and risks and benefits of these options were reviewed.  We have discussed a multitude of studies that led to the approval of these drugs that cause improvement in overall survival and at least double-digit months.  After discussion today, he is agreeable to proceed with with Zytiga 1000 mg daily and prednisone 5 mg.  2.  Local therapy: The role for radiation therapy to his isolated metastatic lesions as well as primary prostate were discussed at this time.  He could potentially benefit given his oligometastatic disease for treatment for both his limited metastatic disease as well as treating the primary.  Before proceeding without he would benefit from PSMA PET scan for more accurate staging.  We will make the appropriate referral for radiation as well.  3.  Bone directed therapy: I recommended  calcium and vitamin D supplements and potentially Xgeva to be added in the future.  4.  Genetic counseling considerations: Given the advanced nature of his prostate cancer he would benefit from genetic counseling.  He would also would benefit from genomic testing which could be a potential therapeutic target in the future especially if he harbors the appropriate mutation.  5.  Androgen deprivation: He will receive [name] today and repeated in 4 months.  After that he will switch to Eligard every 6 months.  Duration of therapy is indefinite for the time being.  He is agreeable to proceed.  6 Follow-up: Will be in 4 weeks for repeat evaluation.   60  minutes were dedicated to this visit. The time was spent  on reviewing laboratory data, imaging studies, discussing treatment options, discussing pathology results and answering questions regarding future plan.      A copy of this consult has been forwarded to the requesting physician.

## 2022-03-09 NOTE — Telephone Encounter (Addendum)
Oral Oncology Patient Advocate Encounter   Received notification that prior authorization for Abiraterone [facility]) is required.   PA submitted on 03/09/2022 to [facility] via phone [phone] Case ID: [id number] Status is pending     Lady Deutscher, [facility] [place] Patient [place] Patient Advocate Team Direct Number: [phone]  Fax: [phone]

## 2022-03-14 ENCOUNTER — Inpatient Hospital Stay: Payer: Commercial Managed Care - HMO

## 2022-03-14 ENCOUNTER — Encounter (HOSPITAL_COMMUNITY): Payer: Self-pay

## 2022-03-14 NOTE — Telephone Encounter (Signed)
Oral Oncology Pharmacist Encounter  Received new prescription for abiraterone (Zytiga) for the treatment of castration sensitive prostate cancer in conjunction with prednisone, planned duration until disease progression or unac ceptable toxicity.  Labs from 02/11/22 assessed, no interventions needed. Prescription dose and frequency assessed.  Current medication list in Epic reviewed, DDIs with Zytiga identified: none  Evaluated chart and no patient barriers to medication adherence noted.   Patient agreement for treatment documented in MD note on 03/09/22.  Prescription has been e-scribed to the [facility] for benefits analysis and approval.  Oral Oncology Clinic will continue to follow for insurance authorization, copayment issues, initial counseling and start date.  Drema Halon, PharmD Hematology/Oncology Clinical Pharmacist [facility] [phone] 03/14/2022 10:48 AM

## 2022-03-14 NOTE — Telephone Encounter (Signed)
Oral Oncology Patient Advocate Encounter  Called to check status of prior authorization for Abiraterone through [facility].  Additonal clinical questions submitted via e-fax to [phone] Status is pending.   Phone: [phone] Fax: [phone]  Case ID: [id number]  I will continue to check status until final determination.   Dylan [name], CPhT-Adv Pharmacy Patient Advocate Specialist [place] Patient Advocate Team Direct Number: [phone]  Fax: [phone]

## 2022-03-14 NOTE — Progress Notes (Signed)
[facility] Work  Initial Assessment   Dylan [name] is a 60 y.o. year old male contacted by phone. Clinical Social Work assessed psychosocial needs as a new patient of Dr. Hazeline [name].   SDOH (Social Determinants of Health) assessments performed: Yes SDOH Interventions    Flowsheet Row Most Recent Value  SDOH Interventions   Food Insecurity Interventions Other (Comment)  Financial Strain Interventions Other (Comment)  Housing Interventions Intervention Not Indicated  Transportation Interventions Intervention Not Indicated       SDOH Screenings   Alcohol Screen: Not on file  Depression (GYB6-3): Not on file  Financial Resource Strain: Medium Risk (03/14/2022)   Overall Financial Resource Strain (CARDIA)    Difficulty of Paying Living Expenses: Somewhat hard  Food Insecurity: Food Insecurity Present (03/14/2022)   Hunger Vital Sign    Worried About Running Out of Food in the Last Year: Sometimes true    Ran Out of Food in the Last Year: Sometimes true  Housing: Low Risk  (03/14/2022)   Housing    Last Housing Risk Score: 0  Physical Activity: Not on file  Social Connections: Not on file  Stress: Not on file  Tobacco Use: Medium Risk (03/08/2022)   Patient History    Smoking Tobacco Use: Former    Smokeless Tobacco Use: Never    Passive Exposure: Not on file  Transportation Needs: No Transportation Needs (03/14/2022)   PRAPARE - Hydrologist (Medical): No    Lack of Transportation (Non-Medical): No     Distress Screen completed: No     No data to display            Family/Social Information:  Housing Arrangement: patient lives alone. Family members/support persons in your life? Family and Friends.  Patient has three stepchildren that are supportive.  He stated he does not wish to be a burden to them, however.  He wishes to remain as independent as possible. Transportation concerns: no  Employment: Working full time at [employer]. Income source: Employment Financial concerns: Yes, current concerns Type of concern: Food and future medication.  He stated he will work with his pharmacist. Food access concerns: Cost of food.  Offered Pension scheme manager. Religious or spiritual practice: Not known Services Currently in place:  None  Coping/ Adjustment to diagnosis: Patient understands treatment plan and what happens next? yes Concerns about diagnosis and/or treatment: Paying for medications. Patient reported stressors: financial Hopes and/or priorities: Eating more healthy. "Not let the cancer keep me from living." Patient enjoys time with family/ friends Current coping skills/ strengths: Capable of independent living , Communication skills , General fund of knowledge , Motivation for treatment/growth , Supportive family/friends , and Work skills     SUMMARY: Current SDOH Barriers:  Medication procurement  Clinical Social Work Clinical Goal(s):  Explore community resource options for unmet needs related to:  Financial Strain   Interventions: Discussed common feeling and emotions when being diagnosed with cancer, and the importance of support during treatment Informed patient of the support team roles and support services at [facility] Provided [facility] contact information and encouraged patient to call with any questions or concerns Provided patient with information about food pantries and possible financial assistance.   Follow Up Plan: Patient will contact CSW with any support or resource needs Patient verbalizes understanding of plan: Yes    Rodman Pickle [name], LCSW

## 2022-03-15 ENCOUNTER — Telehealth: Payer: Self-pay | Admitting: *Deleted

## 2022-03-15 ENCOUNTER — Other Ambulatory Visit: Payer: Self-pay | Admitting: Oncology

## 2022-03-15 ENCOUNTER — Other Ambulatory Visit (HOSPITAL_COMMUNITY): Payer: Self-pay

## 2022-03-15 ENCOUNTER — Telehealth: Payer: Self-pay | Admitting: Pharmacy Technician

## 2022-03-15 MED ORDER — XTANDI 40 MG PO CAPS
160.0000 mg | ORAL_CAPSULE | Freq: Every day | ORAL | 0 refills | Status: DC
  Filled 2022-03-15: qty 120, 30d supply, fill #0

## 2022-03-15 MED ORDER — OXYCODONE HCL 10 MG PO TABS
10.0000 mg | ORAL_TABLET | Freq: Four times a day (QID) | ORAL | 0 refills | Status: DC | PRN
Start: 2022-03-15 — End: 2022-04-12
  Filled 2022-03-15: qty 90, 23d supply, fill #0

## 2022-03-15 NOTE — Telephone Encounter (Signed)
Returned patient's phone call, spoke with patient 

## 2022-03-15 NOTE — Telephone Encounter (Signed)
Dylan [name] states Dylan [name] was unable to fill his Oxycodone 10 mg on 03/08/22 as they do not have any in stock. RN confirmed with [facility] pharmacist that it was NOT filled. Prescription cancelled at [facility].   He is requesting refill to be sent to [facility]

## 2022-03-15 NOTE — Telephone Encounter (Signed)
Oral Oncology Patient Advocate Encounter  Prior Authorization for Abiraterone has been approved.    PA# [id number] Effective dates: 03/13/2022 through 03/14/2023  Patients co-pay is $150.    Lady Deutscher, CPhT-Adv Pharmacy Patient Advocate Specialist [facility] Patient Advocate Team Direct Number: [phone]  Fax: [phone]

## 2022-03-16 ENCOUNTER — Other Ambulatory Visit (HOSPITAL_COMMUNITY): Payer: Self-pay

## 2022-03-16 NOTE — Telephone Encounter (Addendum)
Oral Oncology Patient Advocate Encounter  Prior Authorization for Dylan [name] has been approved.    PA# [id number] (capsules) PA# [id number] (tablets pending 7/21) Effective dates: 03/15/2022 through 03/16/2023  Patients co-pay is $150.    Lady Deutscher, CPhT-Adv Pharmacy Patient Advocate Specialist [place] Patient Advocate Team Direct Number: [phone]  Fax: [phone]

## 2022-03-16 NOTE — Progress Notes (Signed)
Histology and Location of Primary Cancer: Prostate Ca  Sites of Visceral and Bony Metastatic Disease: Lumbar spine (L4)  Location(s) of Symptomatic Metastases: L4  NM PET Image Initial (PI) Skull Base To Thigh   Result Date: 02/17/2022 CLINICAL DATA:  Initial treatment strategy for lytic destructive bone lesions. EXAM: NUCLEAR MEDICINE PET SKULL BASE TO THIGH TECHNIQUE: 9.2 mCi F-18 FDG was injected intravenously. Full-ring PET imaging was performed from the skull base to thigh after the radiotracer. CT data was obtained and used for attenuation correction and anatomic localization. Fasting blood glucose: 93 mg/dl COMPARISON:  MRI abdomen, same day and chest CT 01/10/2022 FINDINGS: Mediastinal blood pool activity: SUV max 2.80 Liver activity: SUV max NA NECK: No hypermetabolic lymph nodes in the neck. Incidental CT findings: Right maxillary sinus disease. CHEST: No hypermetabolic mediastinal or hilar nodes. No suspicious pulmonary nodules on the CT scan. Incidental CT findings: none ABDOMEN/PELVIS: No abnormal hypermetabolic activity within the liver, pancreas, adrenal glands, or spleen. No hypermetabolic lymph nodes in the abdomen or pelvis. There is mild asymmetric hypermetabolism in the peripheral zone of the prostate gland on the right side. SUV max is 4.99. Could not exclude the possibility of prostate cancer. Recommend correlation with PSA level. Incidental CT findings: Advanced cirrhotic changes involving the liver. The MRI shows small enhancing liver lesions indeterminate for HCC. Numerous small layering gallstones are noted the gallbladder. Renal calculi are noted bilaterally. Scattered aortic and iliac artery calcifications but no aneurysm. SKELETON: The left fourth rib lesion is hypermetabolic with SUV max of 7.71. The mixed lytic and sclerotic bone lesion causing spinal canal compromise at L4 demonstrates mild hypermetabolism with SUV max of 3.31. I do not see any other definite lesions. Biopsy  of the rib lesion may be indicated. Incidental CT findings: none IMPRESSION: 1. Hypermetabolic left fourth rib lesion and L4 vertebral body lesion consistent with metastatic disease. 2. Mild hypermetabolism in the peripheral zone of the prostate gland on the right side. Could not exclude prostate cancer. Recommend correlation with PSA level. 3. No other primary neoplastic site is identified. The small liver lesions seen on the MRI are not hypermetabolic the PET scan. 4. Stable cirrhotic changes involving the liver. 5. Other incidental findings including gallstones and renal calculi. Electronically Signed   By: Marijo Sanes M.D.   On: 02/17/2022 16:08    MR ABDOMEN WWO CONTRAST   Result Date: 02/16/2022 CLINICAL DATA:  Hepatic cirrhosis, suspected malignancy EXAM: MRI ABDOMEN WITHOUT AND WITH CONTRAST TECHNIQUE: Multiplanar multisequence MR imaging of the abdomen was performed both before and after the administration of intravenous contrast. CONTRAST:  10 mL Eovist COMPARISON:  CT abdomen and pelvis 12/16/2018 FINDINGS: Lower chest: No acute findings. Hepatobiliary: Nodular contour of the liver consistent with cirrhosis. Also evidence of hepatic steatosis. There are multifocal amorphous areas of early arterial phase hyperenhancement most prominent in the right hepatic lobe, largest areas include approximately 2.4 cm area peripherally in segment 5, 1.8 cm posteriorly in segment 7, and 1.7 cm focus in the central right hepatic lobe just to the right of the IVC. All of these areas are essentially isointense on the more delayed postcontrast sequences, with no washout lesion identified and no enhancing capsule appreciated. No significant corresponding hyperintense T2 or DWI signal. Multiple small gallstones. No gallbladder wall thickening or surrounding edema. No biliary ductal dilatation. Pancreas: No mass, inflammatory changes, or other parenchymal abnormality identified. Spleen:  Within normal limits in size and  appearance. Adrenals/Urinary Tract: No masses identified. No evidence  of hydronephrosis. Stomach/Bowel: No evidence of bowel obstruction. Vascular/Lymphatic: No pathologically enlarged lymph nodes identified. No abdominal aortic aneurysm demonstrated. Other:  No ascites. Musculoskeletal: There is an approximately 3.2 x 2.7 cm suspicious enhancing lesion in the L4 vertebral body which appears expansile dorsally causing spinal canal stenosis. IMPRESSION: 1. Enhancing lesion in the L4 vertebral body suspicious for metastasis, which appears expansile dorsally causing spinal canal stenosis. Consider whole-body bone scan and/or MRI lumbar spine with contrast as indicated. 2. Hepatic cirrhosis with multifocal hepatic arterial phase enhancing areas as described, LI-RADS 3. Consider repeat imaging in 3-6 months. 3. Hepatic steatosis. 4. Cholelithiasis.   Past/Anticipated chemotherapy by medical oncology, if any:  Dr. Alen Blew 1.  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  He was found to have a PSA of 26 and previous FDG PET scan showed hypermetabolic activity of L4 as well as fourth rib lesion.   The natural course of this disease and treatment options were discussed at this time.  Given the advanced nature of his disease systemic therapy remains the main treatment modality at this time and should be initiated immediately.  Androgen deprivation therapy remains the cornerstone of this treatment.  Complications associated with this treatment including weight gain, hot flashes and sexual dysfunction   Therapy escalation options were discussed at this time and he will be a good candidate for it.  These options including androgen receptor pathway inhibitors such as abiraterone or enzalutamide versus Taxotere chemotherapy.  Complications and risks and benefits of these options were reviewed.  We have discussed a multitude of  studies that led to the approval of these drugs that cause improvement in overall survival and at least double-digit months.   After discussion today, he is agreeable to proceed with with Zytiga 1000 mg daily and prednisone 5 mg.   2.  Local therapy: The role for radiation therapy to his isolated metastatic lesions as well as primary prostate were discussed at this time.  He could potentially benefit given his oligometastatic disease for treatment for both his limited metastatic disease as well as treating the primary.  Before proceeding without he would benefit from PSMA PET scan for more accurate staging.  We will make the appropriate referral for radiation as well.   3.  Bone directed therapy: I recommended calcium and vitamin D supplements and potentially Xgeva to be added in the future.   4.  Genetic counseling considerations: Given the advanced nature of his prostate cancer he would benefit from genetic counseling.  He would also would benefit from genomic testing which could be a potential therapeutic target in the future especially if he harbors the appropriate mutation.   5.  Androgen deprivation: He will receive [name] today and repeated in 4 months.  After that he will switch to Eligard every 6 months.  Duration of therapy is indefinite for the time being.  He is agreeable to proceed.   6 Follow-up: Will be in 4 weeks for repeat evaluation.   Pain on a scale of 0-10 is:  6/10 fourth rib left side   IPSS:  22 Shim:  16  If Spine Met(s), symptoms, if any, include: Bowel/Bladder retention or incontinence (please describe): No Numbness or weakness in extremities (please describe): right hand numbness Current Decadron regimen, if applicable: No  Ambulatory status? Walker? Wheelchair?: No  SAFETY ISSUES: Prior radiation? No Pacemaker/ICD? No Possible current pregnancy? Male Is the patient on methotrexate?  No  Current Complaints / other details:

## 2022-03-16 NOTE — Telephone Encounter (Signed)
Oral Oncology Patient Advocate Encounter   Received notification that prior authorization for Gillermina Phy is required.   PA submitted on 03/16/2022 via phone to [place] ID: [id number]  Status is pending     Lady Deutscher, [place] Patient Advocate Specialist [place] Patient Advocate Team Direct Number: [phone]  Fax: [phone]

## 2022-03-17 ENCOUNTER — Other Ambulatory Visit (HOSPITAL_COMMUNITY): Payer: Self-pay

## 2022-03-17 ENCOUNTER — Ambulatory Visit
Admission: RE | Admit: 2022-03-17 | Discharge: 2022-03-17 | Disposition: A | Discharge status: Home / Self Care | Payer: Commercial Managed Care - HMO | Source: Ambulatory Visit | Attending: Radiation Oncology | Admitting: Radiation Oncology

## 2022-03-17 ENCOUNTER — Telehealth: Payer: Self-pay | Admitting: Pharmacy Technician

## 2022-03-17 ENCOUNTER — Telehealth: Payer: Self-pay | Admitting: *Deleted

## 2022-03-17 ENCOUNTER — Encounter: Payer: Self-pay | Admitting: Physician Assistant

## 2022-03-17 ENCOUNTER — Other Ambulatory Visit: Payer: Self-pay

## 2022-03-17 VITALS — Ht 67.0 in | Wt 185.0 lb

## 2022-03-17 DIAGNOSIS — C61 Malignant neoplasm of prostate: Secondary | ICD-10-CM

## 2022-03-17 DIAGNOSIS — C7951 Secondary malignant neoplasm of bone: Secondary | ICD-10-CM

## 2022-03-17 MED ORDER — ENZALUTAMIDE 40 MG PO TABS
160.0000 mg | ORAL_TABLET | Freq: Every day | ORAL | 0 refills | Status: DC
  Filled 2022-03-17 – 2022-03-18 (×2): qty 120, 30d supply, fill #0

## 2022-03-17 NOTE — Telephone Encounter (Signed)
Message received from [employer].  Pending call from Case worker to advise what agencies patient can get injection from.  "Hi Dylan [name], This pt case worker is going to call you regarding pt's injection. This drug is out of network with our facility and she stated arrangement will need to be made for pt to receive drug at an in network facility, she will provide the facilities to you."

## 2022-03-17 NOTE — Telephone Encounter (Addendum)
Oral Chemotherapy Pharmacist Encounter    I spoke with patient for overview of: Xtandi for the treatment castration-sensitive prostate cancer, planned duration until disease progression or unacceptable toxicity.   Counseled patient on administration, dosing, side effects, monitoring, drug-food interactions, safe handling, storage, and disposal.  Patient will take Xtandi '40mg'$  capsules, 4 capsules ('160mg'$ ) by mouth once daily without regard to food.  Xtandi start date: 03/18/2022  Adverse effects include but are not limited to: peripheral edema, GI upset, hypertension, hot flashes, fatigue, falls/fractures, and arthralgias.   Patient instructed about small risk of seizures with Xtandi treatment.  Reviewed with patient importance of keeping a medication schedule and plan for any missed doses. No barriers to medication adherence identified.  Medication reconciliation performed and medication/allergy list updated.  Insurance authorization for Gillermina Phy has been obtained. Test claim at the pharmacy revealed copayment $150 for 1st fill of 30 days. Copay card obtained to have cost of $0.  Patient will pick up xtandi medication on 03/18/2022 from [facility].  Patient informed the pharmacy will reach out 5-7 days prior to needing next fill of Xtandi to coordinate continued medication acquisition to prevent break in therapy.  All questions answered.  Dylan [name] voiced understanding and appreciation.   Medication education handout placed in mail for patient. Patient knows to call the office with questions or concerns. Oral Chemotherapy Clinic phone number provided to patient.   Drema Halon, PharmD Hematology/Oncology Clinical Pharmacist [facility] [phone] 03/17/2022   12:34 PM

## 2022-03-17 NOTE — Telephone Encounter (Signed)
Oral Oncology Patient Advocate Encounter   Was successful in obtaining a copay card for [facility].  This copay card will make the patients copay $0.  I have spoken with the patient.    The billing information is as follows and has been shared with [facility].   RxBin: [id number] PCN: [id number] Member ID: [id number] Group ID: [id number]   Lady Deutscher, CPhT-Adv Pharmacy Patient Advocate Specialist [place] Patient Advocate Team Direct Number: [phone]  Fax: [phone]

## 2022-03-17 NOTE — Telephone Encounter (Signed)
Her contact info is: [facility] Nurse Case [employer]  Phone: [phone] ext. [phone]

## 2022-03-17 NOTE — Progress Notes (Signed)
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    ICD-10-CM   1. Prostate cancer metastatic to bone [facility])  C61    C79.51       HISTORY OF PRESENT ILLNESS: Dylan [name] is a 60 y.o. male seen at the request of Dr. Alen Blew.  He has a history of COPD, followed by Dr. Valeta Harms and was noted to have a left upper lobe opacity on a routine chest x-ray in November 2022.  He had a CT chest in December 2022 for further evaluation of the left upper lobe lung nodule and was found to have an expansile lesion in the lateral left fourth rib with an associated healed fracture.  A repeat CT chest on 01/10/2022 for follow-up of that lesion showed a stable size (4 cm) but increased internal architecture and cortical thickening, worrisome for malignancy.  There was also some hepatic heterogenicity, indicative of steatosis but recommendation was for further evaluation with MR abdomen to rule out hepatocellular carcinoma.  He was referred to medical oncology and met with Dede Query, PA-C on 02/11/2022.  A PSA was performed that day and noted to be significantly elevated at 26.1.  MRI abdomen was performed on 02/16/2022 and confirmed cirrhosis with multifocal hepatic arterial phase enhancement, LIRADS 3 but also noted a 3.2 x 2.7 cm expansile enhancing lesion in the L4 vertebral body causing spinal stenosis.  He had a PET scan that same day showing mild hypermetabolic activity in the L4 lesion (SUV max 3.31), hypermetabolism of the left fourth rib lesion (SUV max 5.36) and mild  hypermetabolism in the peripheral zone of the prostate on the right but no other definite hypermetabolic lesions.  The liver lesions noted on previous scans were not hypermetabolic.  He subsequently underwent CT-guided needle biopsy of the L4 lesion and marrow biopsy performed 03/04/2022 with final pathology confirming metastatic carcinoma consistent with metastatic prostatic adenocarcinoma.   He met with Dr. Alen Blew on 03/09/2022 and was started on [place] ADT, which he is tolerating well.  He has also been prescribed Xtandi but has not yet started this medication.  A PSMA PET scan has been ordered but has not yet been scheduled.  He has been kindly referred to [place] today for consideration of local therapy to treat the prostate and oligometastatic lesions for more durable disease control.  PREVIOUS RADIATION THERAPY: No  PAST MEDICAL HISTORY:  Past Medical History:  Diagnosis Date   Cholelithiasis    Cirrhosis ([place])    COPD (chronic obstructive pulmonary disease) ([place])    1 year   Gallstones    Hepatitis C    3 years   PONV (postoperative nausea and vomiting)    Prostate cancer metastatic to bone ([place]) 03/08/2022   Smoker    1/2 ppd for 40 years      PAST SURGICAL HISTORY: Past Surgical History:  Procedure Laterality Date   COLONOSCOPY WITH PROPOFOL N/A 12/11/2014   Procedure: COLONOSCOPY WITH PROPOFOL;  Surgeon: Inda Castle, MD;  Location: [place];  Service: Endoscopy;  Laterality: N/A;   ESOPHAGEAL BANDING  N/A 12/11/2014   Procedure: ESOPHAGEAL BANDING;  Surgeon: Inda Castle, MD;  Location: [place];  Service: Endoscopy;  Laterality: N/A;   ESOPHAGOGASTRODUODENOSCOPY N/A 06/06/2017   Procedure: ESOPHAGOGASTRODUODENOSCOPY (EGD);  Surgeon: Yetta Flock, MD;  Location: [facility];  Service: Gastroenterology;  Laterality: N/A;   ESOPHAGOGASTRODUODENOSCOPY (EGD) WITH PROPOFOL N/A 12/11/2014   Procedure: ESOPHAGOGASTRODUODENOSCOPY (EGD) WITH PROPOFOL;  Surgeon: Inda Castle, MD;  Location: [place];  Service: Endoscopy;  Laterality: N/A;   KNEE ARTHROSCOPY Right     FAMILY HISTORY:  Family History  Problem Relation Age of Onset   Diabetes Mother    Alzheimer's disease Mother    Diabetes Maternal Grandmother    Mesothelioma Father    Colon cancer Neg Hx    Stomach cancer Neg Hx     SOCIAL HISTORY:  Social History   Socioeconomic History   Marital status: Divorced    Spouse name: Not on file   Number of children: 0   Years of education: Not on file   Highest education level: Not on file  Occupational History   Occupation: Secondary school teacher  Tobacco Use   Smoking status: Former    Packs/day: 0.50    Years: 40.00    Total pack years: 20.00    Types: Cigarettes    Quit date: 04/11/2014    Years since quitting: 7.9   Smokeless tobacco: Never  Vaping Use   Vaping Use: Never used  Substance and Sexual Activity   Alcohol use: Yes    Alcohol/week: 2.0 standard drinks of alcohol    Types: 2 Standard drinks or equivalent per week   Drug use: Not Currently    Comment: hx of 28 years ago - marijuana, acid, cocaine    Sexual activity: Yes  Other Topics Concern   Not on file  Social History Narrative   Not on file   Social Determinants of Health   Financial Resource Strain: Medium Risk (03/14/2022)   Overall Financial Resource Strain (CARDIA)    Difficulty of Paying Living Expenses: Somewhat hard  Food Insecurity: Food Insecurity Present (03/14/2022)   Hunger Vital Sign    Worried About Running Out of Food in the Last Year: Sometimes true    Ran Out of Food in the Last Year: Sometimes true  Transportation Needs: No Transportation Needs (03/14/2022)   PRAPARE - Hydrologist (Medical): No    Lack of Transportation (Non-Medical): No  Physical Activity: Not on file  Stress: Not on file  Social Connections: Not on file  Intimate Partner Violence: Not on file    ALLERGIES: Other  MEDICATIONS:  Current  Outpatient Medications  Medication Sig Dispense Refill   albuterol (PROVENTIL) (2.5 MG/3ML) 0.083% nebulizer solution Take 3 mLs (2.5 mg total) by nebulization every 6 (six) hours as needed for wheezing or shortness of breath. 75 mL 12   benzonatate (TESSALON) 100 MG capsule Take 1 capsule (100 mg total) by mouth 3 (three) times daily as needed for cough. 30 capsule 0   Budeson-Glycopyrrol-Formoterol (BREZTRI AEROSPHERE) 160-9-4.8 MCG/ACT AERO Inhale 2 puffs into the lungs in the morning and at bedtime. 10.7 g 6   Chlorphen-Phenyleph-ASA (ALKA-SELTZER PLUS COLD) 2-7.8-325 MG TBEF Take 2 tablets by mouth 2 (two) times daily as needed (cold symptoms).     cyclobenzaprine (FLEXERIL) 10 MG tablet Take 1 tablet (10 mg total) by mouth 2 (two) times daily between meals as needed for muscle spasms. 30 tablet 0   enzalutamide Gillermina Phy)  40 MG tablet Take 4 tablets (160 mg total) by mouth daily. 120 tablet 0   guaiFENesin (MUCINEX) 600 MG 12 hr tablet Take 1 tablet (600 mg total) by mouth 2 (two) times daily. (Patient taking differently: Take 600 mg by mouth 2 (two) times daily as needed.) 20 tablet 0   ibuprofen (ADVIL) 800 MG tablet Take 1 tablet (800 mg total) by mouth 3 (three) times daily. 21 tablet 0   Oxycodone HCl 10 MG TABS Take 1 tablet by mouth every 6 hours as needed. 90 tablet 0   No current facility-administered medications for this encounter.    REVIEW OF SYSTEMS:  On review of systems, the patient reports that he is doing well overall.  He denies any chest pain, shortness of breath, cough, fevers, chills, night sweats, unintended weight changes.  He denies any dysuria, gross hematuria, diarrhea, constipation, abdominal pain, nausea or vomiting.  He does have progressive LUTS with a current IPSS score of 22, indicating severe urinary symptoms.  He reports a weak flow of stream, urgency, intermittency, nocturia x3 and sensation of incomplete bladder emptying.  He does not have a urologist and is not  currently on any alpha blockers or medications for his LUTS.  His SHIM score is 16, indicating mild erectile dysfunction.  He has a mobile, non-tender "knot" in his scrotum that has been present for several years and only minimally changed over time.  He reports some mild left chest wall/rib discomfort but reports that this is not interfering with his normal day-to-day activities and does respond to Tylenol/Advil as needed.  He denies any new musculoskeletal or joint aches or pains. A complete review of systems is obtained and is otherwise negative.    PHYSICAL EXAM:  Wt Readings from Last 3 Encounters:  03/17/22 185 lb (83.9 kg)  03/09/22 185 lb 1.6 oz (84 kg)  03/04/22 185 lb 13.6 oz (84.3 kg)   Temp Readings from Last 3 Encounters:  03/09/22 98.1 F (36.7 C) (Temporal)  03/04/22 97.9 F (36.6 C) (Oral)  02/11/22 97.9 F (36.6 C) (Temporal)   BP Readings from Last 3 Encounters:  03/09/22 (!) 139/91  03/04/22 (!) 140/97  02/11/22 131/90   Pulse Readings from Last 3 Encounters:  03/09/22 89  03/04/22 81  02/11/22 89   Pain Assessment Pain Score: 6  Pain Loc: Rib Cage (4th rib left side)/10  Unable to assess due to telephone consult visit format.  KPS = 100  100 - Normal; no complaints; no evidence of disease. 90   - Able to carry on normal activity; minor signs or symptoms of disease. 80   - Normal activity with effort; some signs or symptoms of disease. 32   - Cares for self; unable to carry on normal activity or to do active work. 60   - Requires occasional assistance, but is able to care for most of his personal needs. 50   - Requires considerable assistance and frequent medical care. 55   - Disabled; requires special care and assistance. 73   - Severely disabled; hospital admission is indicated although death not imminent. 48   - Very sick; hospital admission necessary; active supportive treatment necessary. 10   - Moribund; fatal processes progressing rapidly. 0      - Dead  Karnofsky DA, Abelmann WH, Craver LS and Burchenal [facility] [phone]) The use of the nitrogen mustards in the palliative treatment of carcinoma: with particular reference to bronchogenic carcinoma Cancer 1 [id number]  LABORATORY DATA:  Lab Results  Component Value Date   WBC 4.1 03/04/2022   HGB 15.4 03/04/2022   HCT 46.7 03/04/2022   MCV 90.7 03/04/2022   PLT 178 03/04/2022   Lab Results  Component Value Date   NA 141 02/11/2022   K 3.9 02/11/2022   CL 108 02/11/2022   CO2 26 02/11/2022   Lab Results  Component Value Date   ALT 36 02/11/2022   AST 35 02/11/2022   ALKPHOS 66 02/11/2022   BILITOT 0.8 02/11/2022     RADIOGRAPHY: CT BONE MARROW BIOPSY & ASPIRATION  Result Date: 03/04/2022 INDICATION: 61 year old male with FDG avid lytic lesion, referred for targeted biopsy of L4 and bone marrow biopsy EXAM: CT BONE MARROW BIOPSY AND ASPIRATION; CT BIOPSY CORE BONE DEEP MEDICATIONS: None. ANESTHESIA/SEDATION: Moderate (conscious) sedation was employed during this procedure. A total of Versed 2.0 mg and Fentanyl 100 mcg was administered intravenously. Moderate Sedation Time: 22 minutes. The patient's level of consciousness and vital signs were monitored continuously by radiology nursing throughout the procedure under my direct supervision. FLUOROSCOPY TIME:  CT COMPLICATIONS: None PROCEDURE: Informed written consent was obtained from the patient after a thorough discussion of the procedural risks, benefits and alternatives. All questions were addressed. Maximal Sterile Barrier Technique was utilized including caps, mask, sterile gowns, sterile gloves, sterile drape, hand hygiene and skin antiseptic. A timeout was performed prior to the initiation of the procedure. Patient position prone position on CT gantry table. Scout CT of the pelvis was performed for surgical planning purposes. We placed targets for both the L4 level as well as a bone marrow biopsy of the pelvis. The posterior pelvis was  prepped with Chlorhexidine in a sterile fashion, and a sterile drape was applied covering the operative field. A sterile gown and sterile gloves were used for the procedure. Local anesthesia was provided with 1% Lidocaine. Posterior left iliac bone was targeted for biopsy. The skin and subcutaneous tissues were infiltrated with 1% lidocaine without epinephrine. A small stab incision was made with an 11 blade scalpel, and an 11 gauge Murphy needle was advanced with CT guidance to the posterior cortex. Manual forced was used to advance the needle through the posterior cortex and the stylet was removed. A bone marrow aspirate was retrieved and passed to a cytotechnologist in the room. The Murphy needle was then advanced without the stylet for a core biopsy. The core biopsy was retrieved and also passed to a cytotechnologist. We then proceeded with L4 targeted biopsy. Using CT guidance, 11 gauge Murphy needle was advanced through a trans pedicular lateral approach to the lytic lesion of L4. Once we confirmed needle tip position, coaxial core was acquired with 13 gauge device and then 11 gauge core was acquired. Samples placed into formalin. All needles were removed. Manual pressure was used for hemostasis and sterile dressings were placed. No complications were encountered no significant blood loss was encountered. Patient tolerated the procedure well and remained hemodynamically stable throughout. IMPRESSION: Status post CT-guided bone marrow biopsy, as well as CT-guided biopsy of L4 lytic lesion. Signed, Dulcy Fanny. Nadene Rubins, RPVI [facility] Radiology Electronically Signed   By: Corrie Mckusick D.O.   On: 03/04/2022 10:42   CT BONE TROCAR/NEEDLE BIOPSY DEEP  Result Date: 03/04/2022 INDICATION: 61 year old male with FDG avid lytic lesion, referred for targeted biopsy of L4 and bone marrow biopsy EXAM: CT BONE MARROW BIOPSY AND ASPIRATION; CT BIOPSY CORE BONE DEEP  MEDICATIONS: None. ANESTHESIA/SEDATION: Moderate (conscious)  sedation was employed during this procedure. A total of Versed 2.0 mg and Fentanyl 100 mcg was administered intravenously. Moderate Sedation Time: 22 minutes. The patient's level of consciousness and vital signs were monitored continuously by radiology nursing throughout the procedure under my direct supervision. FLUOROSCOPY TIME:  CT COMPLICATIONS: None PROCEDURE: Informed written consent was obtained from the patient after a thorough discussion of the procedural risks, benefits and alternatives. All questions were addressed. Maximal Sterile Barrier Technique was utilized including caps, mask, sterile gowns, sterile gloves, sterile drape, hand hygiene and skin antiseptic. A timeout was performed prior to the initiation of the procedure. Patient position prone position on CT gantry table. Scout CT of the pelvis was performed for surgical planning purposes. We placed targets for both the L4 level as well as a bone marrow biopsy of the pelvis. The posterior pelvis was prepped with Chlorhexidine in a sterile fashion, and a sterile drape was applied covering the operative field. A sterile gown and sterile gloves were used for the procedure. Local anesthesia was provided with 1% Lidocaine. Posterior left iliac bone was targeted for biopsy. The skin and subcutaneous tissues were infiltrated with 1% lidocaine without epinephrine. A small stab incision was made with an 11 blade scalpel, and an 11 gauge Murphy needle was advanced with CT guidance to the posterior cortex. Manual forced was used to advance the needle through the posterior cortex and the stylet was removed. A bone marrow aspirate was retrieved and passed to a cytotechnologist in the room. The Murphy needle was then advanced without the stylet for a core biopsy. The core biopsy was retrieved and also passed to a cytotechnologist. We then proceeded with L4 targeted biopsy. Using CT guidance, 11 gauge  Murphy needle was advanced through a trans pedicular lateral approach to the lytic lesion of L4. Once we confirmed needle tip position, coaxial core was acquired with 13 gauge device and then 11 gauge core was acquired. Samples placed into formalin. All needles were removed. Manual pressure was used for hemostasis and sterile dressings were placed. No complications were encountered no significant blood loss was encountered. Patient tolerated the procedure well and remained hemodynamically stable throughout. IMPRESSION: Status post CT-guided bone marrow biopsy, as well as CT-guided biopsy of L4 lytic lesion. Signed, Dulcy Fanny. Nadene Rubins, RPVI [facility] Radiology Electronically Signed   By: Corrie Mckusick D.O.   On: 03/04/2022 10:42   NM PET Image Initial (PI) Skull Base To Thigh  Result Date: 02/17/2022 CLINICAL DATA:  Initial treatment strategy for lytic destructive bone lesions. EXAM: NUCLEAR MEDICINE PET SKULL BASE TO THIGH TECHNIQUE: 9.2 mCi F-18 FDG was injected intravenously. Full-ring PET imaging was performed from the skull base to thigh after the radiotracer. CT data was obtained and used for attenuation correction and anatomic localization. Fasting blood glucose: 93 mg/dl COMPARISON:  MRI abdomen, same day and chest CT 01/10/2022 FINDINGS: Mediastinal blood pool activity: SUV max 2.80 Liver activity: SUV max NA NECK: No hypermetabolic lymph nodes in the neck. Incidental CT findings: Right maxillary sinus disease. CHEST: No hypermetabolic mediastinal or hilar nodes. No suspicious pulmonary nodules on the CT scan. Incidental CT findings: none ABDOMEN/PELVIS: No abnormal hypermetabolic activity within the liver, pancreas, adrenal glands, or spleen. No hypermetabolic lymph nodes in the abdomen or pelvis. There is mild asymmetric hypermetabolism in the peripheral zone of the prostate gland on the right side. SUV max is 4.99. Could not exclude the  possibility of prostate cancer. Recommend correlation  with PSA level. Incidental CT findings: Advanced cirrhotic changes involving the liver. The MRI shows small enhancing liver lesions indeterminate for HCC. Numerous small layering gallstones are noted the gallbladder. Renal calculi are noted bilaterally. Scattered aortic and iliac artery calcifications but no aneurysm. SKELETON: The left fourth rib lesion is hypermetabolic with SUV max of 5.88. The mixed lytic and sclerotic bone lesion causing spinal canal compromise at L4 demonstrates mild hypermetabolism with SUV max of 3.31. I do not see any other definite lesions. Biopsy of the rib lesion may be indicated. Incidental CT findings: none IMPRESSION: 1. Hypermetabolic left fourth rib lesion and L4 vertebral body lesion consistent with metastatic disease. 2. Mild hypermetabolism in the peripheral zone of the prostate gland on the right side. Could not exclude prostate cancer. Recommend correlation with PSA level. 3. No other primary neoplastic site is identified. The small liver lesions seen on the MRI are not hypermetabolic the PET scan. 4. Stable cirrhotic changes involving the liver. 5. Other incidental findings including gallstones and renal calculi. Electronically Signed   By: Marijo Sanes M.D.   On: 02/17/2022 16:08   MR ABDOMEN WWO CONTRAST  Result Date: 02/16/2022 CLINICAL DATA:  Hepatic cirrhosis, suspected malignancy EXAM: MRI ABDOMEN WITHOUT AND WITH CONTRAST TECHNIQUE: Multiplanar multisequence MR imaging of the abdomen was performed both before and after the administration of intravenous contrast. CONTRAST:  10 mL Eovist COMPARISON:  CT abdomen and pelvis 12/16/2018 FINDINGS: Lower chest: No acute findings. Hepatobiliary: Nodular contour of the liver consistent with cirrhosis. Also evidence of hepatic steatosis. There are multifocal amorphous areas of early arterial phase hyperenhancement most prominent in the right hepatic lobe, largest areas  include approximately 2.4 cm area peripherally in segment 5, 1.8 cm posteriorly in segment 7, and 1.7 cm focus in the central right hepatic lobe just to the right of the IVC. All of these areas are essentially isointense on the more delayed postcontrast sequences, with no washout lesion identified and no enhancing capsule appreciated. No significant corresponding hyperintense T2 or DWI signal. Multiple small gallstones. No gallbladder wall thickening or surrounding edema. No biliary ductal dilatation. Pancreas: No mass, inflammatory changes, or other parenchymal abnormality identified. Spleen:  Within normal limits in size and appearance. Adrenals/Urinary Tract: No masses identified. No evidence of hydronephrosis. Stomach/Bowel: No evidence of bowel obstruction. Vascular/Lymphatic: No pathologically enlarged lymph nodes identified. No abdominal aortic aneurysm demonstrated. Other:  No ascites. Musculoskeletal: There is an approximately 3.2 x 2.7 cm suspicious enhancing lesion in the L4 vertebral body which appears expansile dorsally causing spinal canal stenosis. IMPRESSION: 1. Enhancing lesion in the L4 vertebral body suspicious for metastasis, which appears expansile dorsally causing spinal canal stenosis. Consider whole-body bone scan and/or MRI lumbar spine with contrast as indicated. 2. Hepatic cirrhosis with multifocal hepatic arterial phase enhancing areas as described, LI-RADS 3. Consider repeat imaging in 3-6 months. 3. Hepatic steatosis. 4. Cholelithiasis. Electronically Signed   By: Ofilia Neas M.D.   On: 02/16/2022 14:52      IMPRESSION/PLAN: This visit was conducted via telephone to spare the patient unnecessary potential exposure in the healthcare setting during the current COVID-19 pandemic. 1. 61 y.o. 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 Today, we talked to the patient about the findings and workup thus far. We discussed the  natural history of metastatic prostate adenocarcinoma and general treatment, highlighting the role of radiotherapy in the management. We discussed the available radiation techniques, and focused on the details  and logistics of delivery.  A PSMA PET scan has been ordered but has not yet been scheduled.  We discussed that pending there are no findings suggestive of widespread metastatic disease on the PSMA PET scan, our recommendation would be to proceed with an 8-week course of daily radiotherapy to the prostate and pelvic lymph nodes as well as the oligometastatic sites, concurrent with LT-ADT.  We also reviewed the role of ADT in the treatment of advanced prostate cancer and outlined the associated side effects that could be expected with this therapy. He has already started [place] ADT on 03/09/2022 and understands the rationale behind the intentional delay in starting radiotherapy approximately 2 months after the start of ADT, to allow for the radiosensitizing effects of this therapy.  We reviewed the anticipated acute and late sequelae associated with radiation in this setting. The patient was encouraged to ask questions that were answered to his stated satisfaction.  At the conclusion of our conversation, the patient is in agreement to proceed with the PSMA PET scan and pending there are no findings suggestive of widespread metastatic disease on this study, he would like to proceed with the recommended 8-week course of daily radiotherapy to the prostate and pelvic lymph nodes as well as the oligometastatic sites, concurrent with LT-ADT.  We will plan for a 5 fraction course of SBRT to the oligo metastatic sites, simultaneous with his local pelvic radiation which we would anticipate starting in mid September 2023, approximately 2 months from the start of ADT.  He appears to have a good understanding of his disease and our treatment recommendations which are of curative intent.  He understands that while the  likelihood for cure is relatively low in this setting, there is very high probability that we would achieve very good, long-term, durable control of his disease with the recommended treatment.  He is comfortable and in agreement with the stated plan.  We will share our discussion with Dr. Alen Blew and once we have the results of the upcoming PSMA PET scan, we will proceed with treatment planning accordingly.  He would like to establish care with a local urologist for management of his LUTS and also for the benefit of fiducial markers and SpaceOAR gel placement prior to beginning his radiation treatments.  We will make a referral to [facility].  Given current concerns for patient exposure during the COVID-19 pandemic, this encounter was conducted via telephone. The patient was notified in advance and was offered a [place] meeting to allow for face to face communication but unfortunately reported that he/she did not have the appropriate resources/technology to support such a visit and instead preferred to proceed with telephone consult. The patient has given verbal consent for this type of encounter. The attendants for this meeting include Tyler Pita MD, Ashlyn Bruning PA-C, and patient, Aidenjames Heckmann. Owens Shark. During the encounter, Tyler Pita MD, and Andi Devon, were located at [facility] Radiation Oncology Department.  Patient, Jerl Munyan. Licciardi was located at home.   We personally spent 70 minutes in this encounter including chart review, reviewing radiological studies, meeting face-to-face with the patient, entering orders and completing documentation.    Nicholos Johns, PA-C    Tyler Pita, MD  [facility] Direct Dial: [phone]  Fax: [phone] [email or web address]  Skype  LinkedIn

## 2022-03-17 NOTE — Telephone Encounter (Signed)
Oral Oncology Pharmacist Encounter  Due to cost of medication, patient switched to enzalutamide (xtandi). MD sent new prescription to [facility] long outpatient pharmacy.   Received new prescription for enzalutamide Gillermina Phy) for the treatment of castration sensitive prostate cancer, planned duration until disease progression or unacceptable toxicity.  Labs from 02/11/22 assessed, no interventions needed. Prescription dose and frequency assessed. Per MD, okay to switch xtandi capsules to tablets.  Current medication list in Epic reviewed, DDIs with Xtandi identified: oxycodone pain control may be slightly decreased from xtandi.  Evaluated chart and no patient barriers to medication adherence noted.   Patient agreement for treatment documented in MD note on 03/09/2022.  Prescription has been e-scribed to the [facility] for benefits analysis and approval.  Oral Oncology Clinic will continue to follow for insurance authorization, copayment issues, initial counseling and start date.  Drema Halon, PharmD Hematology/Oncology Clinical Pharmacist [facility] [phone]

## 2022-03-18 ENCOUNTER — Other Ambulatory Visit (HOSPITAL_COMMUNITY): Payer: Self-pay

## 2022-03-18 ENCOUNTER — Other Ambulatory Visit: Payer: Self-pay

## 2022-03-18 ENCOUNTER — Telehealth: Payer: Self-pay | Admitting: *Deleted

## 2022-03-18 DIAGNOSIS — C7951 Secondary malignant neoplasm of bone: Secondary | ICD-10-CM

## 2022-03-18 MED ORDER — ENZALUTAMIDE 40 MG PO CAPS
160.0000 mg | ORAL_CAPSULE | Freq: Every day | ORAL | 0 refills | Status: DC
  Filled 2022-03-18: qty 120, 30d supply, fill #0

## 2022-03-18 NOTE — Progress Notes (Signed)
Called and introduced myself to the patient as the prostate nurse navigator. Patient has seen both Dr. Alen Blew and Dr. Tammi Klippel and is currently waiting PET PSMA authorization, and has started [facility] for treatment.  I provided education on next steps for getting scan, and moving forward with radiation treatment.  Patient has my direct contact information, and agreeable to call with any questions/barriers.  Will continue to follow to ensure navigation needs are met.

## 2022-03-18 NOTE — Telephone Encounter (Signed)
Called patient to inform of [place] appt. with Dr. Abner Greenspan on 04-06-22- arrival time- 8 am, spoke with patient and he is aware of this appt.

## 2022-03-21 ENCOUNTER — Other Ambulatory Visit (HOSPITAL_COMMUNITY): Payer: Self-pay

## 2022-03-22 ENCOUNTER — Ambulatory Visit (INDEPENDENT_AMBULATORY_CARE_PROVIDER_SITE_OTHER): Payer: Commercial Managed Care - HMO | Admitting: Pulmonary Disease

## 2022-03-22 ENCOUNTER — Telehealth: Payer: Self-pay | Admitting: *Deleted

## 2022-03-22 ENCOUNTER — Encounter: Payer: Self-pay | Admitting: Pulmonary Disease

## 2022-03-22 ENCOUNTER — Other Ambulatory Visit: Payer: Self-pay | Admitting: *Deleted

## 2022-03-22 VITALS — BP 120/66 | HR 84 | Temp 98.6°F | Ht 67.0 in | Wt 186.4 lb

## 2022-03-22 DIAGNOSIS — J4540 Moderate persistent asthma, uncomplicated: Secondary | ICD-10-CM

## 2022-03-22 DIAGNOSIS — Z8616 Personal history of COVID-19: Secondary | ICD-10-CM

## 2022-03-22 DIAGNOSIS — J984 Other disorders of lung: Secondary | ICD-10-CM

## 2022-03-22 DIAGNOSIS — J432 Centrilobular emphysema: Secondary | ICD-10-CM | POA: Diagnosis not present

## 2022-03-22 DIAGNOSIS — J439 Emphysema, unspecified: Secondary | ICD-10-CM | POA: Diagnosis not present

## 2022-03-22 DIAGNOSIS — C61 Malignant neoplasm of prostate: Secondary | ICD-10-CM

## 2022-03-22 DIAGNOSIS — C7951 Secondary malignant neoplasm of bone: Secondary | ICD-10-CM

## 2022-03-22 DIAGNOSIS — M899 Disorder of bone, unspecified: Secondary | ICD-10-CM | POA: Diagnosis not present

## 2022-03-22 DIAGNOSIS — Z87891 Personal history of nicotine dependence: Secondary | ICD-10-CM

## 2022-03-22 NOTE — Progress Notes (Signed)
Synopsis: Referred in November 2022 for lung nodule by No ref. provider found  Subjective:   PATIENT ID: Dylan [name] GENDER: male DOB: 1961-08-26, MRN: [id number]  Chief Complaint  Patient presents with   Follow-up    Follow up for wheezing. Pt states that he is doing ok. Pt states that he is taking breztri daily and is needing refills and albuterol as needed.     This is a 61 year old gentleman, history of COPD, hepatitis C, smoker, cirrhosis.  Presents to the office today for evaluation of abnormal CT imaging.  He is a former smoker quit couple years ago.Patient recently was admitted to the emergency department for a COPD exacerbation.  He was started on steroids.  He recently was given a prescription for doxycycline as well.  He is almost through with this.  Patient had a CT scan of the chest in 2020 that did not have any significant nodular findings however at the time he had COVID-19 and had significant multifocal inflammatory areas within the chest.  His chest x-ray completed on 07/15/2021 revealed a nodular opacity within the left upper lobe that was also present on a chest x-ray, 07/04/2021.  I reviewed the images today with the patient in the office.  OV 10/15/2021: Here today for follow-up regarding COPD.  Patient was seen initially after an abnormal chest x-ray that revealed a upper lobe opacity.Patient had a subsequent CT image of the chest in December 2022 she had a lucency within the left rib at that time with an associated healed fracture.  Also found to have subtle subpleural groundglass opacities within the periphery of both lungs suggestive of his prior COVID-19 diagnosis.  Patient responded well to the [facility] samples that he was given last time however was unable to afford them to the pharmacy.  So he started using his ex-wife's nebulizer.  He has PFTs completed prior to today's office with normal ratio yet has reduced FEV1 and FVC also has a mildly reduced TLC and a mildly  reduced DLCO.  Consistent with a mixed obstructive and restrictive pattern with his history of smoking plus COVID-19 pneumonia.Marland Kitchen  His biggest complaint is nocturnal wheezing.  Has shortness of breath with exertion.  OV 03/22/2022: Here today for follow-up regarding COPD.  Also had some recent follow-up imaging that showed enlarging rib lesion.  We have been ordering images as well as setting up referrals for medical oncology which she is already established care with.  He was sent for CT-guided biopsy which confirmed metastatic prostate cancer.  He has followed up and already established care with Dr. Alen Blew.  From respiratory standpoint he is doing okay.  Currently managed with Breztri.  He feels a little bit breathless at times.  Rarely uses albuterol.  He is worried about the cost related to [facility] inhaler.    Past Medical History:  Diagnosis Date   Cholelithiasis    Cirrhosis ([place])    COPD (chronic obstructive pulmonary disease) (HCC)    1 year   Gallstones    Hepatitis C    3 years   PONV (postoperative nausea and vomiting)    Prostate cancer metastatic to bone ([place]) 03/08/2022   Smoker    1/2 ppd for 49 years     Family History  Problem Relation Age of Onset   Diabetes Mother    Alzheimer's disease Mother    Diabetes Maternal Grandmother    Mesothelioma Father    Colon cancer Neg Hx    Stomach cancer  Neg Hx      Past Surgical History:  Procedure Laterality Date   COLONOSCOPY WITH PROPOFOL N/A 12/11/2014   Procedure: COLONOSCOPY WITH PROPOFOL;  Surgeon: Inda Castle, MD;  Location: [facility];  Service: Endoscopy;  Laterality: N/A;   ESOPHAGEAL BANDING N/A 12/11/2014   Procedure: ESOPHAGEAL BANDING;  Surgeon: Inda Castle, MD;  Location: [facility];  Service: Endoscopy;  Laterality: N/A;   ESOPHAGOGASTRODUODENOSCOPY N/A 06/06/2017   Procedure: ESOPHAGOGASTRODUODENOSCOPY (EGD);  Surgeon: Yetta Flock, MD;  Location: [facility];  Service: Gastroenterology;   Laterality: N/A;   ESOPHAGOGASTRODUODENOSCOPY (EGD) WITH PROPOFOL N/A 12/11/2014   Procedure: ESOPHAGOGASTRODUODENOSCOPY (EGD) WITH PROPOFOL;  Surgeon: Inda Castle, MD;  Location: [facility];  Service: Endoscopy;  Laterality: N/A;   KNEE ARTHROSCOPY Right     Social History   Socioeconomic History   Marital status: Divorced    Spouse name: Not on file   Number of children: 0   Years of education: Not on file   Highest education level: Not on file  Occupational History   Occupation: Secondary school teacher  Tobacco Use   Smoking status: Former    Packs/day: 0.50    Years: 40.00    Total pack years: 20.00    Types: Cigarettes    Quit date: 04/11/2014    Years since quitting: 7.9   Smokeless tobacco: Never  Vaping Use   Vaping Use: Never used  Substance and Sexual Activity   Alcohol use: Yes    Alcohol/week: 2.0 standard drinks of alcohol    Types: 2 Standard drinks or equivalent per week   Drug use: Not Currently    Comment: hx of 28 years ago - marijuana, acid, cocaine    Sexual activity: Yes  Other Topics Concern   Not on file  Social History Narrative   Not on file   Social Determinants of Health   Financial Resource Strain: Medium Risk (03/14/2022)   Overall Financial Resource Strain (CARDIA)    Difficulty of Paying Living Expenses: Somewhat hard  Food Insecurity: Food Insecurity Present (03/14/2022)   Hunger Vital Sign    Worried About Running Out of Food in the Last Year: Sometimes true    Ran Out of Food in the Last Year: Sometimes true  Transportation Needs: No Transportation Needs (03/14/2022)   PRAPARE - Hydrologist (Medical): No    Lack of Transportation (Non-Medical): No  Physical Activity: Not on file  Stress: Not on file  Social Connections: Not on file  Intimate Partner Violence: Not on file     Allergies  Allergen Reactions   Other Nausea And Vomiting    Narcotics- pt states he does not like to take narcotics d/t makes  him sick     Outpatient Medications Prior to Visit  Medication Sig Dispense Refill   albuterol (PROVENTIL) (2.5 MG/3ML) 0.083% nebulizer solution Take 3 mLs (2.5 mg total) by nebulization every 6 (six) hours as needed for wheezing or shortness of breath. 75 mL 12   benzonatate (TESSALON) 100 MG capsule Take 1 capsule (100 mg total) by mouth 3 (three) times daily as needed for cough. 30 capsule 0   Budeson-Glycopyrrol-Formoterol (BREZTRI AEROSPHERE) 160-9-4.8 MCG/ACT AERO Inhale 2 puffs into the lungs in the morning and at bedtime. 10.7 g 6   Chlorphen-Phenyleph-ASA (ALKA-SELTZER PLUS COLD) 2-7.8-325 MG TBEF Take 2 tablets by mouth 2 (two) times daily as needed (cold symptoms).     cyclobenzaprine (FLEXERIL) 10 MG tablet Take 1 tablet (  10 mg total) by mouth 2 (two) times daily between meals as needed for muscle spasms. 30 tablet 0   enzalutamide (XTANDI) 40 MG capsule Take 4 capsules (160 mg total) by mouth daily. 120 capsule 0   guaiFENesin (MUCINEX) 600 MG 12 hr tablet Take 1 tablet (600 mg total) by mouth 2 (two) times daily. (Patient taking differently: Take 600 mg by mouth 2 (two) times daily as needed.) 20 tablet 0   ibuprofen (ADVIL) 800 MG tablet Take 1 tablet (800 mg total) by mouth 3 (three) times daily. 21 tablet 0   Oxycodone HCl 10 MG TABS Take 1 tablet by mouth every 6 hours as needed. 90 tablet 0   No facility-administered medications prior to visit.    Review of Systems  Constitutional:  Negative for chills, fever, malaise/fatigue and weight loss.  HENT:  Negative for hearing loss, sore throat and tinnitus.   Eyes:  Negative for blurred vision and double vision.  Respiratory:  Positive for cough and shortness of breath. Negative for hemoptysis, sputum production, wheezing and stridor.   Cardiovascular:  Negative for chest pain, palpitations, orthopnea, leg swelling and PND.  Gastrointestinal:  Negative for abdominal pain, constipation, diarrhea, heartburn, nausea and vomiting.   Genitourinary:  Negative for dysuria, hematuria and urgency.  Musculoskeletal:  Negative for joint pain and myalgias.  Skin:  Negative for itching and rash.  Neurological:  Negative for dizziness, tingling, weakness and headaches.  Endo/Heme/Allergies:  Negative for environmental allergies. Does not bruise/bleed easily.  Psychiatric/Behavioral:  Negative for depression. The patient is not nervous/anxious and does not have insomnia.   All other systems reviewed and are negative.    Objective:  Physical Exam Vitals reviewed.  Constitutional:      General: He is not in acute distress.    Appearance: He is well-developed.  HENT:     Head: Normocephalic and atraumatic.  Eyes:     General: No scleral icterus.    Conjunctiva/sclera: Conjunctivae normal.     Pupils: Pupils are equal, round, and reactive to light.  Neck:     Vascular: No JVD.     Trachea: No tracheal deviation.  Cardiovascular:     Rate and Rhythm: Normal rate and regular rhythm.     Heart sounds: Normal heart sounds. No murmur heard. Pulmonary:     Effort: Pulmonary effort is normal. No tachypnea, accessory muscle usage or respiratory distress.     Breath sounds: No stridor. No wheezing, rhonchi or rales.  Abdominal:     General: There is no distension.     Palpations: Abdomen is soft.     Tenderness: There is no abdominal tenderness.  Musculoskeletal:        General: No tenderness.     Cervical back: Neck supple.  Lymphadenopathy:     Cervical: No cervical adenopathy.  Skin:    General: Skin is warm and dry.     Capillary Refill: Capillary refill takes less than 2 seconds.     Findings: No rash.  Neurological:     Mental Status: He is alert and oriented to person, place, and time.  Psychiatric:        Behavior: Behavior normal.      Vitals:   03/22/22 1607  BP: 120/66  Pulse: 84  Temp: 98.6 F (37 C)  TempSrc: Oral  SpO2: 95%  Weight: 186 lb 6.4 oz (84.6 kg)  Height: '5\' 7"'$  (1.702 m)   95%  RA BMI Readings from Last 3 Encounters:  03/22/22 29.19 kg/m  03/17/22 28.98 kg/m  03/09/22 28.99 kg/m   Wt Readings from Last 3 Encounters:  03/22/22 186 lb 6.4 oz (84.6 kg)  03/17/22 185 lb (83.9 kg)  03/09/22 185 lb 1.6 oz (84 kg)     CBC    Component Value Date/Time   WBC 4.1 03/04/2022 0801   RBC 5.15 03/04/2022 0801   HGB 15.4 03/04/2022 0801   HGB 15.7 02/11/2022 1625   HCT 46.7 03/04/2022 0801   PLT 178 03/04/2022 0801   PLT 186 02/11/2022 1625   MCV 90.7 03/04/2022 0801   MCH 29.9 03/04/2022 0801   MCHC 33.0 03/04/2022 0801   RDW 13.2 03/04/2022 0801   LYMPHSABS 1.2 03/04/2022 0801   MONOABS 0.7 03/04/2022 0801   EOSABS 0.4 03/04/2022 0801   BASOSABS 0.1 03/04/2022 0801    Chest Imaging: 11/17 and 07/04/2021 chest x-ray Left upper lobe nodular opacity  November 2020: CT chest: Multiple patchy peripheral infiltrates consistent with COVID-19. The patient's images have been independently reviewed by me.    08/19/2021 high-resolution CT scan of the chest: Subtle findings within the lung parenchyma suggestive of COVID-19 pneumonia. Additionally he has a rib lesion that was seen on the CT scan that I think we need to follow closely. The patient's images have been independently reviewed by me.     Pulmonary Functions Testing Results:    Latest Ref Rng & Units 09/10/2021    2:44 PM  PFT Results  FVC-Pre L 2.46   FVC-Predicted Pre % 69   FVC-Post L 2.44   FVC-Predicted Post % 68   Pre FEV1/FVC % % 75   Post FEV1/FCV % % 83   FEV1-Pre L 1.84   FEV1-Predicted Pre % 67   FEV1-Post L 2.01   DLCO uncorrected ml/min/mmHg 18.69   DLCO UNC% % 75   DLCO corrected ml/min/mmHg 18.69   DLCO COR %Predicted % 75   DLVA Predicted % 95   TLC L 4.93   TLC % Predicted % 77   RV % Predicted % 104     FeNO:   Pathology:   Echocardiogram:   Heart Catheterization:     Assessment & Plan:     ICD-10-CM   1. Moderate persistent asthma, unspecified whether  complicated  V56.43     2. Mixed restrictive and obstructive lung disease ()  J43.9    J98.4     3. Rib lesion  M89.9     4. Centrilobular emphysema ([place])  J43.2     5. History of COVID-19  Z86.16     6. Former smoker  Z87.891     14. Prostate cancer metastatic to bone [facility])  C61    C79.51        Discussion:  This is a 61 year old gentleman initially seen with a history of COVID-19, former smoker history of asthma, PFTs with mixed restrictive obstructive defect likely related to BMI, reduced TLC and mildly reduced DLCO on previous PFTs.  He has quit smoking for several years.  Imaging had a rib lesion and follow-up imaging was later concern for prostate cancer.  Patient has underwent biopsy and establish care with Dr. Alen Blew.  Plan: As for his respiratory symptoms we will continue Breztri. We will give him financial aid application to help with cost. Samples of Breztri today if possible. Continue albuterol as needed. For his respiratory symptoms he can follow-up with [place] in 1 year or as needed. He is on call us and let us  know if anything changes throughout the year.    Current Outpatient Medications:    albuterol (PROVENTIL) (2.5 MG/3ML) 0.083% nebulizer solution, Take 3 mLs (2.5 mg total) by nebulization every 6 (six) hours as needed for wheezing or shortness of breath., Disp: 75 mL, Rfl: 12   benzonatate (TESSALON) 100 MG capsule, Take 1 capsule (100 mg total) by mouth 3 (three) times daily as needed for cough., Disp: 30 capsule, Rfl: 0   Budeson-Glycopyrrol-Formoterol (BREZTRI AEROSPHERE) 160-9-4.8 MCG/ACT AERO, Inhale 2 puffs into the lungs in the morning and at bedtime., Disp: 10.7 g, Rfl: 6   Chlorphen-Phenyleph-ASA (ALKA-SELTZER PLUS COLD) 2-7.8-325 MG TBEF, Take 2 tablets by mouth 2 (two) times daily as needed (cold symptoms)., Disp: , Rfl:    cyclobenzaprine (FLEXERIL) 10 MG tablet, Take 1 tablet (10 mg total) by mouth 2 (two) times daily between meals as needed for  muscle spasms., Disp: 30 tablet, Rfl: 0   enzalutamide (XTANDI) 40 MG capsule, Take 4 capsules (160 mg total) by mouth daily., Disp: 120 capsule, Rfl: 0   guaiFENesin (MUCINEX) 600 MG 12 hr tablet, Take 1 tablet (600 mg total) by mouth 2 (two) times daily. (Patient taking differently: Take 600 mg by mouth 2 (two) times daily as needed.), Disp: 20 tablet, Rfl: 0   ibuprofen (ADVIL) 800 MG tablet, Take 1 tablet (800 mg total) by mouth 3 (three) times daily., Disp: 21 tablet, Rfl: 0   Oxycodone HCl 10 MG TABS, Take 1 tablet by mouth every 6 hours as needed., Disp: 90 tablet, Rfl: 0   Garner Nash, DO [facility] 03/22/2022 4:16 PM

## 2022-03-22 NOTE — Telephone Encounter (Signed)
Received VM from Cammie, nurse case manager with [facility].  She states firmagon has been ordered for this patient by Dr. Alen Blew & [facility] is out of network for this injection.  Insurance does cover the patient receiving this injection at home, administered by a nurse - this has been authorized.  Case manager is requesting that a nursing order & demographic information be faxed to [facility] to set this up, phone number [phone], fax number [phone].  Attempted to contact Cammie to inform her Dr. Alen Blew is out of the office this week & to determine if the order may be sent next week upon his return, message left for Cammie to return call to this office, [phone].

## 2022-03-22 NOTE — Patient Instructions (Signed)
Thank you for visiting Dr. Valeta Harms at [facility]. Today we recommend the following:  Breztri samples today, New prescription and 1 year refills  Continue albuterol inhaler as needed  [place] application for Breztri   Return in about 1 year (around 03/23/2023), or if symptoms worsen or fail to improve, for with APP or Dr. Valeta Harms.    Please do your part to reduce the spread of COVID-19.

## 2022-03-23 ENCOUNTER — Encounter: Payer: Self-pay | Admitting: Physician Assistant

## 2022-03-28 NOTE — Progress Notes (Signed)
PSMA Pet now has approval status under [employer].   Approval number [id number]  Approval dates 03/10/2022-09/06/2022.   PSMA PET scheduled for 04/05/2022 @ 1:30 pm with arrival time of 1:00pm.   Voicemail left for call back from patient to review appt.

## 2022-03-29 ENCOUNTER — Telehealth: Payer: Self-pay | Admitting: *Deleted

## 2022-03-29 NOTE — Telephone Encounter (Signed)
Firmagon orders signed by Dr. Alen Blew, faxed to [facility], (two different numbers), [phone] & [phone].  Fax confirmation received from both numbers.

## 2022-03-30 ENCOUNTER — Other Ambulatory Visit (HOSPITAL_COMMUNITY): Payer: Self-pay

## 2022-03-31 ENCOUNTER — Telehealth: Payer: Self-pay | Admitting: *Deleted

## 2022-03-31 ENCOUNTER — Other Ambulatory Visit (HOSPITAL_COMMUNITY): Payer: Self-pay

## 2022-03-31 NOTE — Telephone Encounter (Signed)
Dylan [name] states Dr Alen Blew told him to call if the medication makes him have any burning with urination. He is now having burning with urination.

## 2022-03-31 NOTE — Telephone Encounter (Signed)
Notified that this is a normal side effect and to increase hydration.  Will call us if it does not improve

## 2022-04-04 NOTE — Progress Notes (Signed)
RN spoke with patient to confirm he is aware of upcoming PSMA PET scan. Verbalized confirmation, RN offered to have imaging earlier than 9/5, however, this date is what patient is what works best for patient.    Pt will have a nurse to administer Firmagon injection on 8/8, and will not need upcoming injection appointment.   RN encouraged patient to call with any concerns/questions. No further needs at this time.

## 2022-04-05 ENCOUNTER — Telehealth: Payer: Self-pay

## 2022-04-05 ENCOUNTER — Other Ambulatory Visit (HOSPITAL_COMMUNITY): Payer: Self-pay

## 2022-04-05 ENCOUNTER — Other Ambulatory Visit: Payer: Self-pay | Admitting: Oncology

## 2022-04-05 ENCOUNTER — Inpatient Hospital Stay (HOSPITAL_COMMUNITY): Admission: RE | Admit: 2022-04-05 | Payer: Commercial Managed Care - HMO | Source: Ambulatory Visit

## 2022-04-05 DIAGNOSIS — C61 Malignant neoplasm of prostate: Secondary | ICD-10-CM

## 2022-04-05 MED ORDER — XTANDI 40 MG PO CAPS
160.0000 mg | ORAL_CAPSULE | Freq: Every day | ORAL | 0 refills | Status: DC
  Filled 2022-04-05: qty 120, 30d supply, fill #0

## 2022-04-05 NOTE — Progress Notes (Signed)
RN received phone call from patient with concerns regarding [name] not being delivered for upcoming injection appointment on 8/9.   RN collaborated with Dr. Hazeline Junker nurse, and she was able to confirm through patient's pharmacy that medication with be shipped out tonight and be delivered by noon tomorrow.   RN notified patient, verbalized understanding and thankfulness.  No further needs at this time.

## 2022-04-05 NOTE — Telephone Encounter (Signed)
Spoke with Gar Ponto with physicians support unit at [phone] to request a reconsideration for denial of PET scan. Received approval auth # [id number] good from 03/10/22-09/06/22. Sent information to [employer].

## 2022-04-06 ENCOUNTER — Telehealth: Payer: Self-pay

## 2022-04-06 ENCOUNTER — Inpatient Hospital Stay: Payer: Commercial Managed Care - HMO

## 2022-04-06 ENCOUNTER — Other Ambulatory Visit (HOSPITAL_COMMUNITY): Payer: Self-pay

## 2022-04-06 MED ORDER — TAMSULOSIN HCL 0.4 MG PO CAPS
ORAL_CAPSULE | ORAL | 3 refills | Status: DC
Start: 2022-04-06 — End: 2023-10-18
  Filled 2022-04-06: qty 90, 90d supply, fill #0
  Filled 2022-06-30: qty 90, 90d supply, fill #1
  Filled 2022-09-28: qty 90, 90d supply, fill #2

## 2022-04-06 NOTE — Telephone Encounter (Signed)
[employer] representative called styating that Xtandi to be refilled at [facility] , [place] moving forward.

## 2022-04-07 ENCOUNTER — Other Ambulatory Visit (HOSPITAL_COMMUNITY): Payer: Self-pay

## 2022-04-12 ENCOUNTER — Telehealth: Payer: Self-pay | Admitting: *Deleted

## 2022-04-12 ENCOUNTER — Other Ambulatory Visit: Payer: Self-pay | Admitting: Oncology

## 2022-04-12 ENCOUNTER — Other Ambulatory Visit (HOSPITAL_COMMUNITY): Payer: Self-pay

## 2022-04-12 MED ORDER — OXYCODONE HCL 10 MG PO TABS
10.0000 mg | ORAL_TABLET | Freq: Four times a day (QID) | ORAL | 0 refills | Status: DC | PRN
Start: 2022-04-12 — End: 2022-05-17
  Filled 2022-04-12: qty 90, 23d supply, fill #0

## 2022-04-12 NOTE — Telephone Encounter (Signed)
-----   Message from [name], MD sent at 04/12/2022  2:55 PM EDT ----- Prescription for oxycodone was refilled.  I prefer not to prescribe oxycodone with Tylenol.  He can use Tylenol only as needed in between the oxycodone dosing. ----- Message ----- From: Rolene Course, RN Sent: 04/12/2022   2:38 PM EDT To: [name], MD  This patient called & is asking if he can get a rx for oxycodone with tylenol.  He has been taking these meds separately.  Bethena Roys

## 2022-04-12 NOTE — Telephone Encounter (Signed)
Returned PC to patient, informed him of Dr. Hazeline Junker message as below.  Patient states he takes 2 tylenol every time he takes oxycodone.  Advised patient not to take more than 2000 mg of tylenol in 24 hours & to try staggering his oxycodone & tylenol instead of taking it together.  Patient verbalizes understanding.

## 2022-04-13 ENCOUNTER — Other Ambulatory Visit (HOSPITAL_COMMUNITY): Payer: Self-pay

## 2022-04-26 ENCOUNTER — Other Ambulatory Visit (HOSPITAL_COMMUNITY): Payer: Commercial Managed Care - HMO

## 2022-04-28 ENCOUNTER — Other Ambulatory Visit (HOSPITAL_COMMUNITY): Payer: Self-pay

## 2022-04-28 ENCOUNTER — Other Ambulatory Visit: Payer: Self-pay | Admitting: Urology

## 2022-05-03 ENCOUNTER — Encounter: Payer: Self-pay | Admitting: *Deleted

## 2022-05-03 ENCOUNTER — Encounter (HOSPITAL_COMMUNITY)
Admission: RE | Admit: 2022-05-03 | Discharge: 2022-05-03 | Disposition: A | Discharge status: Home / Self Care | Payer: Commercial Managed Care - HMO | Source: Ambulatory Visit | Attending: Oncology | Admitting: Oncology

## 2022-05-03 DIAGNOSIS — C61 Malignant neoplasm of prostate: Secondary | ICD-10-CM | POA: Insufficient documentation

## 2022-05-03 DIAGNOSIS — C7951 Secondary malignant neoplasm of bone: Secondary | ICD-10-CM | POA: Diagnosis present

## 2022-05-03 MED ORDER — PIFLIFOLASTAT F 18 (PYLARIFY) INJECTION
9.0000 | Freq: Once | INTRAVENOUS | Status: AC
  Administered 2022-05-03: 7.99 via INTRAVENOUS

## 2022-05-04 ENCOUNTER — Encounter (HOSPITAL_BASED_OUTPATIENT_CLINIC_OR_DEPARTMENT_OTHER): Payer: Self-pay | Admitting: Urology

## 2022-05-04 ENCOUNTER — Other Ambulatory Visit (HOSPITAL_COMMUNITY): Payer: Self-pay

## 2022-05-05 ENCOUNTER — Other Ambulatory Visit: Payer: Self-pay

## 2022-05-05 ENCOUNTER — Inpatient Hospital Stay: Payer: Commercial Managed Care - HMO | Attending: Physician Assistant

## 2022-05-05 ENCOUNTER — Inpatient Hospital Stay (HOSPITAL_BASED_OUTPATIENT_CLINIC_OR_DEPARTMENT_OTHER): Payer: Commercial Managed Care - HMO | Admitting: Oncology

## 2022-05-05 ENCOUNTER — Inpatient Hospital Stay: Payer: Commercial Managed Care - HMO

## 2022-05-05 ENCOUNTER — Encounter (HOSPITAL_BASED_OUTPATIENT_CLINIC_OR_DEPARTMENT_OTHER): Payer: Self-pay | Admitting: Urology

## 2022-05-05 VITALS — BP 121/80 | HR 82 | Temp 98.0°F | Resp 18 | Ht 67.0 in | Wt 183.9 lb

## 2022-05-05 DIAGNOSIS — C61 Malignant neoplasm of prostate: Secondary | ICD-10-CM | POA: Diagnosis present

## 2022-05-05 DIAGNOSIS — I10 Essential (primary) hypertension: Secondary | ICD-10-CM | POA: Diagnosis not present

## 2022-05-05 DIAGNOSIS — C7951 Secondary malignant neoplasm of bone: Secondary | ICD-10-CM

## 2022-05-05 DIAGNOSIS — J432 Centrilobular emphysema: Secondary | ICD-10-CM | POA: Diagnosis not present

## 2022-05-05 LAB — CMP (CANCER CENTER ONLY)
ALT: 26 U/L (ref 0–44)
AST: 26 U/L (ref 15–41)
Albumin: 4.2 g/dL (ref 3.5–5.0)
Alkaline Phosphatase: 81 U/L (ref 38–126)
Anion gap: 5 (ref 5–15)
BUN: 18 mg/dL (ref 8–23)
CO2: 30 mmol/L (ref 22–32)
Calcium: 10 mg/dL (ref 8.9–10.3)
Chloride: 107 mmol/L (ref 98–111)
Creatinine: 1.32 mg/dL — ABNORMAL HIGH (ref 0.61–1.24)
GFR, Estimated: >60 mL/min (ref >60)
Glucose, Bld: 106 mg/dL — ABNORMAL HIGH (ref 70–99)
Potassium: 4.3 mmol/L (ref 3.5–5.1)
Sodium: 142 mmol/L (ref 135–145)
Total Bilirubin: 0.9 mg/dL (ref 0.3–1.2)
Total Protein: 7.5 g/dL (ref 6.5–8.1)

## 2022-05-05 LAB — CBC WITH DIFFERENTIAL (CANCER CENTER ONLY)
Abs Immature Granulocytes: 0 10*3/uL (ref 0.00–0.07)
Basophils Absolute: 0 10*3/uL (ref 0.0–0.1)
Basophils Relative: 1 %
Eosinophils Absolute: 0.4 10*3/uL (ref 0.0–0.5)
Eosinophils Relative: 11 %
HCT: 41.7 % (ref 39.0–52.0)
Hemoglobin: 14.1 g/dL (ref 13.0–17.0)
Immature Granulocytes: 0 %
Lymphocytes Relative: 37 %
Lymphs Abs: 1.4 10*3/uL (ref 0.7–4.0)
MCH: 30.3 pg (ref 26.0–34.0)
MCHC: 33.8 g/dL (ref 30.0–36.0)
MCV: 89.7 fL (ref 80.0–100.0)
Monocytes Absolute: 0.6 10*3/uL (ref 0.1–1.0)
Monocytes Relative: 14 %
Neutro Abs: 1.5 10*3/uL — ABNORMAL LOW (ref 1.7–7.7)
Neutrophils Relative %: 37 %
Platelet Count: 160 10*3/uL (ref 150–400)
RBC: 4.65 MIL/uL (ref 4.22–5.81)
RDW: 13 % (ref 11.5–15.5)
WBC Count: 3.9 10*3/uL — ABNORMAL LOW (ref 4.0–10.5)
nRBC: 0 % (ref 0.0–0.2)

## 2022-05-05 NOTE — Progress Notes (Signed)
Spoke w/ via phone for pre-op interview--- pt Lab needs dos----  no             Lab results------ pt has current lab results in epic done 05-05-2022 CBCdiff/ CMP and chest CT in epic 01-10-2022 COVID test -----patient states asymptomatic no test needed Arrive at ------- 0800 on 05-19-2022 NPO after MN NO Solid Food.  Clear liquids from MN until--- 0730 Med rec completed Medications to take morning of surgery ----- breztri inhaler Diabetic medication ----- n/a Patient instructed no nail polish to be worn day of surgery Patient instructed to bring photo id and insurance card day of surgery Patient aware to have Driver (ride ) / caregiver   for 24 hours after surgery -- ex-wife, carol Patient Special Instructions ----- will done one fleet enema prior to surgery Pre-Op special Istructions ----- n/a Patient verbalized understanding of instructions that were given at this phone interview. Patient denies shortness of breath, chest pain, fever, cough at this phone interview.   Anesthesia Review:  moderate persistent asthma;  mixed restrictive and obstructive lung disease with emphysema;  hx hepatitis c , treated in 2012 with resolution;  cirrhosis secondary to [facility] / alcohol abuse;  chronic pain w/ metastatic prostate cancer  Pt stated last exacerbation approx 06/ 2023.  Denies sob, difficulty breathing, and wheezing. Pt has breztri inhaler prescribed twice daily however per using as needed, asked pt to do as prescribed starting today until am dos.  Pulmonologist:  Dr Valeta Harms Cassell Clement 03-22-2022 Epic)   Oncologist:  Dr Alen Blew Chest x-ray :  chest CT 01-10-2022 epic EKG :  07-05-2021 epic Abd [place]:  02-23-2018 epic

## 2022-05-05 NOTE — Progress Notes (Signed)
Hematology and Oncology Follow Up Visit  Dylan [name] [id number] [birth year] 61 y.o. 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  He presented with a PSA of 26 and bone involvement including fourth rib, L4 indicating oligometastatic disease.   Prior Therapy:  He is status post L4 bone biopsy completed on March 04, 2022 which confirmed the presence of prostate cancer.  Current therapy:  Firmagon 240 mg started on March 09, 2022.  He received a repeat injection on August 8 via home care agency.  Xtandi 160 milligrams daily started on March 19, 2019 2020.  Interim History: Dylan [name] returns today for a follow-up.  Since last visit, he reports feeling well without any major complaints.  He has tolerated Dylan [name] without any major issues.  He has reported hot flashes but no weight gain.  He denies any excessive fatigue or tiredness.  He denies any bone pain or pathological fractures.  He denies any hospitalizations or illnesses.     Medications: I have reviewed the patient's current medications.  Current Outpatient Medications  Medication Sig Dispense Refill   albuterol (PROVENTIL) (2.5 MG/3ML) 0.083% nebulizer solution Take 3 mLs (2.5 mg total) by nebulization every 6 (six) hours as needed for wheezing or shortness of breath. 75 mL 12   benzonatate (TESSALON) 100 MG capsule Take 1 capsule (100 mg total) by mouth 3 (three) times daily as needed for cough. 30 capsule 0   Budeson-Glycopyrrol-Formoterol (BREZTRI AEROSPHERE) 160-9-4.8 MCG/ACT AERO Inhale 2 puffs into the lungs in the morning and at bedtime. 10.7 g 6   Chlorphen-Phenyleph-ASA (ALKA-SELTZER PLUS COLD) 2-7.8-325 MG TBEF Take 2 tablets by mouth 2 (two) times daily as needed (cold symptoms).     cyclobenzaprine (FLEXERIL) 10 MG tablet Take 1 tablet (10 mg total) by mouth 2 (two) times daily between  meals as needed for muscle spasms. 30 tablet 0   enzalutamide (XTANDI) 40 MG capsule Take 4 capsules (160 mg total) by mouth daily. 120 capsule 0   guaiFENesin (MUCINEX) 600 MG 12 hr tablet Take 1 tablet (600 mg total) by mouth 2 (two) times daily. (Patient taking differently: Take 600 mg by mouth 2 (two) times daily as needed.) 20 tablet 0   ibuprofen (ADVIL) 800 MG tablet Take 1 tablet (800 mg total) by mouth 3 (three) times daily. 21 tablet 0   Oxycodone HCl 10 MG TABS Take 1 tablet by mouth every 6 hours as needed. 90 tablet 0   tamsulosin (FLOMAX) 0.4 MG CAPS capsule Take 1 capsule by mouth at bedtime 90 capsule 3   No current facility-administered medications for this visit.     Allergies:  Allergies  Allergen Reactions   Other Nausea And Vomiting    Narcotics- pt states he does not like to take narcotics d/t makes him sick      Physical Exam: Blood pressure 121/80, pulse 82, temperature 98 F (36.7 C), temperature source Temporal, resp. rate 18, height '5\' 7"'$  (1.702 m), weight 183 lb 14.4 oz (83.4 kg), SpO2 100 %.  ECOG:      General appearance: Comfortable appearing without any discomfort Head: Normocephalic without any trauma Oropharynx: Mucous membranes are moist and pink without any thrush or ulcers. Eyes: Pupils are equal and round reactive to light. Lymph nodes: No cervical, supraclavicular, inguinal or axillary lymphadenopathy.   Heart:regular rate and rhythm.  S1 and S2 without leg edema. Lung:  Clear without any rhonchi or wheezes.  No dullness to percussion. Abdomin: Soft, nontender, nondistended with good bowel sounds.  No hepatosplenomegaly. Musculoskeletal: No joint deformity or effusion.  Full range of motion noted. Neurological: No deficits noted on motor, sensory and deep tendon reflex exam. Skin: No petechial rash or dryness.  Appeared moist.      Lab Results: Lab Results  Component Value Date   WBC 4.1 03/04/2022   HGB 15.4 03/04/2022   HCT 46.7  03/04/2022   MCV 90.7 03/04/2022   PLT 178 03/04/2022     Chemistry      Component Value Date/Time   NA 141 02/11/2022 1625   K 3.9 02/11/2022 1625   CL 108 02/11/2022 1625   CO2 26 02/11/2022 1625   BUN 13 02/11/2022 1625   CREATININE 1.14 02/11/2022 1625   CREATININE 1.18 11/12/2014 1540      Component Value Date/Time   CALCIUM 9.4 02/11/2022 1625   ALKPHOS 66 02/11/2022 1625   AST 35 02/11/2022 1625   ALT 36 02/11/2022 1625   BILITOT 0.8 02/11/2022 1625       Radiological Studies: NM PET (PSMA) SKULL TO MID THIGH  Result Date: 05/04/2022 CLINICAL DATA:  Prostate cancer with bone metastasis. Diagnosed in May with biopsy in July. EXAM: NUCLEAR MEDICINE PET SKULL BASE TO THIGH TECHNIQUE: 8.3 mCi F18 Piflufolastat (Pylarify) was injected intravenously. Full-ring PET imaging was performed from the skull base to thigh after the radiotracer. CT data was obtained and used for attenuation correction and anatomic localization. COMPARISON:  02/16/2022 FDG PET.  Abdominal MRI of 02/16/2022 FINDINGS: NECK No radiotracer activity in neck lymph nodes. Incidental CT finding: No cervical adenopathy. Mucosal thickening of bilateral maxillary sinuses and ethmoid air cells. Clear mastoid air cells. CHEST No radiotracer accumulation within mediastinal or hilar lymph nodes. No suspicious pulmonary nodules on the CT scan. Incidental CT finding: Normal heart size. Mild centrilobular emphysema. ABDOMEN/PELVIS Prostate: Relatively diffuse, bilateral tracer affinity. Example at a S.U.V. max of 7.0. Lymph nodes: No abnormal radiotracer accumulation within pelvic or abdominal nodes. Liver: No evidence of liver metastasis. Incidental CT finding: Advanced cirrhosis and mild hepatic steatosis. Multiple gallstones. Normal adrenal glands. Punctate bilateral renal collecting system calculi. No hydronephrosis. Abdominal aortic atherosclerosis. No abdominopelvic adenopathy. SKELETON Anterior left fourth rib expansile  lesion with increased sclerosis on 88/4. Measures a S.U.V. max of 35.0. Increased sclerosis at the site of an eccentric right posterior L4 metastasis which measures 3.1 x 2.1 cm and a S.U.V. max of 42.8 on 158/4. Compare 3.0 x 2.0 cm on 02/16/2022 (when remeasured). Multiple tracer avid smaller CT occult osseous metastasis, including within ribs calvarium and left sacrum. IMPRESSION: 1. Tracer avid prostate primary or primaries with multifocal osseous metastasis. Direct comparison to the 02/16/2022 V03 PET is not applicable due to differences in tracer. The dominant left fourth rib and L4 lesions have undergone interval sclerosis, suggesting healing. 2. No evidence of extraosseous metastatic disease. 3. Incidental findings, including: Aortic atherosclerosis (ICD10-I70.0) and emphysema (ICD10-J43.9). Advanced cirrhosis. Bilateral nephrolithiasis. Cholelithiasis. Sinus disease. Electronically Signed   By: Abigail Miyamoto M.D.   On: 05/04/2022 11:48     Impression and Plan:   61 year old with:  1.  Prostate cancer diagnosed in July 2023.  He presented with castration-sensitive  with oligometastatic disease involving L4 and fourth rib.  The natural course of this disease was reviewed at this time and treatment choices were discussed.  He is currently on Xtandi which I recommended continuing for at  least 2 years possibly longer.  Complications that include nausea, fatigue and hypertension reiterated.  He is agreeable to continue.     2.  Local therapy: He is currently under evaluation start local therapy in the near future in the care of Dr. Tammi Klippel.  Prostate radiation is indicated given his limited metastatic disease.   3.  Bone directed therapy: Delton See has been referred to obtain dental clearance and if he develops more progressive bone disease.      4 .  Androgen deprivation: He is currently on Firmagon which will be transitioned to Bryson.  Complication including weight gain and hot flashes among  others were reiterated.  He is agreeable to proceed.  We will arrange for Eligard to start in 4 weeks from now.   5. Follow-up: 2 months for a follow-up patient.  30  minutes were dedicated to this encounter.  The time was spent on reviewing laboratory data, disease status update, treatment choices and addressing complication related to cancer and cancer therapy.     Zola Button, MD 9/7/202312:28 PM

## 2022-05-07 LAB — PROSTATE-SPECIFIC AG, SERUM (LABCORP): Prostate Specific Ag, Serum: <0.1 ng/mL (ref 0.0–4.0)

## 2022-05-09 ENCOUNTER — Other Ambulatory Visit (HOSPITAL_COMMUNITY): Payer: Self-pay

## 2022-05-09 ENCOUNTER — Telehealth: Payer: Self-pay | Admitting: *Deleted

## 2022-05-09 ENCOUNTER — Other Ambulatory Visit: Payer: Self-pay | Admitting: Oncology

## 2022-05-09 DIAGNOSIS — C7951 Secondary malignant neoplasm of bone: Secondary | ICD-10-CM

## 2022-05-09 MED ORDER — XTANDI 40 MG PO CAPS
160.0000 mg | ORAL_CAPSULE | Freq: Every day | ORAL | 0 refills | Status: DC
  Filled 2022-05-09: qty 120, 30d supply, fill #0

## 2022-05-09 NOTE — Telephone Encounter (Signed)
Notified of message below

## 2022-05-09 NOTE — Telephone Encounter (Signed)
-----   Message from [name], MD sent at 05/09/2022  8:22 AM EDT ----- Please let him know his PSA is low

## 2022-05-10 ENCOUNTER — Other Ambulatory Visit (HOSPITAL_COMMUNITY): Payer: Self-pay

## 2022-05-10 ENCOUNTER — Telehealth: Payer: Self-pay | Admitting: Oncology

## 2022-05-10 NOTE — Telephone Encounter (Signed)
Scheduled per 09/08 los, patient has been called and notified.

## 2022-05-17 ENCOUNTER — Other Ambulatory Visit: Payer: Self-pay | Admitting: *Deleted

## 2022-05-17 ENCOUNTER — Other Ambulatory Visit (HOSPITAL_COMMUNITY): Payer: Self-pay

## 2022-05-17 DIAGNOSIS — C7951 Secondary malignant neoplasm of bone: Secondary | ICD-10-CM

## 2022-05-17 MED ORDER — OXYCODONE HCL 10 MG PO TABS
10.0000 mg | ORAL_TABLET | Freq: Four times a day (QID) | ORAL | 0 refills | Status: DC | PRN
  Filled 2022-05-17: qty 90, 23d supply, fill #0

## 2022-05-18 ENCOUNTER — Other Ambulatory Visit: Payer: Self-pay | Admitting: Urology

## 2022-05-19 ENCOUNTER — Encounter (HOSPITAL_BASED_OUTPATIENT_CLINIC_OR_DEPARTMENT_OTHER)
Admission: RE | Disposition: A | Discharge status: Home / Self Care | Payer: Self-pay | Source: Home / Self Care | Attending: Urology

## 2022-05-19 ENCOUNTER — Encounter (HOSPITAL_BASED_OUTPATIENT_CLINIC_OR_DEPARTMENT_OTHER): Payer: Self-pay | Admitting: Urology

## 2022-05-19 ENCOUNTER — Ambulatory Visit (HOSPITAL_BASED_OUTPATIENT_CLINIC_OR_DEPARTMENT_OTHER)
Admission: RE | Admit: 2022-05-19 | Discharge: 2022-05-19 | Disposition: A | Discharge status: Home / Self Care | Payer: Commercial Managed Care - HMO | Attending: Urology | Admitting: Urology

## 2022-05-19 ENCOUNTER — Other Ambulatory Visit: Payer: Self-pay

## 2022-05-19 ENCOUNTER — Ambulatory Visit (HOSPITAL_BASED_OUTPATIENT_CLINIC_OR_DEPARTMENT_OTHER): Payer: Commercial Managed Care - HMO | Admitting: Anesthesiology

## 2022-05-19 DIAGNOSIS — C61 Malignant neoplasm of prostate: Secondary | ICD-10-CM | POA: Insufficient documentation

## 2022-05-19 DIAGNOSIS — Z87891 Personal history of nicotine dependence: Secondary | ICD-10-CM

## 2022-05-19 DIAGNOSIS — Z01818 Encounter for other preprocedural examination: Secondary | ICD-10-CM

## 2022-05-19 DIAGNOSIS — J449 Chronic obstructive pulmonary disease, unspecified: Secondary | ICD-10-CM | POA: Diagnosis not present

## 2022-05-19 DIAGNOSIS — K7460 Unspecified cirrhosis of liver: Secondary | ICD-10-CM | POA: Diagnosis not present

## 2022-05-19 HISTORY — DX: Other chronic pain: G89.29

## 2022-05-19 HISTORY — PX: GOLD SEED IMPLANT: SHX6343

## 2022-05-19 HISTORY — DX: Unspecified cirrhosis of liver: K74.60

## 2022-05-19 HISTORY — DX: Moderate persistent asthma, uncomplicated: J45.40

## 2022-05-19 HISTORY — DX: Emphysema, unspecified: J43.9

## 2022-05-19 HISTORY — DX: Other psychoactive substance abuse, in remission: F19.11

## 2022-05-19 HISTORY — PX: SPACE OAR INSTILLATION: SHX6769

## 2022-05-19 HISTORY — DX: Centrilobular emphysema: J43.2

## 2022-05-19 HISTORY — DX: Benign prostatic hyperplasia with lower urinary tract symptoms: N40.1

## 2022-05-19 HISTORY — DX: Chronic obstructive pulmonary disease, unspecified: J98.4

## 2022-05-19 HISTORY — DX: Personal history of other infectious and parasitic diseases: Z86.19

## 2022-05-19 HISTORY — DX: Male erectile dysfunction, unspecified: N52.9

## 2022-05-19 HISTORY — DX: Chronic obstructive pulmonary disease, unspecified: J44.9

## 2022-05-19 SURGERY — INSERTION, GOLD SEEDS
Anesthesia: Monitor Anesthesia Care | Site: Prostate

## 2022-05-19 MED ORDER — ONDANSETRON HCL 4 MG/2ML IJ SOLN: 4.0000 mg | Freq: Once | INTRAMUSCULAR | Status: DC | PRN

## 2022-05-19 MED ORDER — SODIUM CHLORIDE (PF) 0.9 % IJ SOLN
INTRAMUSCULAR | Status: DC | PRN
  Administered 2022-05-19: 10 mL

## 2022-05-19 MED ORDER — FLEET ENEMA 7-19 GM/118ML RE ENEM: 1.0000 | ENEMA | Freq: Once | RECTAL | Status: DC

## 2022-05-19 MED ORDER — CEFAZOLIN SODIUM-DEXTROSE 2-4 GM/100ML-% IV SOLN
2.0000 g | Freq: Once | INTRAVENOUS | Status: AC
  Administered 2022-05-19: 2 g via INTRAVENOUS

## 2022-05-19 MED ORDER — FENTANYL CITRATE (PF) 100 MCG/2ML IJ SOLN: 25.0000 ug | INTRAMUSCULAR | Status: DC | PRN

## 2022-05-19 MED ORDER — LIDOCAINE HCL (CARDIAC) PF 100 MG/5ML IV SOSY
PREFILLED_SYRINGE | INTRAVENOUS | Status: DC | PRN
  Administered 2022-05-19: 40 mg via INTRAVENOUS

## 2022-05-19 MED ORDER — FENTANYL CITRATE (PF) 100 MCG/2ML IJ SOLN
INTRAMUSCULAR | Status: AC
  Filled 2022-05-19: qty 2

## 2022-05-19 MED ORDER — PROPOFOL 10 MG/ML IV BOLUS
INTRAVENOUS | Status: DC | PRN
  Administered 2022-05-19 (×2): 10 mg via INTRAVENOUS

## 2022-05-19 MED ORDER — PROPOFOL 500 MG/50ML IV EMUL
INTRAVENOUS | Status: DC | PRN
  Administered 2022-05-19: 150 ug/kg/min via INTRAVENOUS

## 2022-05-19 MED ORDER — MIDAZOLAM HCL 2 MG/2ML IJ SOLN
INTRAMUSCULAR | Status: DC | PRN
  Administered 2022-05-19: 1 mg via INTRAVENOUS

## 2022-05-19 MED ORDER — MIDAZOLAM HCL 2 MG/2ML IJ SOLN
INTRAMUSCULAR | Status: AC
  Filled 2022-05-19: qty 2

## 2022-05-19 MED ORDER — OXYCODONE HCL 5 MG/5ML PO SOLN: 5.0000 mg | Freq: Once | ORAL | Status: DC | PRN

## 2022-05-19 MED ORDER — CEFAZOLIN SODIUM-DEXTROSE 2-4 GM/100ML-% IV SOLN
INTRAVENOUS | Status: AC
  Filled 2022-05-19: qty 100

## 2022-05-19 MED ORDER — ONDANSETRON HCL 4 MG/2ML IJ SOLN
INTRAMUSCULAR | Status: DC | PRN
  Administered 2022-05-19: 4 mg via INTRAVENOUS

## 2022-05-19 MED ORDER — DEXMEDETOMIDINE HCL IN NACL 200 MCG/50ML IV SOLN
INTRAVENOUS | Status: DC | PRN
  Administered 2022-05-19: 4 ug via INTRAVENOUS

## 2022-05-19 MED ORDER — LIDOCAINE HCL 2 % IJ SOLN
INTRAMUSCULAR | Status: DC | PRN
  Administered 2022-05-19: 10 mL

## 2022-05-19 MED ORDER — AMISULPRIDE (ANTIEMETIC) 5 MG/2ML IV SOLN: 10.0000 mg | Freq: Once | INTRAVENOUS | Status: DC | PRN

## 2022-05-19 MED ORDER — LACTATED RINGERS IV SOLN: INTRAVENOUS | Status: DC

## 2022-05-19 MED ORDER — OXYCODONE HCL 5 MG PO TABS: 5.0000 mg | ORAL_TABLET | Freq: Once | ORAL | Status: DC | PRN

## 2022-05-19 SURGICAL SUPPLY — 23 items
BLADE CLIPPER SENSICLIP SURGIC (BLADE) ×1 IMPLANT
CNTNR URN SCR LID CUP LEK RST (MISCELLANEOUS) ×1 IMPLANT
CONT SPEC 4OZ STRL OR WHT (MISCELLANEOUS) ×1
COVER BACK TABLE 60X90IN (DRAPES) ×1 IMPLANT
DRSG TEGADERM 4X4.75 (GAUZE/BANDAGES/DRESSINGS) ×1 IMPLANT
DRSG TEGADERM 8X12 (GAUZE/BANDAGES/DRESSINGS) ×1 IMPLANT
GAUZE SPONGE 4X4 12PLY STRL (GAUZE/BANDAGES/DRESSINGS) ×1 IMPLANT
GLOVE BIO SURGEON STRL SZ8 (GLOVE) ×1 IMPLANT
GLOVE BIOGEL PI IND STRL 6 (GLOVE) IMPLANT
GLOVE ECLIPSE 8.0 STRL XLNG CF (GLOVE) ×1 IMPLANT
IMPL SPACEOAR VUE SYSTEM (Spacer) ×1 IMPLANT
IMPLANT SPACEOAR VUE SYSTEM (Spacer) ×1 IMPLANT
KIT TURNOVER CYSTO (KITS) ×1 IMPLANT
MARKER GOLD PRELOAD 1.2X3 (Urological Implant) ×1 IMPLANT
MARKER SKIN DUAL TIP RULER LAB (MISCELLANEOUS) ×1 IMPLANT
NDL SPNL 22GX3.5 QUINCKE BK (NEEDLE) ×1 IMPLANT
NEEDLE SPNL 22GX3.5 QUINCKE BK (NEEDLE) ×1 IMPLANT
SEED GOLD PRELOAD 1.2X3 (Urological Implant) ×1 IMPLANT
SHEATH ULTRASOUND LTX NONSTRL (SHEATH) IMPLANT
SURGILUBE 2OZ TUBE FLIPTOP (MISCELLANEOUS) ×1 IMPLANT
SYR CONTROL 10ML LL (SYRINGE) ×1 IMPLANT
TOWEL OR 17X26 10 PK STRL BLUE (TOWEL DISPOSABLE) ×1 IMPLANT
UNDERPAD 30X36 HEAVY ABSORB (UNDERPADS AND DIAPERS) ×1 IMPLANT

## 2022-05-19 NOTE — Anesthesia Postprocedure Evaluation (Signed)
Anesthesia Post Note  Patient: KEYSHUN ELPERS  Procedure(s) Performed: GOLD SEED IMPLANT (Prostate) SPACE OAR INSTILLATION (Prostate)     Patient location during evaluation: PACU Anesthesia Type: MAC Level of consciousness: awake and alert Pain management: pain level controlled Vital Signs Assessment: post-procedure vital signs reviewed and stable Respiratory status: spontaneous breathing, nonlabored ventilation and respiratory function stable Cardiovascular status: blood pressure returned to baseline and stable Postop Assessment: no apparent nausea or vomiting Anesthetic complications: no   No notable events documented.  Last Vitals:  Vitals:   05/19/22 1130 05/19/22 1143  BP: (!) 149/96 138/81  Pulse: 77 79  Resp: 11 12  Temp:  36.6 C  SpO2: 97% 96%    Last Pain:  Vitals:   05/19/22 1143  TempSrc: Oral  PainSc: 0-No pain                 Lidia Collum

## 2022-05-19 NOTE — Anesthesia Preprocedure Evaluation (Signed)
Anesthesia Evaluation  Patient identified by MRN, date of birth, ID band Patient awake    Reviewed: Allergy & Precautions, NPO status , Patient's Chart, lab work & pertinent test results  History of Anesthesia Complications Negative for: history of anesthetic complications  Airway Mallampati: II  TM Distance: >3 FB Neck ROM: Full    Dental   Pulmonary asthma , COPD, former smoker,    Pulmonary exam normal        Cardiovascular negative cardio ROS Normal cardiovascular exam     Neuro/Psych negative neurological ROS     GI/Hepatic GERD  ,(+) Cirrhosis     substance abuse  , Hepatitis -, C  Endo/Other  negative endocrine ROS  Renal/GU Renal InsufficiencyRenal disease (Cr 1.32)   Prostate cancer    Musculoskeletal negative musculoskeletal ROS (+)   Abdominal   Peds  Hematology negative hematology ROS (+)   Anesthesia Other Findings   Reproductive/Obstetrics                             Anesthesia Physical Anesthesia Plan  ASA: 3  Anesthesia Plan: MAC   Post-op Pain Management: Minimal or no pain anticipated   Induction: Intravenous  PONV Risk Score and Plan: 1 and Propofol infusion, TIVA and Treatment may vary due to age or medical condition  Airway Management Planned: Natural Airway, Nasal Cannula and Simple Face Mask  Additional Equipment: None  Intra-op Plan:   Post-operative Plan:   Informed Consent: I have reviewed the patients History and Physical, chart, labs and discussed the procedure including the risks, benefits and alternatives for the proposed anesthesia with the patient or authorized representative who has indicated his/her understanding and acceptance.       Plan Discussed with:   Anesthesia Plan Comments:         Anesthesia Quick Evaluation

## 2022-05-19 NOTE — H&P (Signed)
H&P  Chief Complaint: Prostate cancer  History of Present Illness: 61 year old male with oligometastatic prostate cancer.  Known rib foci, but PET scan showed no other metastatic disease.  He presents at this time for placement of fiducial markers and SpaceOAR in advance of external beam radiotherapy.  Past Medical History:  Diagnosis Date   Benign localized prostatic hyperplasia with lower urinary tract symptoms (LUTS)    followed by dr gay   Centrilobular emphysema [facility])    Cholelithiasis    Chronic pain    due to bone mets,  left side rib, back, pelvis   CL (cirrhosis of liver) (HCC)    secondary to hx HCV, alcohol and drug abuse   ED (erectile dysfunction)    History of COVID-19 06/2019   covid pneumonia w/ acute respriratory failure , hospital admission   History of drug abuse ([place])    per pt last used drugs late 1980s stated was only marjiuana/ alcohol, however,  documentation in epic states also did acid/ cocaine/ iv drug use   History of hepatitis C    per gi note due to hx iv drug use (pt denies hx IV drug use) ;   treated in 2012 with resolution per last GI progress office note in epic 06-20-2018   Mixed restrictive and obstructive lung disease ([place])    pulmologist--- dr Valeta Harms;   no oxygen   Moderate persistent asthma    followed by dr icard   PONV (postoperative nausea and vomiting)    Prostate cancer metastatic to bone [facility]) 03/08/2022   primary urologist-- dr gay/  oncologist--- dr Alen Blew;   pt dx 07/ 2023 via bone bx  lesion,  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 (left 4th rib/ L4)    Past Surgical History:  Procedure Laterality Date   COLONOSCOPY WITH PROPOFOL N/A 12/11/2014   Procedure: COLONOSCOPY WITH PROPOFOL;  Surgeon: Inda Castle, MD;  Location: [facility] ENDOSCOPY;  Service: Endoscopy;  Laterality: N/A;   ESOPHAGEAL BANDING N/A 12/11/2014   Procedure: ESOPHAGEAL BANDING;  Surgeon: Inda Castle, MD;  Location: [place];  Service: Endoscopy;   Laterality: N/A;   ESOPHAGOGASTRODUODENOSCOPY N/A 06/06/2017   Procedure: ESOPHAGOGASTRODUODENOSCOPY (EGD);  Surgeon: Yetta Flock, MD;  Location: [facility] END[facility];  Service: Gastroenterology;  Laterality: N/A;   ESOPHAGOGASTRODUODENOSCOPY (EGD) WITH PROPOFOL N/A 12/11/2014   Procedure: ESOPHAGOGASTRODUODENOSCOPY (EGD) WITH PROPOFOL;  Surgeon: Inda Castle, MD;  Location: [place];  Service: Endoscopy;  Laterality: N/A;   KNEE ARTHROSCOPY Right    1980s    Home Medications:    Allergies:  Allergies  Allergen Reactions   Other Nausea And Vomiting    Narcotics- pt states he does not like to take narcotics d/t makes him sick    Family History  Problem Relation Age of Onset   Diabetes Mother    Alzheimer's disease Mother    Diabetes Maternal Grandmother    Mesothelioma Father    Colon cancer Neg Hx    Stomach cancer Neg Hx     Social History:  reports that he quit smoking about 7 years ago. His smoking use included cigarettes. He has a 20.00 pack-year smoking history. He has never used smokeless tobacco. He reports that he does not currently use alcohol after a past usage of about 2.0 standard drinks of alcohol per week. He reports that he does not currently use drugs.  ROS: A complete review of systems was performed.  All systems are negative except for pertinent findings as noted.  Physical Exam:  Vital signs in last 24 hours: BP (!) 145/96   Pulse 88   Temp 98.3 F (36.8 C) (Oral)   Resp 16   Ht '5\' 7"'$  (1.702 m)   Wt 82.6 kg   SpO2 99%   BMI 28.51 kg/m  Constitutional:  Alert and oriented, No acute distress Cardiovascular: Regular rate  Respiratory: Normal respiratory effort Neurologic: Grossly intact, no focal deficits Psychiatric: Normal mood and affect  I have reviewed prior pt notes  I have reviewed notes from referring/previous physicians  I have reviewed urinalysis results  I have independently reviewed prior imaging  I have reviewed prior  PSA results  I have reviewed prior urine culture   Impression/Assessment:  Oligometastatic prostate cancer  Plan:  Placement of fiducial markers and SpaceOAR

## 2022-05-19 NOTE — Interval H&P Note (Signed)
History and Physical Interval Note:  05/19/2022 9:54 AM  Dylan [name]  has presented today for surgery, with the diagnosis of PROSTATE CANCER.  The various methods of treatment have been discussed with the patient and family. After consideration of risks, benefits and other options for treatment, the patient has consented to  Procedure(s) with comments: GOLD SEED IMPLANT (N/A) - ONLY NEEDS 30 MIN SPACE OAR INSTILLATION (N/A) as a surgical intervention.  The patient's history has been reviewed, patient examined, no change in status, stable for surgery.  I have reviewed the patient's chart and labs.  Questions were answered to the patient's satisfaction.     Lillette Boxer Demetres Prochnow

## 2022-05-19 NOTE — Discharge Instructions (Addendum)
You can resume light activities on Friday  It is okay to go back to work with no restrictions on Monday  Report any problems urinating or having a bowel movement or fevers        Post Anesthesia Home Care Instructions  Activity: Get plenty of rest for the remainder of the day. A responsible individual must stay with you for 24 hours following the procedure.  For the next 24 hours, DO NOT: -Drive a car -Paediatric nurse -Drink alcoholic beverages -Take any medication unless instructed by your physician -Make any legal decisions or sign important papers.  Meals: Start with liquid foods such as gelatin or soup. Progress to regular foods as tolerated. Avoid greasy, spicy, heavy foods. If nausea and/or vomiting occur, drink only clear liquids until the nausea and/or vomiting subsides. Call your physician if vomiting continues.  Special Instructions/Symptoms: Your throat may feel dry or sore from the anesthesia or the breathing tube placed in your throat during surgery. If this causes discomfort, gargle with warm salt water. The discomfort should disappear within 24 hours.

## 2022-05-19 NOTE — Transfer of Care (Signed)
Immediate Anesthesia Transfer of Care Note  Patient: Dylan [name]  Procedure(s) Performed: GOLD SEED IMPLANT (Prostate) SPACE OAR INSTILLATION (Prostate)  Patient Location: PACU  Anesthesia Type:MAC  Level of Consciousness: awake, alert  and patient cooperative  Airway & Oxygen Therapy: Patient Spontanous Breathing  Post-op Assessment: Report given to RN and Post -op Vital signs reviewed and stable  Post vital signs: Reviewed and stable  Last Vitals:  Vitals Value Taken Time  BP    Temp    Pulse 92 05/19/22 1049  Resp    SpO2 95 % 05/19/22 1049  Vitals shown include unvalidated device data.  Last Pain:  Vitals:   05/19/22 0902  TempSrc: Oral  PainSc: 0-No pain      Patients Stated Pain Goal: 5 (29/57/47 3403)  Complications: No notable events documented.

## 2022-05-19 NOTE — Op Note (Signed)
Preoperative diagnosis: Oligometastatic prostate cancer  Postoperative diagnosis: Same   Procedure: Placement of gold fiducial markers, placement of Space OAR  Surgeon: Lillette Boxer. Dalyce Renne M.D  Anesthesia: MAC  Indications: Patient  was diagnosed with oligometastatic prostate cancer.  He has initiated androgen deprivation therapy which will be followed with radiotherapy to a vertebral and rib lesion.  He will also proceed with external beam radiotherapy to the prostate.  He presents now for placement of fiducial markers and Space OAR prior to his external beam radiotherapy.   Technique and findings: Patient was brought the operating room where he had successful induction of MAC. He was placed in dorso-lithotomy position and prepped in the usual manner.  Timeout was then performed.  10 cc of 2% lidocaine was utilized to infiltrate the perineum up to the apex of the prostate.  Gold fiducial markers were then placed, 2 in the right prostate, one at the base and one at the apex, as well as one in the left prostate.  These were placed through the perineum. I then proceeded with placement of SpaceOAR by introducing a needle with the bevel angled inferiorly approximately 2 cm superior to the anus. This was angled downward and under direct ultrasound was placed within the space between the prostatic capsule and rectum. This was confirmed with a small amount of sterile saline injected and this was performed under direct ultrasound. I then attached the SpaceOAR to the needle and injected this in the space between the prostate and rectum with good placement noted.  The patient was brought to recovery room in stable condition, having tolerated the procedure well.Marland Kitchen

## 2022-05-20 ENCOUNTER — Telehealth: Payer: Self-pay | Admitting: *Deleted

## 2022-05-20 ENCOUNTER — Encounter (HOSPITAL_BASED_OUTPATIENT_CLINIC_OR_DEPARTMENT_OTHER): Payer: Self-pay | Admitting: Urology

## 2022-05-20 NOTE — Telephone Encounter (Signed)
RETURNED PATIENT'S PHONE CALL, LVM FOR A RETURN CALL 

## 2022-05-23 ENCOUNTER — Telehealth: Payer: Self-pay | Admitting: *Deleted

## 2022-05-23 ENCOUNTER — Telehealth: Payer: Self-pay

## 2022-05-23 NOTE — Telephone Encounter (Signed)
Patient called needed work excuse note for today.  Patient stated he was having some discomfort from recent gold seed implant and didn't think it would be wise to go to work today.  Will come to Cancer center to pick up note at front desk.  Nothing else follows.

## 2022-05-23 NOTE — Telephone Encounter (Signed)
Returned patient's phone call, spoke with patient 

## 2022-05-23 NOTE — Telephone Encounter (Signed)
"  GIANCARLOS BERENDT [phone]) calling for forms left for Dr. Alen Blew.  What is the state?  Need to return today so I can maintain employment."  Connected with ARNO CULLERS advised form received by form staff 05/19/2022.  We ask to allow 7 - 10 business days (14 calendar) to process with signed [place] for Use/Disclosure for third party.  Forms completed in order received with currently 27 ahead of his leave request.  Provider also not in office to sign if form expedited.   "No one informed me to sign authorization or process when I brought form to receptionist desks.  Today was my return to workday after gold seeds were planted.  I am still in pain.  Called in today and told my employment is now in jeopardy.  Is there any way to receive a note or something to cover today to take to employer tomorrow?" Call transferred to radiation collaborative to assist further.

## 2022-05-26 ENCOUNTER — Telehealth: Payer: Self-pay | Admitting: *Deleted

## 2022-05-26 ENCOUNTER — Ambulatory Visit: Payer: Commercial Managed Care - HMO | Admitting: Radiation Oncology

## 2022-05-26 NOTE — Telephone Encounter (Signed)
Called patient to remind of sim for 05-27-22- arrival time- 12:45 pm @ [facility], informed patient to arrive with a full bladder, and I also informed him of the sim for 05-27-22 @ 3 pm, spoke with patient and he is aware of these sim appts. and the instructions

## 2022-05-27 ENCOUNTER — Ambulatory Visit
Admission: RE | Admit: 2022-05-27 | Discharge: 2022-05-27 | Disposition: A | Discharge status: Home / Self Care | Payer: Commercial Managed Care - HMO | Source: Ambulatory Visit | Attending: Radiation Oncology | Admitting: Radiation Oncology

## 2022-05-27 ENCOUNTER — Ambulatory Visit: Payer: Commercial Managed Care - HMO | Admitting: Radiation Oncology

## 2022-05-27 DIAGNOSIS — C61 Malignant neoplasm of prostate: Secondary | ICD-10-CM | POA: Diagnosis present

## 2022-05-27 DIAGNOSIS — C7951 Secondary malignant neoplasm of bone: Secondary | ICD-10-CM | POA: Diagnosis present

## 2022-05-27 NOTE — Progress Notes (Signed)
  Radiation Oncology         [phone] ________________________________  Name: Dylan [name] MRN: [id number]  Date: 05/27/2022  DOB: Nov 10, 1960  SIMULATION AND TREATMENT PLANNING NOTE    ICD-10-CM   1. Prostate cancer metastatic to bone [facility])  C61    C79.51       DIAGNOSIS:  61 y/o male with advanced, oligometastatic prostate cancer involving a left 4th rib and L4 vertebral body.  NARRATIVE:  The patient was brought to the [place].  Identity was confirmed.  All relevant records and images related to the planned course of therapy were reviewed.  The patient freely provided informed written consent to proceed with treatment after reviewing the details related to the planned course of therapy. The consent form was witnessed and verified by the simulation staff.  Then, the patient was set-up in a stable reproducible supine position for radiation therapy.  A vacuum lock pillow device was custom fabricated to position his legs in a reproducible immobilized position.  Then, I performed a urethrogram under sterile conditions to identify the prostatic apex.  CT images were obtained.  Surface markings were placed.  The CT images were loaded into the planning software.  Then the prostate target and avoidance structures including the rectum, bladder, bowel and hips were contoured.  Treatment planning then occurred.  The radiation prescription was entered and confirmed.  A total of one complex treatment devices was fabricated. I have requested : Intensity Modulated Radiotherapy (IMRT) is medically necessary for this case for the following reason:  Rectal sparing.Marland Kitchen  PLAN:   The prostate, seminal vesicles, pelvic lymph nodes and oligometastasis at L4 will initially be treated to 45 Gy in 25 fractions of 1.8 Gy followed by a boost to the prostate and L4 vertebral body, to 75 Gy with 15 additional fractions of 2.0 Gy while simultaneously treating the oligometastasis in the left 4th rib to 40  Gy in 5 fractions.  ________________________________  Sheral Apley Tammi Klippel, M.D.

## 2022-05-27 NOTE — Progress Notes (Signed)
  Radiation Oncology         [phone] ________________________________  Name: Dylan [name] MRN: [id number]  Date: 05/27/2022  DOB: 04-30-1961  STEREOTACTIC BODY RADIOTHERAPY SIMULATION AND TREATMENT PLANNING NOTE    ICD-10-CM   1. 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  NARRATIVE:  The patient was brought to the [facility].  Identity was confirmed.  All relevant records and images related to the planned course of therapy were reviewed.  The patient freely provided informed written consent to proceed with treatment after reviewing the details related to the planned course of therapy. The consent form was witnessed and verified by the simulation staff.  Then, the patient was set-up in a stable reproducible  supine position for radiation therapy.  A BodyFix immobilization pillow was fabricated for reproducible positioning.  Then I personally applied the abdominal compression paddle to limit respiratory excursion.  4D respiratoy motion management CT images were obtained.  Surface markings were placed.  The CT images were loaded into the planning software.  Then, using Cine, MIP, and standard views, the internal target volume (ITV) and planning target volumes (PTV) were delinieated, and avoidance structures were contoured.  Treatment planning then occurred.  The radiation prescription was entered and confirmed.  A total of two complex treatment devices were fabricated in the form of the BodyFix immobilization pillow and a neck accuform cushion.  I have requested : 3D Simulation  I have requested a DVH of the following structures: Heart, Lungs, Esophagus, Chest Wall, Brachial Plexus, Major Blood Vessels, and targets.  SPECIAL TREATMENT PROCEDURE:  The planned course of therapy using radiation constitutes a special treatment procedure.  Special care is required in the management of this patient for the following reasons. This treatment constitutes a Special Treatment Procedure for the following reason: [ High dose per fraction requiring special monitoring for increased toxicities of treatment including daily imaging..  The special nature of the planned course of radiotherapy will require increased physician supervision and oversight to ensure patient's safety with optimal treatment outcomes.  This requires extended time and effort.    RESPIRATORY MOTION MANAGEMENT SIMULATION:  In order to account for effect of respiratory motion on target structures and other organs in the planning and delivery of radiotherapy, this patient underwent respiratory motion management simulation.  To accomplish this, when the patient was brought to the CT simulation planning suite, 4D respiratoy motion management CT images were obtained.  The CT images were loaded into the planning software.  Then, using a variety of tools including Cine, MIP, and standard views, the target volume and planning target volumes (PTV) were delineated.  Avoidance structures were contoured.  Treatment planning then occurred.  Dose volume histograms were generated and reviewed for each of the requested structure.  The resulting plan was carefully reviewed and approved today.  PLAN:  The oligometastasis in the left 4th rib will be treated to 40 Gy in 5 fractions of 8 Gy each, simultaneous to IMRT to the prostate, pelvic nodes and L4.  ________________________________  Sheral Apley Tammi Klippel, M.D.

## 2022-05-29 DIAGNOSIS — C7951 Secondary malignant neoplasm of bone: Secondary | ICD-10-CM | POA: Diagnosis present

## 2022-05-29 DIAGNOSIS — C61 Malignant neoplasm of prostate: Secondary | ICD-10-CM | POA: Diagnosis present

## 2022-05-29 DIAGNOSIS — Z510 Encounter for antineoplastic radiation therapy: Secondary | ICD-10-CM | POA: Insufficient documentation

## 2022-05-30 NOTE — Telephone Encounter (Signed)
Collaborative nurse asked this nurse to call Dylan [name].  Reports he called earlier today about FMLA.  Unable to connect.  Received message to "e-mail [place] [facility] to be signed.  Sent via patient portal for patient signature.

## 2022-05-31 ENCOUNTER — Other Ambulatory Visit (HOSPITAL_COMMUNITY): Payer: Self-pay

## 2022-05-31 ENCOUNTER — Other Ambulatory Visit: Payer: Self-pay | Admitting: Oncology

## 2022-05-31 DIAGNOSIS — C61 Malignant neoplasm of prostate: Secondary | ICD-10-CM

## 2022-05-31 MED ORDER — XTANDI 40 MG PO CAPS
160.0000 mg | ORAL_CAPSULE | Freq: Every day | ORAL | 0 refills | Status: DC
  Filled 2022-05-31: qty 120, 30d supply, fill #0

## 2022-05-31 MED ORDER — MELOXICAM 15 MG PO TABS
15.0000 mg | ORAL_TABLET | Freq: Every day | ORAL | 0 refills | Status: DC
  Filled 2022-05-31: qty 30, 30d supply, fill #0

## 2022-06-02 ENCOUNTER — Telehealth: Payer: Self-pay | Admitting: *Deleted

## 2022-06-02 NOTE — Telephone Encounter (Signed)
Late entry for 06/01/22 -Contacted by [place], Vi Doenges, Rph to confirm that patient was changing to [place] from [place].  [place] requested fax of treatment plan containing information on new med being prescribed, most recent OV and labs faxed to [phone].   06/02/22 Received faxed order with request for Dr. Hazeline Junker review and signature for Eligard and support meds. Order signed and faxed back to same number. Fax confirmation received.  Per pharmacist, once signed order received by [facility], they will order Eligard and arrange shipment to patient.  Med pending auth from [place]. [employer] CM will arrange/schedule home health nurse to administer med to patient in his home.  [employer] CM Kristin contact # [phone] x [phone].   Patient updated on process as noted above.

## 2022-06-03 NOTE — Telephone Encounter (Signed)
Per Kathyrn Lass:  Pt is approved with [employer], notes are in Epic. [employer] will be reaching out to coordinate with pharmacy for addtl home service approval.

## 2022-06-06 ENCOUNTER — Telehealth: Payer: Self-pay | Admitting: *Deleted

## 2022-06-06 NOTE — Telephone Encounter (Signed)
Connected with [facility] [phone] (home) .  Asked to sign [facility] Authorization for Use and disclosure and the [facility] Cover sheet tomorrow at office entry receptionist desk.      1338: Message left for XRT collaborative [phone]) to ensure above completed and returned to this forms nurse.  Form completed by this nurse 06/01/2022 to provider for review and signature.  Returned to this nurse 06/02/2022.  E-mailed to [email or web address] 06/02/2022.  Receipt confirmed by 23/12/2021 during call.

## 2022-06-07 ENCOUNTER — Ambulatory Visit
Admission: RE | Admit: 2022-06-07 | Discharge: 2022-06-07 | Disposition: A | Discharge status: Home / Self Care | Payer: Commercial Managed Care - HMO | Source: Ambulatory Visit | Attending: Radiation Oncology | Admitting: Radiation Oncology

## 2022-06-07 ENCOUNTER — Other Ambulatory Visit: Payer: Self-pay

## 2022-06-07 DIAGNOSIS — Z510 Encounter for antineoplastic radiation therapy: Secondary | ICD-10-CM | POA: Diagnosis not present

## 2022-06-07 LAB — RAD ONC ARIA SESSION SUMMARY
Course Elapsed Days: 0
Plan Fractions Treated to Date: 1
Plan Prescribed Dose Per Fraction: 1.8 Gy
Plan Total Fractions Prescribed: 25
Plan Total Prescribed Dose: 45 Gy
Reference Point Dosage Given to Date: 1.8 Gy
Reference Point Session Dosage Given: 1.8 Gy
Session Number: 1

## 2022-06-08 ENCOUNTER — Ambulatory Visit
Admission: RE | Admit: 2022-06-08 | Payer: Commercial Managed Care - HMO | Source: Ambulatory Visit | Admitting: Radiation Oncology

## 2022-06-08 ENCOUNTER — Ambulatory Visit: Admission: RE | Admit: 2022-06-08 | Payer: Commercial Managed Care - HMO | Source: Ambulatory Visit

## 2022-06-08 ENCOUNTER — Other Ambulatory Visit: Payer: Self-pay

## 2022-06-08 DIAGNOSIS — Z510 Encounter for antineoplastic radiation therapy: Secondary | ICD-10-CM | POA: Diagnosis not present

## 2022-06-08 LAB — RAD ONC ARIA SESSION SUMMARY
Course Elapsed Days: 1
Plan Fractions Treated to Date: 2
Plan Prescribed Dose Per Fraction: 1.8 Gy
Plan Total Fractions Prescribed: 25
Plan Total Prescribed Dose: 45 Gy
Reference Point Dosage Given to Date: 3.6 Gy
Reference Point Session Dosage Given: 1.8 Gy
Session Number: 2

## 2022-06-09 ENCOUNTER — Ambulatory Visit: Payer: Commercial Managed Care - HMO | Admitting: Radiation Oncology

## 2022-06-09 ENCOUNTER — Ambulatory Visit
Admission: RE | Admit: 2022-06-09 | Discharge: 2022-06-09 | Disposition: A | Discharge status: Home / Self Care | Payer: Commercial Managed Care - HMO | Source: Ambulatory Visit | Attending: Radiation Oncology | Admitting: Radiation Oncology

## 2022-06-09 ENCOUNTER — Other Ambulatory Visit (HOSPITAL_COMMUNITY): Payer: Self-pay

## 2022-06-09 ENCOUNTER — Ambulatory Visit
Admission: RE | Admit: 2022-06-09 | Payer: Commercial Managed Care - HMO | Source: Ambulatory Visit | Admitting: Radiation Oncology

## 2022-06-09 ENCOUNTER — Other Ambulatory Visit: Payer: Self-pay

## 2022-06-09 ENCOUNTER — Other Ambulatory Visit: Payer: Self-pay | Admitting: *Deleted

## 2022-06-09 DIAGNOSIS — Z510 Encounter for antineoplastic radiation therapy: Secondary | ICD-10-CM | POA: Diagnosis not present

## 2022-06-09 DIAGNOSIS — C7951 Secondary malignant neoplasm of bone: Secondary | ICD-10-CM

## 2022-06-09 LAB — RAD ONC ARIA SESSION SUMMARY
Course Elapsed Days: 2
Plan Fractions Treated to Date: 3
Plan Prescribed Dose Per Fraction: 1.8 Gy
Plan Total Fractions Prescribed: 25
Plan Total Prescribed Dose: 45 Gy
Reference Point Dosage Given to Date: 5.4 Gy
Reference Point Session Dosage Given: 1.8 Gy
Session Number: 3

## 2022-06-09 MED ORDER — ENZALUTAMIDE 40 MG PO TABS
160.0000 mg | ORAL_TABLET | Freq: Every day | ORAL | 0 refills | Status: DC
  Filled 2022-06-09: qty 120, 30d supply, fill #0

## 2022-06-10 ENCOUNTER — Ambulatory Visit: Admission: RE | Admit: 2022-06-10 | Payer: Commercial Managed Care - HMO | Source: Ambulatory Visit

## 2022-06-10 ENCOUNTER — Ambulatory Visit
Admission: RE | Admit: 2022-06-10 | Discharge: 2022-06-10 | Disposition: A | Discharge status: Home / Self Care | Payer: Commercial Managed Care - HMO | Source: Ambulatory Visit | Attending: Radiation Oncology | Admitting: Radiation Oncology

## 2022-06-10 ENCOUNTER — Other Ambulatory Visit: Payer: Self-pay

## 2022-06-10 ENCOUNTER — Ambulatory Visit: Payer: Commercial Managed Care - HMO

## 2022-06-10 DIAGNOSIS — C61 Malignant neoplasm of prostate: Secondary | ICD-10-CM

## 2022-06-10 DIAGNOSIS — Z510 Encounter for antineoplastic radiation therapy: Secondary | ICD-10-CM | POA: Diagnosis not present

## 2022-06-10 LAB — RAD ONC ARIA SESSION SUMMARY
Course Elapsed Days: 3
Plan Fractions Treated to Date: 1
Plan Prescribed Dose Per Fraction: 8 Gy
Plan Total Fractions Prescribed: 5
Plan Total Prescribed Dose: 40 Gy
Reference Point Dosage Given to Date: 8 Gy
Reference Point Session Dosage Given: 8 Gy
Session Number: 4

## 2022-06-13 ENCOUNTER — Ambulatory Visit
Admission: RE | Admit: 2022-06-13 | Discharge: 2022-06-13 | Disposition: A | Discharge status: Home / Self Care | Payer: Commercial Managed Care - HMO | Source: Ambulatory Visit | Attending: Radiation Oncology | Admitting: Radiation Oncology

## 2022-06-13 ENCOUNTER — Ambulatory Visit
Admission: RE | Admit: 2022-06-13 | Payer: Commercial Managed Care - HMO | Source: Ambulatory Visit | Admitting: Radiation Oncology

## 2022-06-13 ENCOUNTER — Other Ambulatory Visit: Payer: Self-pay

## 2022-06-13 ENCOUNTER — Telehealth: Payer: Self-pay | Admitting: *Deleted

## 2022-06-13 DIAGNOSIS — Z510 Encounter for antineoplastic radiation therapy: Secondary | ICD-10-CM | POA: Diagnosis not present

## 2022-06-13 LAB — RAD ONC ARIA SESSION SUMMARY
Course Elapsed Days: 6
Plan Fractions Treated to Date: 5
Plan Prescribed Dose Per Fraction: 1.8 Gy
Plan Total Fractions Prescribed: 25
Plan Total Prescribed Dose: 45 Gy
Reference Point Dosage Given to Date: 9 Gy
Reference Point Session Dosage Given: 1.8 Gy
Session Number: 5

## 2022-06-13 NOTE — Telephone Encounter (Signed)
Mr Canavan is requesting a refill of his Oxycodone be sent to [facility]

## 2022-06-14 ENCOUNTER — Other Ambulatory Visit: Payer: Self-pay

## 2022-06-14 ENCOUNTER — Ambulatory Visit
Admission: RE | Admit: 2022-06-14 | Discharge: 2022-06-14 | Disposition: A | Discharge status: Home / Self Care | Payer: Commercial Managed Care - HMO | Source: Ambulatory Visit | Attending: Radiation Oncology | Admitting: Radiation Oncology

## 2022-06-14 ENCOUNTER — Ambulatory Visit: Payer: Commercial Managed Care - HMO | Admitting: Radiation Oncology

## 2022-06-14 ENCOUNTER — Other Ambulatory Visit: Payer: Self-pay | Admitting: Oncology

## 2022-06-14 ENCOUNTER — Other Ambulatory Visit (HOSPITAL_COMMUNITY): Payer: Self-pay

## 2022-06-14 DIAGNOSIS — C61 Malignant neoplasm of prostate: Secondary | ICD-10-CM

## 2022-06-14 DIAGNOSIS — Z510 Encounter for antineoplastic radiation therapy: Secondary | ICD-10-CM | POA: Diagnosis not present

## 2022-06-14 LAB — RAD ONC ARIA SESSION SUMMARY
Course Elapsed Days: 7
Plan Fractions Treated to Date: 6
Plan Prescribed Dose Per Fraction: 1.8 Gy
Plan Total Fractions Prescribed: 25
Plan Total Prescribed Dose: 45 Gy
Reference Point Dosage Given to Date: 10.8 Gy
Reference Point Session Dosage Given: 1.8 Gy
Session Number: 6

## 2022-06-14 MED ORDER — OXYCODONE HCL 10 MG PO TABS
10.0000 mg | ORAL_TABLET | Freq: Four times a day (QID) | ORAL | 0 refills | Status: DC | PRN
  Filled 2022-06-14: qty 90, 23d supply, fill #0

## 2022-06-15 ENCOUNTER — Ambulatory Visit
Admission: RE | Admit: 2022-06-15 | Discharge: 2022-06-15 | Disposition: A | Discharge status: Home / Self Care | Payer: Commercial Managed Care - HMO | Source: Ambulatory Visit | Attending: Radiation Oncology | Admitting: Radiation Oncology

## 2022-06-15 ENCOUNTER — Other Ambulatory Visit: Payer: Self-pay | Admitting: Oncology

## 2022-06-15 ENCOUNTER — Other Ambulatory Visit: Payer: Self-pay

## 2022-06-15 ENCOUNTER — Other Ambulatory Visit (HOSPITAL_COMMUNITY): Payer: Self-pay

## 2022-06-15 DIAGNOSIS — Z510 Encounter for antineoplastic radiation therapy: Secondary | ICD-10-CM | POA: Diagnosis not present

## 2022-06-15 DIAGNOSIS — C61 Malignant neoplasm of prostate: Secondary | ICD-10-CM

## 2022-06-15 LAB — RAD ONC ARIA SESSION SUMMARY
Course Elapsed Days: 8
Plan Fractions Treated to Date: 3
Plan Prescribed Dose Per Fraction: 8 Gy
Plan Total Fractions Prescribed: 5
Plan Total Prescribed Dose: 40 Gy
Reference Point Dosage Given to Date: 24 Gy
Reference Point Session Dosage Given: 8 Gy
Session Number: 7

## 2022-06-16 ENCOUNTER — Ambulatory Visit
Admission: RE | Admit: 2022-06-16 | Discharge: 2022-06-16 | Disposition: A | Discharge status: Home / Self Care | Payer: Commercial Managed Care - HMO | Source: Ambulatory Visit | Attending: Radiation Oncology | Admitting: Radiation Oncology

## 2022-06-16 ENCOUNTER — Encounter: Payer: Self-pay | Admitting: Oncology

## 2022-06-16 ENCOUNTER — Other Ambulatory Visit: Payer: Self-pay

## 2022-06-16 ENCOUNTER — Other Ambulatory Visit (HOSPITAL_COMMUNITY): Payer: Self-pay

## 2022-06-16 DIAGNOSIS — Z510 Encounter for antineoplastic radiation therapy: Secondary | ICD-10-CM | POA: Diagnosis not present

## 2022-06-16 LAB — RAD ONC ARIA SESSION SUMMARY
Course Elapsed Days: 9
Plan Fractions Treated to Date: 8
Plan Prescribed Dose Per Fraction: 1.8 Gy
Plan Total Fractions Prescribed: 25
Plan Total Prescribed Dose: 45 Gy
Reference Point Dosage Given to Date: 14.4 Gy
Reference Point Session Dosage Given: 1.8 Gy
Session Number: 8

## 2022-06-16 MED ORDER — OXYCODONE HCL 10 MG PO TABS
10.0000 mg | ORAL_TABLET | Freq: Four times a day (QID) | ORAL | 0 refills | Status: DC | PRN
  Filled 2022-06-16: qty 90, 23d supply, fill #0

## 2022-06-17 ENCOUNTER — Ambulatory Visit
Admission: RE | Admit: 2022-06-17 | Discharge: 2022-06-17 | Disposition: A | Discharge status: Home / Self Care | Payer: Commercial Managed Care - HMO | Source: Ambulatory Visit | Attending: Radiation Oncology | Admitting: Radiation Oncology

## 2022-06-17 ENCOUNTER — Other Ambulatory Visit: Payer: Self-pay

## 2022-06-17 DIAGNOSIS — C7951 Secondary malignant neoplasm of bone: Secondary | ICD-10-CM

## 2022-06-17 DIAGNOSIS — Z510 Encounter for antineoplastic radiation therapy: Secondary | ICD-10-CM | POA: Diagnosis not present

## 2022-06-17 LAB — RAD ONC ARIA SESSION SUMMARY
Course Elapsed Days: 10
Course Elapsed Days: 10
Plan Fractions Treated to Date: 4
Plan Fractions Treated to Date: 9
Plan Prescribed Dose Per Fraction: 1.8 Gy
Plan Prescribed Dose Per Fraction: 8 Gy
Plan Total Fractions Prescribed: 25
Plan Total Fractions Prescribed: 5
Plan Total Prescribed Dose: 40 Gy
Plan Total Prescribed Dose: 45 Gy
Reference Point Dosage Given to Date: 16.2 Gy
Reference Point Dosage Given to Date: 32 Gy
Reference Point Session Dosage Given: 1.8 Gy
Reference Point Session Dosage Given: 8 Gy
Session Number: 10
Session Number: 9

## 2022-06-20 ENCOUNTER — Other Ambulatory Visit: Payer: Self-pay

## 2022-06-20 ENCOUNTER — Ambulatory Visit: Admission: RE | Admit: 2022-06-20 | Payer: Commercial Managed Care - HMO | Source: Ambulatory Visit

## 2022-06-20 DIAGNOSIS — Z510 Encounter for antineoplastic radiation therapy: Secondary | ICD-10-CM | POA: Diagnosis not present

## 2022-06-20 LAB — RAD ONC ARIA SESSION SUMMARY
Course Elapsed Days: 13
Plan Fractions Treated to Date: 10
Plan Prescribed Dose Per Fraction: 1.8 Gy
Plan Total Fractions Prescribed: 25
Plan Total Prescribed Dose: 45 Gy
Reference Point Dosage Given to Date: 18 Gy
Reference Point Session Dosage Given: 1.8 Gy
Session Number: 11

## 2022-06-21 ENCOUNTER — Ambulatory Visit
Admission: RE | Admit: 2022-06-21 | Discharge: 2022-06-21 | Disposition: A | Discharge status: Home / Self Care | Payer: Commercial Managed Care - HMO | Source: Ambulatory Visit | Attending: Radiation Oncology | Admitting: Radiation Oncology

## 2022-06-21 ENCOUNTER — Other Ambulatory Visit: Payer: Self-pay

## 2022-06-21 DIAGNOSIS — Z510 Encounter for antineoplastic radiation therapy: Secondary | ICD-10-CM | POA: Diagnosis not present

## 2022-06-21 DIAGNOSIS — C61 Malignant neoplasm of prostate: Secondary | ICD-10-CM

## 2022-06-21 LAB — RAD ONC ARIA SESSION SUMMARY
Course Elapsed Days: 14
Plan Fractions Treated to Date: 11
Plan Prescribed Dose Per Fraction: 1.8 Gy
Plan Total Fractions Prescribed: 25
Plan Total Prescribed Dose: 45 Gy
Reference Point Dosage Given to Date: 19.8 Gy
Reference Point Session Dosage Given: 1.8 Gy
Session Number: 12

## 2022-06-22 ENCOUNTER — Other Ambulatory Visit: Payer: Self-pay

## 2022-06-22 ENCOUNTER — Ambulatory Visit
Admission: RE | Admit: 2022-06-22 | Discharge: 2022-06-22 | Disposition: A | Discharge status: Home / Self Care | Payer: Commercial Managed Care - HMO | Source: Ambulatory Visit | Attending: Radiation Oncology | Admitting: Radiation Oncology

## 2022-06-22 DIAGNOSIS — Z510 Encounter for antineoplastic radiation therapy: Secondary | ICD-10-CM | POA: Diagnosis not present

## 2022-06-22 LAB — RAD ONC ARIA SESSION SUMMARY
Course Elapsed Days: 15
Plan Fractions Treated to Date: 12
Plan Prescribed Dose Per Fraction: 1.8 Gy
Plan Total Fractions Prescribed: 25
Plan Total Prescribed Dose: 45 Gy
Reference Point Dosage Given to Date: 21.6 Gy
Reference Point Session Dosage Given: 1.8 Gy
Session Number: 13

## 2022-06-23 ENCOUNTER — Ambulatory Visit
Admission: RE | Admit: 2022-06-23 | Discharge: 2022-06-23 | Disposition: A | Discharge status: Home / Self Care | Payer: Commercial Managed Care - HMO | Source: Ambulatory Visit | Attending: Radiation Oncology | Admitting: Radiation Oncology

## 2022-06-23 ENCOUNTER — Other Ambulatory Visit: Payer: Self-pay

## 2022-06-23 DIAGNOSIS — Z510 Encounter for antineoplastic radiation therapy: Secondary | ICD-10-CM | POA: Diagnosis not present

## 2022-06-23 LAB — RAD ONC ARIA SESSION SUMMARY
Course Elapsed Days: 16
Plan Fractions Treated to Date: 13
Plan Prescribed Dose Per Fraction: 1.8 Gy
Plan Total Fractions Prescribed: 25
Plan Total Prescribed Dose: 45 Gy
Reference Point Dosage Given to Date: 23.4 Gy
Reference Point Session Dosage Given: 1.8 Gy
Session Number: 14

## 2022-06-24 ENCOUNTER — Ambulatory Visit
Admission: RE | Admit: 2022-06-24 | Discharge: 2022-06-24 | Disposition: A | Discharge status: Home / Self Care | Payer: Commercial Managed Care - HMO | Source: Ambulatory Visit | Attending: Radiation Oncology | Admitting: Radiation Oncology

## 2022-06-24 ENCOUNTER — Other Ambulatory Visit: Payer: Self-pay

## 2022-06-24 DIAGNOSIS — Z510 Encounter for antineoplastic radiation therapy: Secondary | ICD-10-CM | POA: Diagnosis not present

## 2022-06-24 LAB — RAD ONC ARIA SESSION SUMMARY
Course Elapsed Days: 17
Plan Fractions Treated to Date: 14
Plan Prescribed Dose Per Fraction: 1.8 Gy
Plan Total Fractions Prescribed: 25
Plan Total Prescribed Dose: 45 Gy
Reference Point Dosage Given to Date: 25.2 Gy
Reference Point Session Dosage Given: 1.8 Gy
Session Number: 15

## 2022-06-27 ENCOUNTER — Ambulatory Visit
Admission: RE | Admit: 2022-06-27 | Discharge: 2022-06-27 | Disposition: A | Discharge status: Home / Self Care | Payer: Commercial Managed Care - HMO | Source: Ambulatory Visit | Attending: Radiation Oncology | Admitting: Radiation Oncology

## 2022-06-27 ENCOUNTER — Other Ambulatory Visit: Payer: Self-pay

## 2022-06-27 DIAGNOSIS — Z510 Encounter for antineoplastic radiation therapy: Secondary | ICD-10-CM | POA: Diagnosis not present

## 2022-06-27 LAB — RAD ONC ARIA SESSION SUMMARY
Course Elapsed Days: 20
Plan Fractions Treated to Date: 15
Plan Prescribed Dose Per Fraction: 1.8 Gy
Plan Total Fractions Prescribed: 25
Plan Total Prescribed Dose: 45 Gy
Reference Point Dosage Given to Date: 27 Gy
Reference Point Session Dosage Given: 1.8 Gy
Session Number: 16

## 2022-06-28 ENCOUNTER — Other Ambulatory Visit: Payer: Self-pay

## 2022-06-28 ENCOUNTER — Other Ambulatory Visit: Payer: Self-pay | Admitting: Oncology

## 2022-06-28 ENCOUNTER — Other Ambulatory Visit (HOSPITAL_COMMUNITY): Payer: Self-pay

## 2022-06-28 ENCOUNTER — Ambulatory Visit
Admission: RE | Admit: 2022-06-28 | Discharge: 2022-06-28 | Disposition: A | Discharge status: Home / Self Care | Payer: Commercial Managed Care - HMO | Source: Ambulatory Visit | Attending: Radiation Oncology | Admitting: Radiation Oncology

## 2022-06-28 DIAGNOSIS — Z510 Encounter for antineoplastic radiation therapy: Secondary | ICD-10-CM | POA: Diagnosis not present

## 2022-06-28 LAB — RAD ONC ARIA SESSION SUMMARY
Course Elapsed Days: 21
Plan Fractions Treated to Date: 16
Plan Prescribed Dose Per Fraction: 1.8 Gy
Plan Total Fractions Prescribed: 25
Plan Total Prescribed Dose: 45 Gy
Reference Point Dosage Given to Date: 28.8 Gy
Reference Point Session Dosage Given: 1.8 Gy
Session Number: 17

## 2022-06-28 MED ORDER — ENZALUTAMIDE 40 MG PO TABS
160.0000 mg | ORAL_TABLET | Freq: Every day | ORAL | 0 refills | Status: DC
  Filled 2022-06-28: qty 120, 30d supply, fill #0

## 2022-06-28 NOTE — Telephone Encounter (Signed)
Refilled per MD note from most recent MD OV: currently on Xtandi which I recommended continuing for at least 2 years possibly longer. He is agreeable to continue.

## 2022-06-29 ENCOUNTER — Other Ambulatory Visit: Payer: Self-pay

## 2022-06-29 ENCOUNTER — Ambulatory Visit
Admission: RE | Admit: 2022-06-29 | Discharge: 2022-06-29 | Disposition: A | Discharge status: Home / Self Care | Payer: Commercial Managed Care - HMO | Source: Ambulatory Visit | Attending: Radiation Oncology | Admitting: Radiation Oncology

## 2022-06-29 ENCOUNTER — Other Ambulatory Visit (HOSPITAL_COMMUNITY): Payer: Self-pay

## 2022-06-29 DIAGNOSIS — Z510 Encounter for antineoplastic radiation therapy: Secondary | ICD-10-CM | POA: Insufficient documentation

## 2022-06-29 DIAGNOSIS — R5383 Other fatigue: Secondary | ICD-10-CM | POA: Diagnosis not present

## 2022-06-29 DIAGNOSIS — C7951 Secondary malignant neoplasm of bone: Secondary | ICD-10-CM | POA: Insufficient documentation

## 2022-06-29 DIAGNOSIS — C61 Malignant neoplasm of prostate: Secondary | ICD-10-CM | POA: Insufficient documentation

## 2022-06-29 LAB — RAD ONC ARIA SESSION SUMMARY
Course Elapsed Days: 22
Plan Fractions Treated to Date: 17
Plan Prescribed Dose Per Fraction: 1.8 Gy
Plan Total Fractions Prescribed: 25
Plan Total Prescribed Dose: 45 Gy
Reference Point Dosage Given to Date: 30.6 Gy
Reference Point Session Dosage Given: 1.8 Gy
Session Number: 18

## 2022-06-30 ENCOUNTER — Other Ambulatory Visit (HOSPITAL_COMMUNITY): Payer: Self-pay

## 2022-06-30 ENCOUNTER — Ambulatory Visit
Admission: RE | Admit: 2022-06-30 | Discharge: 2022-06-30 | Disposition: A | Discharge status: Home / Self Care | Payer: Commercial Managed Care - HMO | Source: Ambulatory Visit | Attending: Radiation Oncology | Admitting: Radiation Oncology

## 2022-06-30 ENCOUNTER — Other Ambulatory Visit: Payer: Self-pay

## 2022-06-30 DIAGNOSIS — Z510 Encounter for antineoplastic radiation therapy: Secondary | ICD-10-CM | POA: Diagnosis not present

## 2022-06-30 LAB — RAD ONC ARIA SESSION SUMMARY
Course Elapsed Days: 23
Plan Fractions Treated to Date: 18
Plan Prescribed Dose Per Fraction: 1.8 Gy
Plan Total Fractions Prescribed: 25
Plan Total Prescribed Dose: 45 Gy
Reference Point Dosage Given to Date: 32.4 Gy
Reference Point Session Dosage Given: 1.8 Gy
Session Number: 19

## 2022-07-01 ENCOUNTER — Other Ambulatory Visit (HOSPITAL_COMMUNITY): Payer: Self-pay

## 2022-07-01 ENCOUNTER — Ambulatory Visit: Payer: Commercial Managed Care - HMO

## 2022-07-04 ENCOUNTER — Other Ambulatory Visit: Payer: Self-pay

## 2022-07-04 ENCOUNTER — Ambulatory Visit: Payer: Commercial Managed Care - HMO

## 2022-07-04 ENCOUNTER — Other Ambulatory Visit (HOSPITAL_COMMUNITY): Payer: Self-pay

## 2022-07-04 ENCOUNTER — Ambulatory Visit
Admission: RE | Admit: 2022-07-04 | Discharge: 2022-07-04 | Disposition: A | Discharge status: Home / Self Care | Payer: Commercial Managed Care - HMO | Source: Ambulatory Visit | Attending: Radiation Oncology | Admitting: Radiation Oncology

## 2022-07-04 DIAGNOSIS — Z510 Encounter for antineoplastic radiation therapy: Secondary | ICD-10-CM | POA: Diagnosis not present

## 2022-07-04 LAB — RAD ONC ARIA SESSION SUMMARY
Course Elapsed Days: 27
Plan Fractions Treated to Date: 19
Plan Prescribed Dose Per Fraction: 1.8 Gy
Plan Total Fractions Prescribed: 25
Plan Total Prescribed Dose: 45 Gy
Reference Point Dosage Given to Date: 34.2 Gy
Reference Point Session Dosage Given: 1.8 Gy
Session Number: 20

## 2022-07-05 ENCOUNTER — Other Ambulatory Visit: Payer: Self-pay

## 2022-07-05 ENCOUNTER — Ambulatory Visit
Admission: RE | Admit: 2022-07-05 | Discharge: 2022-07-05 | Disposition: A | Discharge status: Home / Self Care | Payer: Commercial Managed Care - HMO | Source: Ambulatory Visit | Attending: Radiation Oncology | Admitting: Radiation Oncology

## 2022-07-05 DIAGNOSIS — Z510 Encounter for antineoplastic radiation therapy: Secondary | ICD-10-CM | POA: Diagnosis not present

## 2022-07-05 LAB — RAD ONC ARIA SESSION SUMMARY
Course Elapsed Days: 28
Plan Fractions Treated to Date: 20
Plan Prescribed Dose Per Fraction: 1.8 Gy
Plan Total Fractions Prescribed: 25
Plan Total Prescribed Dose: 45 Gy
Reference Point Dosage Given to Date: 36 Gy
Reference Point Session Dosage Given: 1.8 Gy
Session Number: 21

## 2022-07-06 ENCOUNTER — Other Ambulatory Visit: Payer: Self-pay

## 2022-07-06 ENCOUNTER — Inpatient Hospital Stay (HOSPITAL_BASED_OUTPATIENT_CLINIC_OR_DEPARTMENT_OTHER): Payer: Commercial Managed Care - HMO | Admitting: Oncology

## 2022-07-06 ENCOUNTER — Encounter: Payer: Self-pay | Admitting: Oncology

## 2022-07-06 ENCOUNTER — Ambulatory Visit
Admission: RE | Admit: 2022-07-06 | Discharge: 2022-07-06 | Disposition: A | Discharge status: Home / Self Care | Payer: Commercial Managed Care - HMO | Source: Ambulatory Visit | Attending: Radiation Oncology | Admitting: Radiation Oncology

## 2022-07-06 ENCOUNTER — Inpatient Hospital Stay: Payer: Commercial Managed Care - HMO | Attending: Physician Assistant

## 2022-07-06 VITALS — BP 135/87 | HR 94 | Temp 97.7°F | Resp 18 | Wt 184.0 lb

## 2022-07-06 DIAGNOSIS — C7951 Secondary malignant neoplasm of bone: Secondary | ICD-10-CM | POA: Diagnosis not present

## 2022-07-06 DIAGNOSIS — R5383 Other fatigue: Secondary | ICD-10-CM | POA: Insufficient documentation

## 2022-07-06 DIAGNOSIS — Z510 Encounter for antineoplastic radiation therapy: Secondary | ICD-10-CM | POA: Diagnosis not present

## 2022-07-06 DIAGNOSIS — C61 Malignant neoplasm of prostate: Secondary | ICD-10-CM | POA: Diagnosis not present

## 2022-07-06 LAB — CMP (CANCER CENTER ONLY)
ALT: 22 U/L (ref 0–44)
AST: 25 U/L (ref 15–41)
Albumin: 4 g/dL (ref 3.5–5.0)
Alkaline Phosphatase: 56 U/L (ref 38–126)
Anion gap: 11 (ref 5–15)
BUN: 10 mg/dL (ref 8–23)
CO2: 28 mmol/L (ref 22–32)
Calcium: 9.4 mg/dL (ref 8.9–10.3)
Chloride: 105 mmol/L (ref 98–111)
Creatinine: 1.02 mg/dL (ref 0.61–1.24)
GFR, Estimated: >60 mL/min (ref >60)
Glucose, Bld: 139 mg/dL — ABNORMAL HIGH (ref 70–99)
Potassium: 4.1 mmol/L (ref 3.5–5.1)
Sodium: 144 mmol/L (ref 135–145)
Total Bilirubin: 0.8 mg/dL (ref 0.3–1.2)
Total Protein: 7.4 g/dL (ref 6.5–8.1)

## 2022-07-06 LAB — CBC WITH DIFFERENTIAL (CANCER CENTER ONLY)
Abs Immature Granulocytes: 0.01 10*3/uL (ref 0.00–0.07)
Basophils Absolute: 0 10*3/uL (ref 0.0–0.1)
Basophils Relative: 1 %
Eosinophils Absolute: 0.3 10*3/uL (ref 0.0–0.5)
Eosinophils Relative: 14 %
HCT: 37.1 % — ABNORMAL LOW (ref 39.0–52.0)
Hemoglobin: 12.8 g/dL — ABNORMAL LOW (ref 13.0–17.0)
Immature Granulocytes: 0 %
Lymphocytes Relative: 10 %
Lymphs Abs: 0.2 10*3/uL — ABNORMAL LOW (ref 0.7–4.0)
MCH: 31.7 pg (ref 26.0–34.0)
MCHC: 34.5 g/dL (ref 30.0–36.0)
MCV: 91.8 fL (ref 80.0–100.0)
Monocytes Absolute: 0.5 10*3/uL (ref 0.1–1.0)
Monocytes Relative: 20 %
Neutro Abs: 1.3 10*3/uL — ABNORMAL LOW (ref 1.7–7.7)
Neutrophils Relative %: 55 %
Platelet Count: 108 10*3/uL — ABNORMAL LOW (ref 150–400)
RBC: 4.04 MIL/uL — ABNORMAL LOW (ref 4.22–5.81)
RDW: 13.5 % (ref 11.5–15.5)
WBC Count: 2.3 10*3/uL — ABNORMAL LOW (ref 4.0–10.5)
nRBC: 0 % (ref 0.0–0.2)

## 2022-07-06 LAB — RAD ONC ARIA SESSION SUMMARY
Course Elapsed Days: 29
Plan Fractions Treated to Date: 21
Plan Prescribed Dose Per Fraction: 1.8 Gy
Plan Total Fractions Prescribed: 25
Plan Total Prescribed Dose: 45 Gy
Reference Point Dosage Given to Date: 37.8 Gy
Reference Point Session Dosage Given: 1.8 Gy
Session Number: 22

## 2022-07-06 NOTE — Progress Notes (Signed)
Hematology and Oncology Follow Up Visit  Dylan [name] [id number] Jul 08, 1961 61 y.o. 07/06/2022 2:37 PM Dylan [name], MDShadad, [name], MD   Principle Diagnosis: 61 year old man with advanced prostate cancer with disease to the bone diagnosed in July 2023.  He was found to have castration-sensitive, PSA of 26 and bone involvement including fourth rib, L4 indicating oligometastatic disease.   Prior Therapy:  He is status post L4 bone biopsy completed on March 04, 2022 which confirmed the presence of prostate cancer.  Firmagon 240 mg started on March 09, 2022.    Radiation to the left fourth rib as well as L4, prostate and pelvic lymph nodes currently ongoing  Current therapy:  Eligard 30 mg every 4 months currently receiving it at home via home care agency.  Xtandi 160 mg daily started on March 18, 2022  Interim History: Dylan [name] is here for a follow-up visit.  Since the last visit, he reports no major changes in his health.  He continues to tolerate Xtandi without any complications.  He denies any nausea, vomiting or abdominal pain.  He denies any hospitalizations or illnesses.  He is currently undergoing radiation treatment for the prostate as well as the isolated metastatic lesions.  He does report some fatigue and tiredness related to it.     Medications: Updated on review. Current Outpatient Medications  Medication Sig Dispense Refill   albuterol (PROVENTIL) (2.5 MG/3ML) 0.083% nebulizer solution Take 3 mLs (2.5 mg total) by nebulization every 6 (six) hours as needed for wheezing or shortness of breath. 75 mL 12   benzonatate (TESSALON) 100 MG capsule Take 1 capsule (100 mg total) by mouth 3 (three) times daily as needed for cough. 30 capsule 0   Budeson-Glycopyrrol-Formoterol (BREZTRI AEROSPHERE) 160-9-4.8 MCG/ACT AERO Inhale 2 puffs into the lungs in the morning and at bedtime. (Patient taking differently: Inhale 2 puffs into the lungs in the morning and at bedtime.) 10.7 g 6    Chlorphen-Phenyleph-ASA (ALKA-SELTZER PLUS COLD) 2-7.8-325 MG TBEF Take 2 tablets by mouth 2 (two) times daily as needed (cold symptoms).     cyclobenzaprine (FLEXERIL) 10 MG tablet Take 1 tablet (10 mg total) by mouth 2 (two) times daily between meals as needed for muscle spasms. 30 tablet 0   enzalutamide (XTANDI) 40 MG tablet Take 4 tablets (160 mg total) by mouth daily. 120 tablet 0   guaiFENesin (MUCINEX) 600 MG 12 hr tablet Take 1 tablet (600 mg total) by mouth 2 (two) times daily. (Patient taking differently: Take 600 mg by mouth 2 (two) times daily as needed.) 20 tablet 0   ibuprofen (ADVIL) 200 MG tablet Take 200-400 mg by mouth every 6 (six) hours as needed.     meloxicam (MOBIC) 15 MG tablet Take 1 tablet (15 mg total) by mouth daily. 30 tablet 0   Oxycodone HCl 10 MG TABS Take 1 tablet (10 mg total) by mouth every 6 (six) hours as needed. 90 tablet 0   tamsulosin (FLOMAX) 0.4 MG CAPS capsule Take 1 capsule by mouth at bedtime (Patient taking differently: Take 0.4 mg by mouth at bedtime.) 90 capsule 3   No current facility-administered medications for this visit.     Allergies:  Allergies  Allergen Reactions   Other Nausea And Vomiting    Narcotics- pt states he does not like to take narcotics d/t makes him sick      Physical Exam:  Blood pressure 135/87, pulse 94, temperature 97.7 F (36.5 C), temperature source Temporal, resp.  rate 18, weight 184 lb (83.5 kg), SpO2 97 %.  ECOG:     General appearance: Alert, awake without any distress. Head: Atraumatic without abnormalities Oropharynx: Without any thrush or ulcers. Eyes: No scleral icterus. Lymph nodes: No lymphadenopathy noted in the cervical, supraclavicular, or axillary nodes Heart:regular rate and rhythm, without any murmurs or gallops.   Lung: Clear to auscultation without any rhonchi, wheezes or dullness to percussion. Abdomin: Soft, nontender without any shifting dullness or ascites. Musculoskeletal: No  clubbing or cyanosis. Neurological: No motor or sensory deficits. Skin: No rashes or lesions. t.      Lab Results: Lab Results  Component Value Date   WBC 3.9 (L) 05/05/2022   HGB 14.1 05/05/2022   HCT 41.7 05/05/2022   MCV 89.7 05/05/2022   PLT 160 05/05/2022     Chemistry      Component Value Date/Time   NA 142 05/05/2022 1232   K 4.3 05/05/2022 1232   CL 107 05/05/2022 1232   CO2 30 05/05/2022 1232   BUN 18 05/05/2022 1232   CREATININE 1.32 (H) 05/05/2022 1232   CREATININE 1.18 11/12/2014 1540      Component Value Date/Time   CALCIUM 10.0 05/05/2022 1232   ALKPHOS 81 05/05/2022 1232   AST 26 05/05/2022 1232   ALT 26 05/05/2022 1232   BILITOT 0.9 05/05/2022 1232       Latest Reference Range & Units 02/11/22 16:25 05/05/22 12:32  Prostate Specific Ag, Serum 0.0 - 4.0 ng/mL 26.1 (H) <0.1  (H): Data is abnormally high  Impression and Plan:   61 year old with:  1.  Castration-sensitive advanced prostate cancer diagnosed in July 2023.  He presented with oligometastatic disease involving L4 and fourth rib.   He is currently receiving Xtandi in addition to androgen deprivation with excellent response and PSA undetectable.  Long-term complication associated with Gillermina Phy were reviewed.  These would include hypertension and fatigue as well as rarely seizures.  The duration of therapy is indefinite unless he has excellent response and consideration for therapy de-escalation could occur in 2 years His PSA continues to be undetectable and he has a complete response.    2.  Local therapy: He is currently receiving radiation to the prostate, pelvic lymph nodes as well as oligometastatic disease.   3.  Bone directed therapy: I recommended calcium and vitamin D supplements and obtaining dental clearance for possible Xgeva in the future.      4 .  Androgen deprivation: He is currently on Eligard which should receive every 4 months for a total of 30 mg.  He is currently  receiving it at home due to insurance purposes.   5. Follow-up: Will be in the next 3 to 4 months for repeat evaluation.  30  minutes were spent on this visit.  The time was dedicated to reviewing laboratory data, disease status update, treatment choices and outlining future plan of care review.     Zola Button, MD 11/8/20232:37 PM

## 2022-07-07 ENCOUNTER — Ambulatory Visit
Admission: RE | Admit: 2022-07-07 | Discharge: 2022-07-07 | Disposition: A | Discharge status: Home / Self Care | Payer: Commercial Managed Care - HMO | Source: Ambulatory Visit | Attending: Radiation Oncology | Admitting: Radiation Oncology

## 2022-07-07 ENCOUNTER — Telehealth: Payer: Self-pay | Admitting: *Deleted

## 2022-07-07 ENCOUNTER — Other Ambulatory Visit: Payer: Self-pay

## 2022-07-07 DIAGNOSIS — Z510 Encounter for antineoplastic radiation therapy: Secondary | ICD-10-CM | POA: Diagnosis not present

## 2022-07-07 LAB — RAD ONC ARIA SESSION SUMMARY
Course Elapsed Days: 30
Plan Fractions Treated to Date: 22
Plan Prescribed Dose Per Fraction: 1.8 Gy
Plan Total Fractions Prescribed: 25
Plan Total Prescribed Dose: 45 Gy
Reference Point Dosage Given to Date: 39.6 Gy
Reference Point Session Dosage Given: 1.8 Gy
Session Number: 23

## 2022-07-07 LAB — PROSTATE-SPECIFIC AG, SERUM (LABCORP): Prostate Specific Ag, Serum: <0.1 ng/mL (ref 0.0–4.0)

## 2022-07-07 NOTE — Telephone Encounter (Signed)
PC to patient, no answer, left VM - informed patient his PSA is <0.1   Instructed patient to call with any questions/concerns, [phone].

## 2022-07-07 NOTE — Telephone Encounter (Signed)
-----   Message from [name], MD sent at 07/07/2022 10:06 AM EST ----- Please let him know his PSA is still down

## 2022-07-08 ENCOUNTER — Other Ambulatory Visit (HOSPITAL_COMMUNITY): Payer: Self-pay

## 2022-07-08 ENCOUNTER — Ambulatory Visit: Payer: Commercial Managed Care - HMO

## 2022-07-08 ENCOUNTER — Other Ambulatory Visit: Payer: Self-pay | Admitting: Oncology

## 2022-07-08 ENCOUNTER — Encounter: Payer: Self-pay | Admitting: Oncology

## 2022-07-08 DIAGNOSIS — C61 Malignant neoplasm of prostate: Secondary | ICD-10-CM

## 2022-07-08 MED ORDER — OXYCODONE HCL 10 MG PO TABS
10.0000 mg | ORAL_TABLET | Freq: Four times a day (QID) | ORAL | 0 refills | Status: DC | PRN
  Filled 2022-07-08: qty 90, 23d supply, fill #0

## 2022-07-11 ENCOUNTER — Ambulatory Visit: Payer: Commercial Managed Care - HMO

## 2022-07-11 ENCOUNTER — Ambulatory Visit
Admission: RE | Admit: 2022-07-11 | Discharge: 2022-07-11 | Disposition: A | Discharge status: Home / Self Care | Payer: Commercial Managed Care - HMO | Source: Ambulatory Visit | Attending: Radiation Oncology | Admitting: Radiation Oncology

## 2022-07-11 ENCOUNTER — Other Ambulatory Visit: Payer: Self-pay

## 2022-07-11 DIAGNOSIS — Z510 Encounter for antineoplastic radiation therapy: Secondary | ICD-10-CM | POA: Diagnosis not present

## 2022-07-11 LAB — RAD ONC ARIA SESSION SUMMARY
Course Elapsed Days: 34
Plan Fractions Treated to Date: 23
Plan Prescribed Dose Per Fraction: 1.8 Gy
Plan Total Fractions Prescribed: 25
Plan Total Prescribed Dose: 45 Gy
Reference Point Dosage Given to Date: 41.4 Gy
Reference Point Session Dosage Given: 1.8 Gy
Session Number: 24

## 2022-07-12 ENCOUNTER — Ambulatory Visit
Admission: RE | Admit: 2022-07-12 | Discharge: 2022-07-12 | Disposition: A | Discharge status: Home / Self Care | Payer: Commercial Managed Care - HMO | Source: Ambulatory Visit | Attending: Radiation Oncology | Admitting: Radiation Oncology

## 2022-07-12 ENCOUNTER — Ambulatory Visit: Payer: Commercial Managed Care - HMO

## 2022-07-12 ENCOUNTER — Other Ambulatory Visit: Payer: Self-pay

## 2022-07-12 DIAGNOSIS — Z510 Encounter for antineoplastic radiation therapy: Secondary | ICD-10-CM | POA: Diagnosis not present

## 2022-07-12 LAB — RAD ONC ARIA SESSION SUMMARY
Course Elapsed Days: 35
Plan Fractions Treated to Date: 24
Plan Prescribed Dose Per Fraction: 1.8 Gy
Plan Total Fractions Prescribed: 25
Plan Total Prescribed Dose: 45 Gy
Reference Point Dosage Given to Date: 43.2 Gy
Reference Point Session Dosage Given: 1.8 Gy
Session Number: 25

## 2022-07-13 ENCOUNTER — Other Ambulatory Visit: Payer: Self-pay

## 2022-07-13 ENCOUNTER — Ambulatory Visit
Admission: RE | Admit: 2022-07-13 | Discharge: 2022-07-13 | Disposition: A | Discharge status: Home / Self Care | Payer: Commercial Managed Care - HMO | Source: Ambulatory Visit | Attending: Radiation Oncology | Admitting: Radiation Oncology

## 2022-07-13 ENCOUNTER — Ambulatory Visit: Payer: Commercial Managed Care - HMO

## 2022-07-13 DIAGNOSIS — Z510 Encounter for antineoplastic radiation therapy: Secondary | ICD-10-CM | POA: Diagnosis not present

## 2022-07-13 LAB — RAD ONC ARIA SESSION SUMMARY
Course Elapsed Days: 36
Plan Fractions Treated to Date: 25
Plan Prescribed Dose Per Fraction: 1.8 Gy
Plan Total Fractions Prescribed: 25
Plan Total Prescribed Dose: 45 Gy
Reference Point Dosage Given to Date: 45 Gy
Reference Point Session Dosage Given: 1.8 Gy
Session Number: 26

## 2022-07-14 ENCOUNTER — Ambulatory Visit: Payer: Commercial Managed Care - HMO

## 2022-07-14 ENCOUNTER — Other Ambulatory Visit: Payer: Self-pay

## 2022-07-14 DIAGNOSIS — Z510 Encounter for antineoplastic radiation therapy: Secondary | ICD-10-CM | POA: Diagnosis not present

## 2022-07-14 LAB — RAD ONC ARIA SESSION SUMMARY
Course Elapsed Days: 37
Plan Fractions Treated to Date: 1
Plan Prescribed Dose Per Fraction: 2 Gy
Plan Total Fractions Prescribed: 15
Plan Total Prescribed Dose: 30 Gy
Reference Point Dosage Given to Date: 2 Gy
Reference Point Session Dosage Given: 2 Gy
Session Number: 27

## 2022-07-15 ENCOUNTER — Other Ambulatory Visit: Payer: Self-pay

## 2022-07-15 ENCOUNTER — Ambulatory Visit
Admission: RE | Admit: 2022-07-15 | Discharge: 2022-07-15 | Disposition: A | Discharge status: Home / Self Care | Payer: Commercial Managed Care - HMO | Source: Ambulatory Visit | Attending: Radiation Oncology | Admitting: Radiation Oncology

## 2022-07-15 DIAGNOSIS — Z510 Encounter for antineoplastic radiation therapy: Secondary | ICD-10-CM | POA: Diagnosis not present

## 2022-07-15 LAB — RAD ONC ARIA SESSION SUMMARY
Course Elapsed Days: 38
Plan Fractions Treated to Date: 2
Plan Prescribed Dose Per Fraction: 2 Gy
Plan Total Fractions Prescribed: 15
Plan Total Prescribed Dose: 30 Gy
Reference Point Dosage Given to Date: 4 Gy
Reference Point Session Dosage Given: 2 Gy
Session Number: 28

## 2022-07-18 ENCOUNTER — Other Ambulatory Visit: Payer: Self-pay

## 2022-07-18 ENCOUNTER — Ambulatory Visit
Admission: RE | Admit: 2022-07-18 | Discharge: 2022-07-18 | Disposition: A | Discharge status: Home / Self Care | Payer: Commercial Managed Care - HMO | Source: Ambulatory Visit | Attending: Radiation Oncology | Admitting: Radiation Oncology

## 2022-07-18 DIAGNOSIS — Z510 Encounter for antineoplastic radiation therapy: Secondary | ICD-10-CM | POA: Diagnosis not present

## 2022-07-18 LAB — RAD ONC ARIA SESSION SUMMARY
Course Elapsed Days: 41
Plan Fractions Treated to Date: 3
Plan Prescribed Dose Per Fraction: 2 Gy
Plan Total Fractions Prescribed: 15
Plan Total Prescribed Dose: 30 Gy
Reference Point Dosage Given to Date: 6 Gy
Reference Point Session Dosage Given: 2 Gy
Session Number: 29

## 2022-07-19 ENCOUNTER — Ambulatory Visit
Admission: RE | Admit: 2022-07-19 | Discharge: 2022-07-19 | Disposition: A | Discharge status: Home / Self Care | Payer: Commercial Managed Care - HMO | Source: Ambulatory Visit | Attending: Radiation Oncology | Admitting: Radiation Oncology

## 2022-07-19 ENCOUNTER — Other Ambulatory Visit: Payer: Self-pay

## 2022-07-19 DIAGNOSIS — Z510 Encounter for antineoplastic radiation therapy: Secondary | ICD-10-CM | POA: Diagnosis not present

## 2022-07-19 LAB — RAD ONC ARIA SESSION SUMMARY
Course Elapsed Days: 42
Plan Fractions Treated to Date: 4
Plan Prescribed Dose Per Fraction: 2 Gy
Plan Total Fractions Prescribed: 15
Plan Total Prescribed Dose: 30 Gy
Reference Point Dosage Given to Date: 8 Gy
Reference Point Session Dosage Given: 2 Gy
Session Number: 30

## 2022-07-20 ENCOUNTER — Ambulatory Visit: Payer: Commercial Managed Care - HMO

## 2022-07-20 ENCOUNTER — Ambulatory Visit
Admission: RE | Admit: 2022-07-20 | Discharge: 2022-07-20 | Disposition: A | Discharge status: Home / Self Care | Payer: Commercial Managed Care - HMO | Source: Ambulatory Visit | Attending: Radiation Oncology | Admitting: Radiation Oncology

## 2022-07-20 ENCOUNTER — Other Ambulatory Visit: Payer: Self-pay

## 2022-07-20 DIAGNOSIS — Z510 Encounter for antineoplastic radiation therapy: Secondary | ICD-10-CM | POA: Diagnosis not present

## 2022-07-20 LAB — RAD ONC ARIA SESSION SUMMARY
Course Elapsed Days: 43
Plan Fractions Treated to Date: 5
Plan Prescribed Dose Per Fraction: 2 Gy
Plan Total Fractions Prescribed: 15
Plan Total Prescribed Dose: 30 Gy
Reference Point Dosage Given to Date: 10 Gy
Reference Point Session Dosage Given: 2 Gy
Session Number: 31

## 2022-07-25 ENCOUNTER — Other Ambulatory Visit: Payer: Self-pay

## 2022-07-25 ENCOUNTER — Ambulatory Visit
Admission: RE | Admit: 2022-07-25 | Discharge: 2022-07-25 | Disposition: A | Discharge status: Home / Self Care | Payer: Commercial Managed Care - HMO | Source: Ambulatory Visit | Attending: Radiation Oncology | Admitting: Radiation Oncology

## 2022-07-25 DIAGNOSIS — Z510 Encounter for antineoplastic radiation therapy: Secondary | ICD-10-CM | POA: Diagnosis not present

## 2022-07-25 LAB — RAD ONC ARIA SESSION SUMMARY
Course Elapsed Days: 48
Plan Fractions Treated to Date: 6
Plan Prescribed Dose Per Fraction: 2 Gy
Plan Total Fractions Prescribed: 15
Plan Total Prescribed Dose: 30 Gy
Reference Point Dosage Given to Date: 12 Gy
Reference Point Session Dosage Given: 2 Gy
Session Number: 32

## 2022-07-26 ENCOUNTER — Other Ambulatory Visit: Payer: Self-pay

## 2022-07-26 ENCOUNTER — Ambulatory Visit
Admission: RE | Admit: 2022-07-26 | Discharge: 2022-07-26 | Disposition: A | Discharge status: Home / Self Care | Payer: Commercial Managed Care - HMO | Source: Ambulatory Visit | Attending: Radiation Oncology | Admitting: Radiation Oncology

## 2022-07-26 DIAGNOSIS — Z510 Encounter for antineoplastic radiation therapy: Secondary | ICD-10-CM | POA: Diagnosis not present

## 2022-07-26 LAB — RAD ONC ARIA SESSION SUMMARY
Course Elapsed Days: 49
Plan Fractions Treated to Date: 7
Plan Prescribed Dose Per Fraction: 2 Gy
Plan Total Fractions Prescribed: 15
Plan Total Prescribed Dose: 30 Gy
Reference Point Dosage Given to Date: 14 Gy
Reference Point Session Dosage Given: 2 Gy
Session Number: 33

## 2022-07-27 ENCOUNTER — Other Ambulatory Visit: Payer: Self-pay | Admitting: Oncology

## 2022-07-27 ENCOUNTER — Other Ambulatory Visit: Payer: Self-pay

## 2022-07-27 ENCOUNTER — Other Ambulatory Visit (HOSPITAL_COMMUNITY): Payer: Self-pay

## 2022-07-27 ENCOUNTER — Ambulatory Visit
Admission: RE | Admit: 2022-07-27 | Discharge: 2022-07-27 | Disposition: A | Discharge status: Home / Self Care | Payer: Commercial Managed Care - HMO | Source: Ambulatory Visit | Attending: Radiation Oncology | Admitting: Radiation Oncology

## 2022-07-27 DIAGNOSIS — Z510 Encounter for antineoplastic radiation therapy: Secondary | ICD-10-CM | POA: Diagnosis not present

## 2022-07-27 LAB — RAD ONC ARIA SESSION SUMMARY
Course Elapsed Days: 50
Plan Fractions Treated to Date: 8
Plan Prescribed Dose Per Fraction: 2 Gy
Plan Total Fractions Prescribed: 15
Plan Total Prescribed Dose: 30 Gy
Reference Point Dosage Given to Date: 16 Gy
Reference Point Session Dosage Given: 2 Gy
Session Number: 34

## 2022-07-27 MED ORDER — ENZALUTAMIDE 40 MG PO TABS
160.0000 mg | ORAL_TABLET | Freq: Every day | ORAL | 0 refills | Status: DC
  Filled 2022-07-27 – 2022-08-03 (×2): qty 120, 30d supply, fill #0

## 2022-07-28 ENCOUNTER — Ambulatory Visit
Admission: RE | Admit: 2022-07-28 | Discharge: 2022-07-28 | Disposition: A | Discharge status: Home / Self Care | Payer: Commercial Managed Care - HMO | Source: Ambulatory Visit | Attending: Radiation Oncology | Admitting: Radiation Oncology

## 2022-07-28 ENCOUNTER — Other Ambulatory Visit: Payer: Self-pay

## 2022-07-28 DIAGNOSIS — Z510 Encounter for antineoplastic radiation therapy: Secondary | ICD-10-CM | POA: Diagnosis not present

## 2022-07-28 LAB — RAD ONC ARIA SESSION SUMMARY
Course Elapsed Days: 51
Plan Fractions Treated to Date: 9
Plan Prescribed Dose Per Fraction: 2 Gy
Plan Total Fractions Prescribed: 15
Plan Total Prescribed Dose: 30 Gy
Reference Point Dosage Given to Date: 18 Gy
Reference Point Session Dosage Given: 2 Gy
Session Number: 35

## 2022-07-29 ENCOUNTER — Ambulatory Visit: Payer: Commercial Managed Care - HMO | Admitting: Gastroenterology

## 2022-07-29 ENCOUNTER — Ambulatory Visit: Payer: Commercial Managed Care - HMO

## 2022-07-29 ENCOUNTER — Other Ambulatory Visit (HOSPITAL_COMMUNITY): Payer: Self-pay

## 2022-08-01 ENCOUNTER — Ambulatory Visit
Admission: RE | Admit: 2022-08-01 | Discharge: 2022-08-01 | Disposition: A | Discharge status: Home / Self Care | Payer: Commercial Managed Care - HMO | Source: Ambulatory Visit | Attending: Radiation Oncology | Admitting: Radiation Oncology

## 2022-08-01 ENCOUNTER — Other Ambulatory Visit: Payer: Self-pay | Admitting: Oncology

## 2022-08-01 ENCOUNTER — Other Ambulatory Visit: Payer: Self-pay

## 2022-08-01 DIAGNOSIS — Z510 Encounter for antineoplastic radiation therapy: Secondary | ICD-10-CM | POA: Insufficient documentation

## 2022-08-01 DIAGNOSIS — C61 Malignant neoplasm of prostate: Secondary | ICD-10-CM | POA: Diagnosis present

## 2022-08-01 DIAGNOSIS — C7951 Secondary malignant neoplasm of bone: Secondary | ICD-10-CM | POA: Insufficient documentation

## 2022-08-01 LAB — RAD ONC ARIA SESSION SUMMARY
Course Elapsed Days: 55
Plan Fractions Treated to Date: 10
Plan Prescribed Dose Per Fraction: 2 Gy
Plan Total Fractions Prescribed: 15
Plan Total Prescribed Dose: 30 Gy
Reference Point Dosage Given to Date: 20 Gy
Reference Point Session Dosage Given: 2 Gy
Session Number: 36

## 2022-08-02 ENCOUNTER — Ambulatory Visit
Admission: RE | Admit: 2022-08-02 | Discharge: 2022-08-02 | Disposition: A | Discharge status: Home / Self Care | Payer: Commercial Managed Care - HMO | Source: Ambulatory Visit | Attending: Radiation Oncology | Admitting: Radiation Oncology

## 2022-08-02 ENCOUNTER — Other Ambulatory Visit: Payer: Self-pay

## 2022-08-02 ENCOUNTER — Other Ambulatory Visit (HOSPITAL_COMMUNITY): Payer: Self-pay

## 2022-08-02 DIAGNOSIS — Z510 Encounter for antineoplastic radiation therapy: Secondary | ICD-10-CM | POA: Diagnosis not present

## 2022-08-02 LAB — RAD ONC ARIA SESSION SUMMARY
Course Elapsed Days: 56
Plan Fractions Treated to Date: 11
Plan Prescribed Dose Per Fraction: 2 Gy
Plan Total Fractions Prescribed: 15
Plan Total Prescribed Dose: 30 Gy
Reference Point Dosage Given to Date: 22 Gy
Reference Point Session Dosage Given: 2 Gy
Session Number: 37

## 2022-08-02 MED ORDER — OXYCODONE HCL 10 MG PO TABS
10.0000 mg | ORAL_TABLET | Freq: Four times a day (QID) | ORAL | 0 refills | Status: DC | PRN
  Filled 2022-08-02: qty 90, 23d supply, fill #0

## 2022-08-03 ENCOUNTER — Other Ambulatory Visit (HOSPITAL_COMMUNITY): Payer: Self-pay

## 2022-08-03 ENCOUNTER — Ambulatory Visit
Admission: RE | Admit: 2022-08-03 | Discharge: 2022-08-03 | Disposition: A | Discharge status: Home / Self Care | Payer: Commercial Managed Care - HMO | Source: Ambulatory Visit | Attending: Radiation Oncology | Admitting: Radiation Oncology

## 2022-08-03 ENCOUNTER — Other Ambulatory Visit: Payer: Self-pay

## 2022-08-03 ENCOUNTER — Ambulatory Visit: Payer: Commercial Managed Care - HMO

## 2022-08-03 DIAGNOSIS — Z510 Encounter for antineoplastic radiation therapy: Secondary | ICD-10-CM | POA: Diagnosis not present

## 2022-08-03 LAB — RAD ONC ARIA SESSION SUMMARY
Course Elapsed Days: 57
Plan Fractions Treated to Date: 12
Plan Prescribed Dose Per Fraction: 2 Gy
Plan Total Fractions Prescribed: 15
Plan Total Prescribed Dose: 30 Gy
Reference Point Dosage Given to Date: 24 Gy
Reference Point Session Dosage Given: 2 Gy
Session Number: 38

## 2022-08-04 ENCOUNTER — Other Ambulatory Visit: Payer: Self-pay

## 2022-08-04 ENCOUNTER — Ambulatory Visit: Payer: Commercial Managed Care - HMO

## 2022-08-04 ENCOUNTER — Ambulatory Visit
Admission: RE | Admit: 2022-08-04 | Discharge: 2022-08-04 | Disposition: A | Discharge status: Home / Self Care | Payer: Commercial Managed Care - HMO | Source: Ambulatory Visit | Attending: Radiation Oncology | Admitting: Radiation Oncology

## 2022-08-04 DIAGNOSIS — Z510 Encounter for antineoplastic radiation therapy: Secondary | ICD-10-CM | POA: Diagnosis not present

## 2022-08-04 LAB — RAD ONC ARIA SESSION SUMMARY
Course Elapsed Days: 58
Plan Fractions Treated to Date: 13
Plan Prescribed Dose Per Fraction: 2 Gy
Plan Total Fractions Prescribed: 15
Plan Total Prescribed Dose: 30 Gy
Reference Point Dosage Given to Date: 26 Gy
Reference Point Session Dosage Given: 2 Gy
Session Number: 39

## 2022-08-05 ENCOUNTER — Ambulatory Visit
Admission: RE | Admit: 2022-08-05 | Discharge: 2022-08-05 | Disposition: A | Discharge status: Home / Self Care | Payer: Commercial Managed Care - HMO | Source: Ambulatory Visit | Attending: Radiation Oncology | Admitting: Radiation Oncology

## 2022-08-05 ENCOUNTER — Ambulatory Visit: Payer: Commercial Managed Care - HMO

## 2022-08-05 ENCOUNTER — Other Ambulatory Visit: Payer: Self-pay

## 2022-08-05 DIAGNOSIS — Z510 Encounter for antineoplastic radiation therapy: Secondary | ICD-10-CM | POA: Diagnosis not present

## 2022-08-05 LAB — RAD ONC ARIA SESSION SUMMARY
Course Elapsed Days: 59
Plan Fractions Treated to Date: 14
Plan Prescribed Dose Per Fraction: 2 Gy
Plan Total Fractions Prescribed: 15
Plan Total Prescribed Dose: 30 Gy
Reference Point Dosage Given to Date: 28 Gy
Reference Point Session Dosage Given: 2 Gy
Session Number: 40

## 2022-08-08 ENCOUNTER — Other Ambulatory Visit: Payer: Self-pay

## 2022-08-08 ENCOUNTER — Encounter: Payer: Self-pay | Admitting: Urology

## 2022-08-08 ENCOUNTER — Ambulatory Visit
Admission: RE | Admit: 2022-08-08 | Discharge: 2022-08-08 | Disposition: A | Discharge status: Home / Self Care | Payer: Commercial Managed Care - HMO | Source: Ambulatory Visit | Attending: Radiation Oncology | Admitting: Radiation Oncology

## 2022-08-08 DIAGNOSIS — Z510 Encounter for antineoplastic radiation therapy: Secondary | ICD-10-CM | POA: Diagnosis not present

## 2022-08-08 LAB — RAD ONC ARIA SESSION SUMMARY
Course Elapsed Days: 62
Plan Fractions Treated to Date: 15
Plan Prescribed Dose Per Fraction: 2 Gy
Plan Total Fractions Prescribed: 15
Plan Total Prescribed Dose: 30 Gy
Reference Point Dosage Given to Date: 30 Gy
Reference Point Session Dosage Given: 2 Gy
Session Number: 41

## 2022-08-25 ENCOUNTER — Other Ambulatory Visit: Payer: Self-pay | Admitting: Oncology

## 2022-08-25 ENCOUNTER — Other Ambulatory Visit (HOSPITAL_COMMUNITY): Payer: Self-pay

## 2022-08-25 ENCOUNTER — Other Ambulatory Visit: Payer: Self-pay

## 2022-08-25 DIAGNOSIS — C61 Malignant neoplasm of prostate: Secondary | ICD-10-CM

## 2022-08-25 MED ORDER — OXYCODONE HCL 10 MG PO TABS
10.0000 mg | ORAL_TABLET | Freq: Four times a day (QID) | ORAL | 0 refills | Status: DC | PRN
  Filled 2022-08-25: qty 90, 23d supply, fill #0

## 2022-08-25 MED ORDER — ENZALUTAMIDE 40 MG PO TABS
160.0000 mg | ORAL_TABLET | Freq: Every day | ORAL | 0 refills | Status: DC
  Filled 2022-08-25: qty 120, 30d supply, fill #0

## 2022-09-02 NOTE — Progress Notes (Signed)
                                                                                                                                                             Patient Name: Dylan [name] MRN: [id number] DOB: [birth year] Referring Physician: Zola Button (Profile Not Attached) Date of Service: 08/08/2022 [facility], [place]                                                        End Of Treatment Note  Diagnoses: C61-Malignant neoplasm of prostate  Cancer Staging: 62 y/o male with advanced, oligometastatic prostate cancer involving a left 4th rib and L4 vertebral body.   Intent: Curative  Radiation Treatment Dates: 06/07/2022 through 08/08/2022 Site Technique Total Dose (Gy) Dose per Fx (Gy) Completed Fx Beam Energies  Prostate: Prostate_Pelvis IMRT 45/45 1.8 25/25 6X  Prostate: Prostate_Bst_IMRT IMRT 30/30 2 15/15 6X  Ribs, Left: Chest_L_4thRib IMRT 40/40 8 5/5 6XFFF   Narrative: The patient tolerated radiation therapy relatively well.  He did report some increased nocturia despite taking his Flomax daily as prescribed.  He also reported occasional diarrhea.  Plan: The patient will receive a call in about one month from the radiation oncology department. He will continue follow up with his urologist, Dr. Abner Greenspan, as well.  ------------------------------------------------   Tyler Pita, MD [place]: [phone]  Fax: [phone] [email or web address]  Skype  LinkedIn

## 2022-09-07 ENCOUNTER — Telehealth: Payer: Self-pay | Admitting: *Deleted

## 2022-09-07 NOTE — Telephone Encounter (Signed)
Returned PC to patient - informed him of his upcoming lab & MD appointments with Dr Lorenso Courier.  He verbalizes understanding.

## 2022-09-13 ENCOUNTER — Encounter: Payer: Self-pay | Admitting: Oncology

## 2022-09-16 ENCOUNTER — Other Ambulatory Visit: Payer: Self-pay | Admitting: Oncology

## 2022-09-16 ENCOUNTER — Encounter: Payer: Self-pay | Admitting: Oncology

## 2022-09-16 DIAGNOSIS — C61 Malignant neoplasm of prostate: Secondary | ICD-10-CM

## 2022-09-18 MED ORDER — OXYCODONE HCL 10 MG PO TABS
10.0000 mg | ORAL_TABLET | Freq: Four times a day (QID) | ORAL | 0 refills | Status: DC | PRN
  Filled 2022-09-18: qty 90, 23d supply, fill #0

## 2022-09-19 ENCOUNTER — Encounter: Payer: Self-pay | Admitting: Oncology

## 2022-09-19 ENCOUNTER — Other Ambulatory Visit (HOSPITAL_COMMUNITY): Payer: Self-pay

## 2022-09-19 ENCOUNTER — Other Ambulatory Visit: Payer: Self-pay | Admitting: Oncology

## 2022-09-19 DIAGNOSIS — C61 Malignant neoplasm of prostate: Secondary | ICD-10-CM

## 2022-09-19 MED ORDER — ENZALUTAMIDE 40 MG PO TABS
160.0000 mg | ORAL_TABLET | Freq: Every day | ORAL | 0 refills | Status: DC
  Filled 2022-09-19 – 2022-09-28 (×2): qty 120, 30d supply, fill #0
  Filled 2022-09-28 (×2): qty 60, 15d supply, fill #0

## 2022-09-19 NOTE — Progress Notes (Signed)
  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   Intent: Curative  Radiation Treatment Dates: 06/07/2022 through 08/08/2022 Site Technique Total Dose (Gy) Dose per Fx (Gy) Completed Fx Beam Energies  Prostate: Prostate_Pelvis IMRT 45/45 1.8 25/25 6X  Prostate: Prostate_Bst_IMRT IMRT 30/30 2 15/15 6X  Ribs, Left: Chest_L_4thRib IMRT 40/40 8 5/5 6XFFF   (as documented in provider EOT note)   The patient was available for call today.  The patient did note fatigue during radiation but has since improved. The patient did not note skin changes in the field of radiation during therapy. The patient has noticed improvement in pain in the area(s) treated with radiation. The patient is not taking dexamethasone. The patient does not have symptoms of  weakness or loss of control of the extremities. The patient does not have symptoms of headache. The patient does not have symptoms of seizure or uncontrolled movement. The patient does not have symptoms of changes in vision. The patient does not have changes in speech. The patient does not have confusion.   The patient is scheduled for ongoing care with Dr. Alen Blew in medical oncology. The patient was encouraged to call if he  develops concerns or questions regarding radiation. He will continue follow up with his urologist, Dr. Abner Greenspan, as well.  This concludes the interview.   Leandra Kern, LPN

## 2022-09-20 ENCOUNTER — Other Ambulatory Visit: Payer: Self-pay

## 2022-09-20 ENCOUNTER — Telehealth: Payer: Self-pay

## 2022-09-20 ENCOUNTER — Other Ambulatory Visit (HOSPITAL_COMMUNITY): Payer: Self-pay

## 2022-09-20 ENCOUNTER — Encounter: Payer: Self-pay | Admitting: Gastroenterology

## 2022-09-20 ENCOUNTER — Ambulatory Visit
Admission: RE | Admit: 2022-09-20 | Discharge: 2022-09-20 | Disposition: A | Discharge status: Home / Self Care | Payer: BLUE CROSS/BLUE SHIELD | Source: Ambulatory Visit | Attending: Radiation Oncology | Admitting: Radiation Oncology

## 2022-09-20 ENCOUNTER — Other Ambulatory Visit (INDEPENDENT_AMBULATORY_CARE_PROVIDER_SITE_OTHER): Payer: Commercial Managed Care - HMO

## 2022-09-20 ENCOUNTER — Ambulatory Visit (INDEPENDENT_AMBULATORY_CARE_PROVIDER_SITE_OTHER): Payer: BLUE CROSS/BLUE SHIELD | Admitting: Gastroenterology

## 2022-09-20 ENCOUNTER — Telehealth: Payer: Self-pay | Admitting: *Deleted

## 2022-09-20 VITALS — BP 122/62 | HR 86 | Ht 67.0 in | Wt 183.0 lb

## 2022-09-20 DIAGNOSIS — R932 Abnormal findings on diagnostic imaging of liver and biliary tract: Secondary | ICD-10-CM | POA: Diagnosis not present

## 2022-09-20 DIAGNOSIS — K7469 Other cirrhosis of liver: Secondary | ICD-10-CM

## 2022-09-20 LAB — IBC + FERRITIN
Ferritin: 210.6 ng/mL (ref 22.0–322.0)
Iron: 88 ug/dL (ref 42–165)
Saturation Ratios: 28.1 % (ref 20.0–50.0)
TIBC: 313.6 ug/dL (ref 250.0–450.0)
Transferrin: 224 mg/dL (ref 212.0–360.0)

## 2022-09-20 NOTE — Telephone Encounter (Signed)
Called and spoke to patient. Requested that he go back to the lab for INR since the wrong lab was ordered today (IBC/Ferritin).  Patient was agreeable and will go to the lab tomorrow.

## 2022-09-20 NOTE — Patient Instructions (Addendum)
If you are age 63 or older, your body mass index should be between 23-30. Your Body mass index is 28.66 kg/m. If this is out of the aforementioned range listed, please consider follow up with your Primary Care Provider.  If you are age 30 or younger, your body mass index should be between 19-25. Your Body mass index is 28.66 kg/m. If this is out of the aformentioned range listed, please consider follow up with your Primary Care Provider.   ________________________________________________________  Please go to the lab in the basement of our building to have lab work done as you leave today. Hit "B" for basement when you get on the elevator.  When the doors open the lab is on your left.  We will call you with the results. Thank you.  Please avoid all NSAIDs.  You will be contacted by [facility] in the next 2 days to arrange a MRI of your Liver.  The number on your caller ID will be [phone], please answer when they call.  If you have not heard from them in 2 days please call [phone] to schedule:    You have been scheduled for an MRI at [facility], 1st floor, Radiology. Your appointment is scheduled on ___________________ at ______________. Please arrive 15 minutes prior to your appointment time for registration purposes. Please make certain not to have anything to eat or drink ________________ hours prior to your test. In addition, if you have any metal in your body, have a pacemaker or defibrillator, please be sure to let your ordering physician know. This test typically takes 45 minutes to 1 hour to complete. Should you need to reschedule, please call [phone].   You have been scheduled for an endoscopy. Please follow written instructions given to you at your visit today. If you use inhalers (even only as needed), please bring them with you on the day of your procedure.  Please follow up in 6 months.  Thank you for entrusting me with your care and for  choosing [facility], Dr. El Jebel Cellar

## 2022-09-20 NOTE — Telephone Encounter (Signed)
Patient called to ask if there were any contraindications of his receiving a cortisone injection in his knee while he is currently taking Xtandi.  He was a Dr Alen Blew patient but will be seeing Dr Lorenso Courier for future visits.  Routing to Dr Lorenso Courier to please advise.

## 2022-09-20 NOTE — Telephone Encounter (Signed)
Great thanks Jan 

## 2022-09-20 NOTE — Progress Notes (Signed)
Called lab. I put in a lab for IBC/Ferritin but the provider wanted a P t /INR.  Called the lab but they could not change the order. Patient will need to come back to lab for INR.

## 2022-09-20 NOTE — Telephone Encounter (Signed)
Confirmed with pharmacy and Dr. Lorenso Courier that there is no drug interaction between [facility] and cortisone injections for his knee issues. Patient appreciated call with this information.

## 2022-09-20 NOTE — Progress Notes (Signed)
HPI :  62 year old male with a history of cirrhosis, hepatitis C, alcohol use, metastatic prostate cancer, here to reestablish care for his cirrhosis.  Have not seen him since October 2019.  Recall he has a history of cirrhosis secondary to hepatitis C (treated with eradication) and alcohol use.  He had a history of small varices in the past but not on his last EGD exam in 2018  Unfortunately since have last seen him he was diagnosed with metastatic prostate cancer to the bone in July 2023.  He has been treated with radiation and on Xtandi and Eligard.  He states he is responding well to therapy and feeling well.  Fortunately since have last seen him he has not had any decompensations from his liver that I am aware of.  He denies any jaundice, no ascites, no encephalopathy, no variceal bleeding.  He states he had used some rare alcohol upwards of 6 months ago but has since stopped and not using any at all.  He denies any cardiopulmonary symptoms that bother him.  I reviewed his medication list, he has been using meloxicam daily for knee pain recently.  He states that did not help and was transition to ibuprofen, has been using 800 mg dose at a time lately for this.  He is not anything for his stomach otherwise for prophylaxis.  Denies any abdominal pains.  He had an MRI of his abdomen in June of last year which showed cirrhosis with multifocal arterial phase enhancing areas LI-RADS 3, repeat imaging in 3 to 6 months.  He has not had imaging done yet.  His last AFP was around that time that was normal.  He subsequently had a PET scan showing metastatic prostate cancer.  Last colonoscopy 11/2014 was normal without colon polyps.  PET scan 05/03/2022: IMPRESSION: 1. Tracer avid prostate primary or primaries with multifocal osseous metastasis. Direct comparison to the 02/16/2022 T06 PET is not applicable due to differences in tracer. The dominant left fourth rib and L4 lesions have undergone interval  sclerosis, suggesting healing. 2. No evidence of extraosseous metastatic disease. 3. Incidental findings, including: Aortic atherosclerosis (ICD10-I70.0) and emphysema (ICD10-J43.9). Advanced cirrhosis. Bilateral nephrolithiasis. Cholelithiasis. Sinus disease.   MRI abdomen 02/16/22: IMPRESSION: 1. Enhancing lesion in the L4 vertebral body suspicious for metastasis, which appears expansile dorsally causing spinal canal stenosis. Consider whole-body bone scan and/or MRI lumbar spine with contrast as indicated. 2. Hepatic cirrhosis with multifocal hepatic arterial phase enhancing areas as described, LI-RADS 3. Consider repeat imaging in 3-6 months. 3. Hepatic steatosis. 4. Cholelithiasis.   Past Medical History:  Diagnosis Date   Benign localized prostatic hyperplasia with lower urinary tract symptoms (LUTS)    followed by dr gay   Centrilobular emphysema [facility])    Cholelithiasis    Chronic pain    due to bone mets,  left side rib, back, pelvis   CL (cirrhosis of liver) (HCC)    secondary to hx HCV, alcohol and drug abuse   ED (erectile dysfunction)    Hepatic cirrhosis ([place])    History of COVID-19 06/2019   covid pneumonia w/ acute respriratory failure , hospital admission   History of drug abuse ([place])    per pt last used drugs late 1980s stated was only marjiuana/ alcohol, however,  documentation in epic states also did acid/ cocaine/ iv drug use   History of hepatitis C    per gi note due to hx iv drug use (pt denies hx IV drug use) ;  treated in 2012 with resolution per last GI progress office note in epic 06-20-2018   Mixed restrictive and obstructive lung disease ([place])    pulmologist--- dr Valeta Harms;   no oxygen   Moderate persistent asthma    followed by dr icard   PONV (postoperative nausea and vomiting)    Prostate cancer metastatic to bone [facility]) 03/08/2022   primary urologist-- dr gay/  oncologist--- dr Alen Blew;   pt dx 07/ 2023 via bone bx  lesion,   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 (left 4th rib/ L4)     Past Surgical History:  Procedure Laterality Date   COLONOSCOPY WITH PROPOFOL N/A 12/11/2014   Procedure: COLONOSCOPY WITH PROPOFOL;  Surgeon: Inda Castle, MD;  Location: [place];  Service: Endoscopy;  Laterality: N/A;   ESOPHAGEAL BANDING N/A 12/11/2014   Procedure: ESOPHAGEAL BANDING;  Surgeon: Inda Castle, MD;  Location: [place];  Service: Endoscopy;  Laterality: N/A;   ESOPHAGOGASTRODUODENOSCOPY N/A 06/06/2017   Procedure: ESOPHAGOGASTRODUODENOSCOPY (EGD);  Surgeon: Yetta Flock, MD;  Location: [facility];  Service: Gastroenterology;  Laterality: N/A;   ESOPHAGOGASTRODUODENOSCOPY (EGD) WITH PROPOFOL N/A 12/11/2014   Procedure: ESOPHAGOGASTRODUODENOSCOPY (EGD) WITH PROPOFOL;  Surgeon: Inda Castle, MD;  Location: [place];  Service: Endoscopy;  Laterality: N/A;   GOLD SEED IMPLANT N/A 05/19/2022   Procedure: GOLD SEED IMPLANT;  Surgeon: Franchot Gallo, MD;  Location: [facility];  Service: Urology;  Laterality: N/A;  ONLY NEEDS 30 MIN   KNEE ARTHROSCOPY Right    1980s   SPACE OAR INSTILLATION N/A 05/19/2022   Procedure: SPACE OAR INSTILLATION;  Surgeon: Franchot Gallo, MD;  Location: [facility];  Service: Urology;  Laterality: N/A;   Family History  Problem Relation Age of Onset   Diabetes Mother    Alzheimer's disease Mother    Diabetes Maternal Grandmother    Mesothelioma Father    Colon cancer Neg Hx    Stomach cancer Neg Hx    Social History   Tobacco Use   Smoking status: Former    Packs/day: 0.50    Years: 40.00    Total pack years: 20.00    Types: Cigarettes    Quit date: 2016    Years since quitting: 8.0   Smokeless tobacco: Never  Vaping Use   Vaping Use: Never used  Substance Use Topics   Alcohol use: Not Currently    Alcohol/week: 2.0 standard drinks of alcohol    Types: 2 Standard drinks or equivalent per week     Comment: 05-05-2022  per pt last alcohol 07/ 2023, hx abuse   Drug use: Not Currently    Comment: 05-05-2022  per pt last used drugs late 1980s - marijuana, acid (denies cocoaine / or IV drug, however there is documentation in epic )   Current Outpatient Medications  Medication Sig Dispense Refill   albuterol (PROVENTIL) (2.5 MG/3ML) 0.083% nebulizer solution Take 3 mLs (2.5 mg total) by nebulization every 6 (six) hours as needed for wheezing or shortness of breath. 75 mL 12   Budeson-Glycopyrrol-Formoterol (BREZTRI AEROSPHERE) 160-9-4.8 MCG/ACT AERO Inhale 2 puffs into the lungs in the morning and at bedtime. (Patient taking differently: Inhale 2 puffs into the lungs in the morning and at bedtime.) 10.7 g 6   enzalutamide (XTANDI) 40 MG tablet Take 4 tablets (160 mg total) by mouth daily. 120 tablet 0   meloxicam (MOBIC) 15 MG tablet Take 1 tablet (15 mg total) by mouth daily. 30 tablet 0  Oxycodone HCl 10 MG TABS Take 1 tablet (10 mg total) by mouth every 6 (six) hours as needed. 90 tablet 0   tamsulosin (FLOMAX) 0.4 MG CAPS capsule Take 1 capsule by mouth at bedtime (Patient taking differently: Take 0.4 mg by mouth at bedtime.) 90 capsule 3   cyclobenzaprine (FLEXERIL) 10 MG tablet Take 1 tablet (10 mg total) by mouth 2 (two) times daily between meals as needed for muscle spasms. 30 tablet 0   No current facility-administered medications for this visit.   Allergies  Allergen Reactions   Other Nausea And Vomiting    Narcotics- pt states he does not like to take narcotics d/t makes him sick     Review of Systems: All systems reviewed and negative except where noted in HPI.   Lab Results  Component Value Date   WBC 2.3 (L) 07/06/2022   HGB 12.8 (L) 07/06/2022   HCT 37.1 (L) 07/06/2022   MCV 91.8 07/06/2022   PLT 108 (L) 07/06/2022    Lab Results  Component Value Date   CREATININE 1.02 07/06/2022   BUN 10 07/06/2022   NA 144 07/06/2022   K 4.1 07/06/2022   CL 105 07/06/2022    CO2 28 07/06/2022    Lab Results  Component Value Date   ALT 22 07/06/2022   AST 25 07/06/2022   ALKPHOS 56 07/06/2022   BILITOT 0.8 07/06/2022     Physical Exam: BP 122/62   Pulse 86   Ht '5\' 7"'$  (1.702 m)   Wt 183 lb (83 kg)   BMI 28.66 kg/m  Constitutional: Pleasant,well-developed, male in no acute distress. HEENT: Normocephalic and atraumatic. Conjunctivae are normal. No scleral icterus. Neck supple.  Cardiovascular: Normal rate, regular rhythm.  Pulmonary/chest: Effort normal and breath sounds normal.  Abdominal: Soft, nontender.  There are no masses palpable.  Extremities: no edema Neurological: Alert and oriented to person place and time. Skin: Skin is warm and dry. No rashes noted. Psychiatric: Normal mood and affect. Behavior is normal.   ASSESSMENT: 62 y.o. male here for assessment of the following  1. Other cirrhosis of liver ([place])   2. Abnormal MRI, liver    Cirrhosis secondary to history of hep C and significant alcohol use.  History of small varices in the past, otherwise has been compensated.  He has been using some alcohol over the past year and we discussed that and recommend complete abstinence.  I reviewed the last MRI of his liver with him and recommend follow-up surveillance MRI to make sure no concerning lesions in the liver.  Also would repeat AFP at this time, also due for INR.  He is agreeable to this.  He is due for EGD otherwise to screen for esophageal varices.  I discussed with this is with him, risks and benefits of that and anesthesia and he wants to proceed.  Will schedule for EGD in the [place].  Otherwise he has been responding to his prostate cancer which seems stable per review of oncology notes.  He will continue to follow-up with them.  Otherwise incidentally noted to be taking a fair amount of NSAIDs.  Related that that is not a good idea in light of his liver disease, at risk for PUD / GI bleeding, and recommend he abstain from routine use  of NSAIDs and should use Tylenol as needed for joint pains.  He agrees  PLAN: - schedule for liver MRI - lab for AFP, INR - schedule for EGD at the [facility] -  recommended alcohol abstinence - recommend against routine use of NSAIDS - recommend tylenol PRN for joint pains.  - f/u 6 months in the office  Jolly Mango, MD [facility] Gastroenterology

## 2022-09-21 ENCOUNTER — Other Ambulatory Visit (INDEPENDENT_AMBULATORY_CARE_PROVIDER_SITE_OTHER): Payer: BLUE CROSS/BLUE SHIELD

## 2022-09-21 DIAGNOSIS — K7469 Other cirrhosis of liver: Secondary | ICD-10-CM | POA: Diagnosis not present

## 2022-09-21 LAB — PROTIME-INR
INR: 1 ratio (ref 0.8–1.0)
Prothrombin Time: 11.5 s (ref 9.6–13.1)

## 2022-09-22 ENCOUNTER — Other Ambulatory Visit (HOSPITAL_COMMUNITY): Payer: Self-pay

## 2022-09-22 LAB — AFP TUMOR MARKER: AFP-Tumor Marker: 4.7 ng/mL (ref <6.1)

## 2022-09-23 ENCOUNTER — Telehealth: Payer: Self-pay | Admitting: Hematology and Oncology

## 2022-09-23 NOTE — Telephone Encounter (Signed)
Called patient patient per in basket message. Patient rescheduled and notified.

## 2022-09-28 ENCOUNTER — Telehealth: Payer: Self-pay | Admitting: Pharmacy Technician

## 2022-09-28 ENCOUNTER — Encounter: Payer: Self-pay | Admitting: Oncology

## 2022-09-28 ENCOUNTER — Other Ambulatory Visit (HOSPITAL_COMMUNITY): Payer: Self-pay

## 2022-09-28 NOTE — Telephone Encounter (Addendum)
Oral Oncology Patient Advocate Encounter  Prior Authorization for Dylan [name] has been approved.    PA# [id number] Effective dates: 09/28/22 through 09/27/23  Patients co-pay is $1,150. Patient must fill 15 day supply for first 2 months on new insurance plan    Jinger Neighbors, CPhT-Adv Oncology Pharmacy Patient Advocate [facility] Direct Number: [phone]  Fax: [phone]

## 2022-09-28 NOTE — Telephone Encounter (Signed)
Oral Oncology Patient Advocate Encounter   Received notification that prior authorization for Gillermina Phy is due for renewal.   PA submitted on 09/28/22 Key [id number] Status is pending     Lady Deutscher, CPhT-Adv Oncology Pharmacy Patient [place] Direct Number: [phone]  Fax: [phone]

## 2022-10-01 ENCOUNTER — Encounter: Payer: Self-pay | Admitting: Oncology

## 2022-10-06 ENCOUNTER — Encounter: Payer: Self-pay | Admitting: Hematology and Oncology

## 2022-10-07 ENCOUNTER — Encounter (HOSPITAL_COMMUNITY): Payer: Self-pay

## 2022-10-07 ENCOUNTER — Ambulatory Visit (HOSPITAL_COMMUNITY): Payer: Medicaid Other

## 2022-10-11 ENCOUNTER — Inpatient Hospital Stay (HOSPITAL_BASED_OUTPATIENT_CLINIC_OR_DEPARTMENT_OTHER): Payer: Commercial Managed Care - HMO | Admitting: Hematology and Oncology

## 2022-10-11 ENCOUNTER — Inpatient Hospital Stay: Payer: Commercial Managed Care - HMO | Attending: Physician Assistant

## 2022-10-11 ENCOUNTER — Other Ambulatory Visit: Payer: Self-pay | Admitting: Hematology and Oncology

## 2022-10-11 ENCOUNTER — Other Ambulatory Visit (HOSPITAL_COMMUNITY): Payer: Self-pay

## 2022-10-11 ENCOUNTER — Inpatient Hospital Stay: Payer: Commercial Managed Care - HMO

## 2022-10-11 VITALS — BP 134/86 | HR 99 | Temp 98.4°F | Resp 16 | Wt 180.5 lb

## 2022-10-11 DIAGNOSIS — C7951 Secondary malignant neoplasm of bone: Secondary | ICD-10-CM

## 2022-10-11 DIAGNOSIS — M899 Disorder of bone, unspecified: Secondary | ICD-10-CM

## 2022-10-11 DIAGNOSIS — C61 Malignant neoplasm of prostate: Secondary | ICD-10-CM | POA: Diagnosis present

## 2022-10-11 LAB — CBC WITH DIFFERENTIAL (CANCER CENTER ONLY)
Abs Immature Granulocytes: 0.01 10*3/uL (ref 0.00–0.07)
Basophils Absolute: 0 10*3/uL (ref 0.0–0.1)
Basophils Relative: 1 %
Eosinophils Absolute: 0.5 10*3/uL (ref 0.0–0.5)
Eosinophils Relative: 15 %
HCT: 38.3 % — ABNORMAL LOW (ref 39.0–52.0)
Hemoglobin: 13 g/dL (ref 13.0–17.0)
Immature Granulocytes: 0 %
Lymphocytes Relative: 21 %
Lymphs Abs: 0.6 10*3/uL — ABNORMAL LOW (ref 0.7–4.0)
MCH: 31.6 pg (ref 26.0–34.0)
MCHC: 33.9 g/dL (ref 30.0–36.0)
MCV: 93.2 fL (ref 80.0–100.0)
Monocytes Absolute: 0.4 10*3/uL (ref 0.1–1.0)
Monocytes Relative: 14 %
Neutro Abs: 1.4 10*3/uL — ABNORMAL LOW (ref 1.7–7.7)
Neutrophils Relative %: 49 %
Platelet Count: 175 10*3/uL (ref 150–400)
RBC: 4.11 MIL/uL — ABNORMAL LOW (ref 4.22–5.81)
RDW: 13.1 % (ref 11.5–15.5)
WBC Count: 3 10*3/uL — ABNORMAL LOW (ref 4.0–10.5)
nRBC: 0 % (ref 0.0–0.2)

## 2022-10-11 LAB — CMP (CANCER CENTER ONLY)
ALT: 13 U/L (ref 0–44)
AST: 18 U/L (ref 15–41)
Albumin: 4.3 g/dL (ref 3.5–5.0)
Alkaline Phosphatase: 71 U/L (ref 38–126)
Anion gap: 7 (ref 5–15)
BUN: 13 mg/dL (ref 8–23)
CO2: 31 mmol/L (ref 22–32)
Calcium: 9.7 mg/dL (ref 8.9–10.3)
Chloride: 104 mmol/L (ref 98–111)
Creatinine: 1 mg/dL (ref 0.61–1.24)
GFR, Estimated: >60 mL/min (ref >60)
Glucose, Bld: 134 mg/dL — ABNORMAL HIGH (ref 70–99)
Potassium: 3.7 mmol/L (ref 3.5–5.1)
Sodium: 142 mmol/L (ref 135–145)
Total Bilirubin: 1 mg/dL (ref 0.3–1.2)
Total Protein: 7.7 g/dL (ref 6.5–8.1)

## 2022-10-11 MED ORDER — OXYCODONE HCL 10 MG PO TABS
10.0000 mg | ORAL_TABLET | Freq: Four times a day (QID) | ORAL | 0 refills | Status: DC | PRN
  Filled 2022-10-11: qty 90, 23d supply, fill #0

## 2022-10-11 MED ORDER — MELOXICAM 15 MG PO TABS
15.0000 mg | ORAL_TABLET | Freq: Every day | ORAL | 1 refills | Status: DC
  Filled 2022-10-11: qty 30, 30d supply, fill #0
  Filled 2022-11-01 (×3): qty 30, 30d supply, fill #1

## 2022-10-11 NOTE — Progress Notes (Signed)
[place] [phone]   [phone]  PROGRESS NOTE  Patient Care Team: [name], MD as PCP - General (Oncology) Katheren Puller, RN as Oncology Nurse Navigator  Hematological/Oncological History # Metastatic Castrate Sensitive Prostate Cancer  03/18/2022: start of Xtandi 160 mg PO daily.  07/06/2022: last visit with Dr. Alen Blew. Was on Eligard 30 mg q 4 months and Xtandi 160 mg  10/11/2022: establish care with Dr. Lorenso Courier   Interval History:  Dylan [name] 62 y.o. male with medical history significant for [facility] who presents for a follow up visit. The patient's last visit was on 07/06/2022 with Dr. Alen Blew. In the interim since the last visit he has continued on eligard and Xtandi.  On exam today Dylan [name] notes he has been well overall in the interim since his last visit with Dr. Alen Blew in November 2023.  He reports that he is undergoing radiation treatment and unfortunately 1 week later developed swelling in his left knee.  He reports he is having difficulty with his lower extremities bilaterally.  He has been tied in with orthopedics who started him on meloxicam and also "drew fluid off his legs".  He reports that he has been "walking like Frankenstein".  He also notes some pressure on his back.  He notes that he is taking oxycodone approximately 4 times a day as needed as prescribed.  He notes that he receives his Lupron shots at home and last received 1 in October and is due again this month.  He has experienced hot flashes with these medications but no other concerning side effects.  He denies any fevers, chills, sweats, nausea, vomiting or diarrhea.  Overall he is willing and able to proceed with treatment at this time.  A full 10 point ROS was otherwise negative.   MEDICAL HISTORY:  Past Medical History:  Diagnosis Date   Benign localized prostatic hyperplasia with lower urinary tract symptoms (LUTS)    followed by dr gay   Centrilobular emphysema  ([place])    Cholelithiasis    Chronic pain    due to bone mets,  left side rib, back, pelvis   CL (cirrhosis of liver) (HCC)    secondary to hx HCV, alcohol and drug abuse   ED (erectile dysfunction)    Hepatic cirrhosis ([place])    History of COVID-19 06/2019   covid pneumonia w/ acute respriratory failure , hospital admission   History of drug abuse ([place])    per pt last used drugs late 1980s stated was only marjiuana/ alcohol, however,  documentation in epic states also did acid/ cocaine/ iv drug use   History of hepatitis C    per gi note due to hx iv drug use (pt denies hx IV drug use) ;   treated in 2012 with resolution per last GI progress office note in epic 06-20-2018   Mixed restrictive and obstructive lung disease ([place])    pulmologist--- dr Valeta Harms;   no oxygen   Moderate persistent asthma    followed by dr icard   PONV (postoperative nausea and vomiting)    Prostate cancer metastatic to bone [facility]) 03/08/2022   primary urologist-- dr gay/  oncologist--- dr Alen Blew;   pt dx 07/ 2023 via bone bx  lesion,  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 (left 4th rib/ L4)    SURGICAL HISTORY: Past Surgical History:  Procedure Laterality Date   COLONOSCOPY WITH PROPOFOL N/A 12/11/2014   Procedure: COLONOSCOPY WITH PROPOFOL;  Surgeon: Sandy Salaam  Deatra Ina, MD;  Location: [place];  Service: Endoscopy;  Laterality: N/A;   ESOPHAGEAL BANDING N/A 12/11/2014   Procedure: ESOPHAGEAL BANDING;  Surgeon: Inda Castle, MD;  Location: [place];  Service: Endoscopy;  Laterality: N/A;   ESOPHAGOGASTRODUODENOSCOPY N/A 06/06/2017   Procedure: ESOPHAGOGASTRODUODENOSCOPY (EGD);  Surgeon: Yetta Flock, MD;  [facility];  Service: Gastroenterology;  Laterality: N/A;   ESOPHAGOGASTRODUODENOSCOPY (EGD) WITH PROPOFOL N/A 12/11/2014   Procedure: ESOPHAGOGASTRODUODENOSCOPY (EGD) WITH PROPOFOL;  Surgeon: Inda Castle, MD;  Location: [place];  Service: Endoscopy;  Laterality:  N/A;   GOLD SEED IMPLANT N/A 05/19/2022   Procedure: GOLD SEED IMPLANT;  Surgeon: Franchot Gallo, MD;  Location: [facility];  Service: Urology;  Laterality: N/A;  ONLY NEEDS 30 MIN   KNEE ARTHROSCOPY Right    1980s   SPACE OAR INSTILLATION N/A 05/19/2022   Procedure: SPACE OAR INSTILLATION;  Surgeon: Franchot Gallo, MD;  [facility];  Service: Urology;  Laterality: N/A;    SOCIAL HISTORY: Social History   Socioeconomic History   Marital status: Divorced    Spouse name: Not on file   Number of children: 0   Years of education: Not on file   Highest education level: Not on file  Occupational History   Occupation: Secondary school teacher  Tobacco Use   Smoking status: Former    Packs/day: 0.50    Years: 40.00    Total pack years: 20.00    Types: Cigarettes    Quit date: 2016    Years since quitting: 8.1   Smokeless tobacco: Never  Vaping Use   Vaping Use: Never used  Substance and Sexual Activity   Alcohol use: Not Currently    Alcohol/week: 2.0 standard drinks of alcohol    Types: 2 Standard drinks or equivalent per week    Comment: 05-05-2022  per pt last alcohol 07/ 2023, hx abuse   Drug use: Not Currently    Comment: 05-05-2022  per pt last used drugs late 1980s - marijuana, acid (denies cocoaine / or IV drug, however there is documentation in epic )   Sexual activity: Yes  Other Topics Concern   Not on file  Social History Narrative   Not on file   Social Determinants of Health   Financial Resource Strain: Medium Risk (03/14/2022)   Overall Financial Resource Strain (CARDIA)    Difficulty of Paying Living Expenses: Somewhat hard  Food Insecurity: Food Insecurity Present (03/14/2022)   Hunger Vital Sign    Worried About Running Out of Food in the Last Year: Sometimes true    Ran Out of Food in the Last Year: Sometimes true  Transportation Needs: No Transportation Needs (03/14/2022)   PRAPARE - Radiographer, therapeutic (Medical): No    Lack of Transportation (Non-Medical): No  Physical Activity: Not on file  Stress: Not on file  Social Connections: Not on file  Intimate Partner Violence: Not on file    FAMILY HISTORY: Family History  Problem Relation Age of Onset   Diabetes Mother    Alzheimer's disease Mother    Diabetes Maternal Grandmother    Mesothelioma Father    Colon cancer Neg Hx    Stomach cancer Neg Hx     ALLERGIES:  is allergic to other.  MEDICATIONS:  Current Outpatient Medications  Medication Sig Dispense Refill   albuterol (PROVENTIL) (2.5 MG/3ML) 0.083% nebulizer solution Take 3 mLs (2.5 mg total) by nebulization every 6 (six) hours as  needed for wheezing or shortness of breath. 75 mL 12   Budeson-Glycopyrrol-Formoterol (BREZTRI AEROSPHERE) 160-9-4.8 MCG/ACT AERO Inhale 2 puffs into the lungs in the morning and at bedtime. (Patient taking differently: Inhale 2 puffs into the lungs in the morning and at bedtime.) 10.7 g 6   cyclobenzaprine (FLEXERIL) 10 MG tablet Take 1 tablet (10 mg total) by mouth 2 (two) times daily between meals as needed for muscle spasms. 30 tablet 0   enzalutamide (XTANDI) 40 MG tablet Take 4 tablets (160 mg total) by mouth daily. 120 tablet 0   meloxicam (MOBIC) 15 MG tablet Take 1 tablet (15 mg total) by mouth daily. 30 tablet 0   Oxycodone HCl 10 MG TABS Take 1 tablet (10 mg total) by mouth every 6 (six) hours as needed. 90 tablet 0   tamsulosin (FLOMAX) 0.4 MG CAPS capsule Take 1 capsule by mouth at bedtime (Patient taking differently: Take 0.4 mg by mouth at bedtime.) 90 capsule 3   No current facility-administered medications for this visit.    REVIEW OF SYSTEMS:   Constitutional: ( - ) fevers, ( - )  chills , ( - ) night sweats Eyes: ( - ) blurriness of vision, ( - ) double vision, ( - ) watery eyes Ears, nose, mouth, throat, and face: ( - ) mucositis, ( - ) sore throat Respiratory: ( - ) cough, ( - ) dyspnea, ( - )  wheezes Cardiovascular: ( - ) palpitation, ( - ) chest discomfort, ( - ) lower extremity swelling Gastrointestinal:  ( - ) nausea, ( - ) heartburn, ( - ) change in bowel habits Skin: ( - ) abnormal skin rashes Lymphatics: ( - ) new lymphadenopathy, ( - ) easy bruising Neurological: ( - ) numbness, ( - ) tingling, ( - ) new weaknesses Behavioral/Psych: ( - ) mood change, ( - ) new changes  All other systems were reviewed with the patient and are negative.  PHYSICAL EXAMINATION: Vitals:   10/11/22 1121  BP: 134/86  Pulse: 99  Resp: 16  Temp: 98.4 F (36.9 C)  SpO2: 100%   Filed Weights   10/11/22 1121  Weight: 180 lb 8 oz (81.9 kg)    GENERAL: well appearing middle aged [place] American male, alert, no distress and comfortable SKIN: skin color, texture, turgor are normal, no rashes or significant lesions EYES: conjunctiva are pink and non-injected, sclera clear LUNGS: clear to auscultation and percussion with normal breathing effort Musculoskeletal: no cyanosis of digits and no clubbing  PSYCH: alert & oriented x 3, fluent speech NEURO: no focal motor/sensory deficits  LABORATORY DATA:  I have reviewed the data as listed    Latest Ref Rng & Units 10/11/2022   10:56 AM 07/06/2022    2:35 PM 05/05/2022   12:32 PM  CBC  WBC 4.0 - 10.5 K/uL 3.0  2.3  3.9   Hemoglobin 13.0 - 17.0 g/dL 13.0  12.8  14.1   Hematocrit 39.0 - 52.0 % 38.3  37.1  41.7   Platelets 150 - 400 K/uL 175  108  160        Latest Ref Rng & Units 10/11/2022   10:56 AM 07/06/2022    2:35 PM 05/05/2022   12:32 PM  CMP  Glucose 70 - 99 mg/dL 134  139  106   BUN 8 - 23 mg/dL 13  10  18   $ Creatinine 0.61 - 1.24 mg/dL 1.00  1.02  1.32   Sodium 135 - 145 mmol/L  142  144  142   Potassium 3.5 - 5.1 mmol/L 3.7  4.1  4.3   Chloride 98 - 111 mmol/L 104  105  107   CO2 22 - 32 mmol/L 31  28  30   $ Calcium 8.9 - 10.3 mg/dL 9.7  9.4  10.0   Total Protein 6.5 - 8.1 g/dL 7.7  7.4  7.5   Total Bilirubin 0.3 - 1.2 mg/dL  1.0  0.8  0.9   Alkaline Phos 38 - 126 U/L 71  56  81   AST 15 - 41 U/L 18  25  26   $ ALT 0 - 44 U/L 13  22  26     $ Lab Results  Component Value Date   MPROTEIN Not Observed 02/11/2022   Lab Results  Component Value Date   KPAFRELGTCHN 23.5 (H) 02/11/2022   LAMBDASER 13.8 02/11/2022   KAPLAMBRATIO 1.70 (H) 02/11/2022    RADIOGRAPHIC STUDIES: No results found.  ASSESSMENT & PLAN Dylan [name] 62 y.o. male with medical history significant for [facility] who presents for a follow up visit.  # Metastatic Castrate Resistant Prostate Cancer --continue Xtandi 160 mg PO daily -- continue leuprolide 30 mg q 4 months. This will need to be continued indefinitely --labs at each visit to include CBC, CMP, Testosterone, and PSA -- last PSA 0.1 on 07/06/2022. Initial PSA 26 -- s/p radiation therapy to bone metastasis and lymph nodes.  --labs today show white blood cell 3.0, hemoglobin 13.0, MCV 93.2, and platelets 175. -- Return to clinic in 3 months time to continue monitoring   No orders of the defined types were placed in this encounter.   All questions were answered. The patient knows to call the clinic with any problems, questions or concerns.  A total of more than 40 minutes were spent on this encounter with face-to-face time and non-face-to-face time, including preparing to see the patient, ordering tests and/or medications, counseling the patient and coordination of care as outlined above.   Ledell Peoples, MD Department of Hematology/Oncology [place] at [facility] Phone: [phone] Pager: [phone] Email: [email or web address]  10/11/2022 2:40 PM

## 2022-10-12 ENCOUNTER — Telehealth: Payer: Self-pay | Admitting: Hematology and Oncology

## 2022-10-12 LAB — TESTOSTERONE: Testosterone: 26 ng/dL — ABNORMAL LOW (ref 264–916)

## 2022-10-12 LAB — PROSTATE-SPECIFIC AG, SERUM (LABCORP): Prostate Specific Ag, Serum: <0.1 ng/mL (ref 0.0–4.0)

## 2022-10-12 NOTE — Telephone Encounter (Signed)
Called patient per 2/13 los notes to schedule f/u. Patient scheduled and notified

## 2022-10-13 ENCOUNTER — Other Ambulatory Visit (HOSPITAL_COMMUNITY): Payer: Self-pay

## 2022-10-15 ENCOUNTER — Ambulatory Visit (HOSPITAL_COMMUNITY)
Admission: RE | Admit: 2022-10-15 | Discharge: 2022-10-15 | Disposition: A | Discharge status: Home / Self Care | Payer: Commercial Managed Care - HMO | Source: Ambulatory Visit | Attending: Gastroenterology | Admitting: Gastroenterology

## 2022-10-15 DIAGNOSIS — K7469 Other cirrhosis of liver: Secondary | ICD-10-CM

## 2022-10-15 DIAGNOSIS — R932 Abnormal findings on diagnostic imaging of liver and biliary tract: Secondary | ICD-10-CM | POA: Diagnosis present

## 2022-10-15 MED ORDER — GADOXETATE DISODIUM 0.25 MMOL/ML IV SOLN
8.0000 mL | Freq: Once | INTRAVENOUS | Status: AC | PRN
  Administered 2022-10-15: 8 mL via INTRAVENOUS

## 2022-10-18 ENCOUNTER — Other Ambulatory Visit (HOSPITAL_COMMUNITY): Payer: Self-pay

## 2022-10-18 ENCOUNTER — Telehealth: Payer: Self-pay | Admitting: *Deleted

## 2022-10-18 NOTE — Telephone Encounter (Signed)
Pt was receiving Eligard at home. [place] [facility] faxed orders to be signed for medication and Nursing orders. Fax # [phone]. Ph: [phone]  Form completed and faxed back. Mr Schearer notified

## 2022-10-19 ENCOUNTER — Ambulatory Visit: Payer: Commercial Managed Care - HMO | Admitting: Gastroenterology

## 2022-10-19 ENCOUNTER — Encounter: Payer: Self-pay | Admitting: Gastroenterology

## 2022-10-19 VITALS — BP 130/80 | HR 76 | Temp 96.9°F | Resp 18 | Ht 67.0 in | Wt 183.0 lb

## 2022-10-19 DIAGNOSIS — I8510 Secondary esophageal varices without bleeding: Secondary | ICD-10-CM

## 2022-10-19 DIAGNOSIS — K7469 Other cirrhosis of liver: Secondary | ICD-10-CM

## 2022-10-19 DIAGNOSIS — I8500 Esophageal varices without bleeding: Secondary | ICD-10-CM

## 2022-10-19 MED ORDER — SODIUM CHLORIDE 0.9 % IV SOLN: 500.0000 mL | Freq: Once | INTRAVENOUS | Status: DC

## 2022-10-19 NOTE — Op Note (Signed)
[facility] Patient Name: Dylan [name] Procedure Date: 10/19/2022 4:04 PM MRN: [id number] Endoscopist: Remo Lipps P. Havery Moros , MD, [id number] Age: 62 Referring MD:  Date of Birth: [birth year] Gender: Male Account #: [id number] Procedure:                Upper GI endoscopy Indications:              cirrhosis - history of small esophageal varices on                            last exam in 2018. Here for repeat EGD to survey                            varices Medicines:                Monitored Anesthesia Care Procedure:                Pre-Anesthesia Assessment:                           - Prior to the procedure, a History and Physical                            was performed, and patient medications and                            allergies were reviewed. The patient's tolerance of                            previous anesthesia was also reviewed. The risks                            and benefits of the procedure and the sedation                            options and risks were discussed with the patient.                            All questions were answered, and informed consent                            was obtained. Prior Anticoagulants: The patient has                            taken no anticoagulant or antiplatelet agents. ASA                            Grade Assessment: III - A patient with severe                            systemic disease. After reviewing the risks and                            benefits, the patient was deemed in satisfactory  condition to undergo the procedure.                           After obtaining informed consent, the endoscope was                            passed under direct vision. Throughout the                            procedure, the patient's blood pressure, pulse, and                            oxygen saturations were monitored continuously. The                            Olympus Scope [id number] was introduced  through the                            mouth, and advanced to the second part of duodenum.                            The upper GI endoscopy was accomplished without                            difficulty. The patient tolerated the procedure                            well. Scope In: Scope Out: Findings:                 The Z-line was regular.                           A small hiatal hernia was present.                           Grade I varices were found in the middle third of                            the esophagus and in the lower third of the                            esophagus. They were small in size. No high risk                            stigmata for bleeding                           The exam of the esophagus was otherwise normal.                           The entire examined stomach was normal. No gastric                            varices.  There was some focal mild erythema in the second                            portion of the duodenum. The examined duodenum was                            otherwise normal. Complications:            No immediate complications. Estimated blood loss:                            None. Estimated Blood Loss:     Estimated blood loss: none. Impression:               - Z-line regular.                           - Small hiatal hernia.                           - Grade I esophageal varices.                           - Normal esophagus otherwise                           - Normal stomach. No varices                           - Mild duodenal erythema                           - Normal examined duodenum otherwise. Recommendation:           - Patient has a contact number available for                            emergencies. The signs and symptoms of potential                            delayed complications were discussed with the                            patient. Return to normal activities tomorrow.                             Written discharge instructions were provided to the                            patient.                           - Resume previous diet.                           - Continue present medications.                           -  Repeat upper endoscopy in 1 to 2 years for                            surveillance of varices. Remo Lipps P. Willeen Novak, MD 10/19/2022 4:31:15 PM This report has been signed electronically.

## 2022-10-19 NOTE — Progress Notes (Signed)
VS completed by DT.   I have reviewed the patient's medical history in detail and updated the computerized patient record.

## 2022-10-19 NOTE — Patient Instructions (Addendum)
YOU HAD AN ENDOSCOPIC PROCEDURE TODAY AT THE [facility]:   Refer to the procedure report that was given to you for any specific questions about what was found during the examination.  If the procedure report does not answer your questions, please call your gastroenterologist to clarify.  If you requested that your care partner not be given the details of your procedure findings, then the procedure report has been included in a sealed envelope for you to review at your convenience later.  YOU SHOULD EXPECT: Some feelings of bloating in the abdomen. Passage of more gas than usual.  Walking can help get rid of the air that was put into your GI tract during the procedure and reduce the bloating. If you had a lower endoscopy (such as a colonoscopy or flexible sigmoidoscopy) you may notice spotting of blood in your stool or on the toilet paper. If you underwent a bowel prep for your procedure, you may not have a normal bowel movement for a few days.  Please Note:  You might notice some irritation and congestion in your nose or some drainage.  This is from the oxygen used during your procedure.  There is no need for concern and it should clear up in a day or so.  SYMPTOMS TO REPORT IMMEDIATELY:   Following upper endoscopy (EGD)  Vomiting of blood or coffee ground material  New chest pain or pain under the shoulder blades  Painful or persistently difficult swallowing  New shortness of breath  Fever of 100F or higher  Black, tarry-looking stools  For urgent or emergent issues, a gastroenterologist can be reached at any hour by calling [phone]. Do not use MyChart messaging for urgent concerns.    DIET:  We do recommend a small meal at first, but then you may proceed to your regular diet.  Drink plenty of fluids but you should avoid alcoholic beverages for 24 hours.  ACTIVITY:  You should plan to take it easy for the rest of today and you should NOT DRIVE or use heavy machinery  until tomorrow (because of the sedation medicines used during the test).    FOLLOW UP: Our staff will call the number listed on your records the next business day following your procedure.  We will call around 7:15- 8:00 am to check on you and address any questions or concerns that you may have regarding the information given to you following your procedure. If we do not reach you, we will leave a message.     If any biopsies were taken you will be contacted by phone or by letter within the next 1-3 weeks.  Please call us at [phone] if you have not heard about the biopsies in 3 weeks.    SIGNATURES/CONFIDENTIALITY: You and/or your care partner have signed paperwork which will be entered into your electronic medical record.  These signatures attest to the fact that that the information above on your After Visit Summary has been reviewed and is understood.  Full responsibility of the confidentiality of this discharge information lies with you and/or your care-partner. 

## 2022-10-19 NOTE — Progress Notes (Signed)
Vss nad trans to pacu °

## 2022-10-19 NOTE — Progress Notes (Signed)
[facility] History and Physical   Primary Care Physician:  [name], MD   Reason for Procedure:   Cirrhosis - history of varices  Plan:    EGD     HPI: Dylan [name] is a 62 y.o. male  here for EGD to further evaluate history of small varices and cirrhosis - last EGD doe 2018, overdue.  Otherwise feels well without any cardiopulmonary symptoms.   I have discussed risks / benefits of anesthesia and endoscopic procedure with Dylan [name] and they wish to proceed with the exams as outlined today.    Past Medical History:  Diagnosis Date   Benign localized prostatic hyperplasia with lower urinary tract symptoms (LUTS)    followed by dr gay   Centrilobular emphysema [facility])    Cholelithiasis    Chronic pain    due to bone mets,  left side rib, back, pelvis   CL (cirrhosis of liver) (HCC)    secondary to hx HCV, alcohol and drug abuse   ED (erectile dysfunction)    Hepatic cirrhosis ([place])    History of COVID-19 06/2019   covid pneumonia w/ acute respriratory failure , hospital admission   History of drug abuse ([place])    per pt last used drugs late 1980s stated was only marjiuana/ alcohol, however,  documentation in epic states also did acid/ cocaine/ iv drug use   History of hepatitis C    per gi note due to hx iv drug use (pt denies hx IV drug use) ;   treated in 2012 with resolution per last GI progress office note in epic 06-20-2018   Mixed restrictive and obstructive lung disease ([place])    pulmologist--- dr Valeta Harms;   no oxygen   Moderate persistent asthma    followed by dr Valeta Harms   Oxygen deficiency    PONV (postoperative nausea and vomiting)    Prostate cancer metastatic to bone [facility]) 03/08/2022   primary urologist-- dr gay/  oncologist--- dr Alen Blew;   pt dx 07/ 2023 via bone bx  lesion,  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 (left 4th rib/ L4)    Past Surgical History:  Procedure Laterality Date   COLONOSCOPY     COLONOSCOPY WITH PROPOFOL  N/A 12/11/2014   Procedure: COLONOSCOPY WITH PROPOFOL;  Surgeon: Inda Castle, MD;  Location: [facility];  Service: Endoscopy;  Laterality: N/A;   ESOPHAGEAL BANDING N/A 12/11/2014   Procedure: ESOPHAGEAL BANDING;  Surgeon: Inda Castle, MD;  Location: [place];  Service: Endoscopy;  Laterality: N/A;   ESOPHAGOGASTRODUODENOSCOPY N/A 06/06/2017   Procedure: ESOPHAGOGASTRODUODENOSCOPY (EGD);  Surgeon: Yetta Flock, MD;  Location: [facility];  Service: Gastroenterology;  Laterality: N/A;   ESOPHAGOGASTRODUODENOSCOPY (EGD) WITH PROPOFOL N/A 12/11/2014   Procedure: ESOPHAGOGASTRODUODENOSCOPY (EGD) WITH PROPOFOL;  Surgeon: Inda Castle, MD;  Location: [facility];  Service: Endoscopy;  Laterality: N/A;   GOLD SEED IMPLANT N/A 05/19/2022   Procedure: GOLD SEED IMPLANT;  Surgeon: Franchot Gallo, MD;  Location: [facility];  Service: Urology;  Laterality: N/A;  ONLY NEEDS 30 MIN   KNEE ARTHROSCOPY Right    1980s   SPACE OAR INSTILLATION N/A 05/19/2022   Procedure: SPACE OAR INSTILLATION;  Surgeon: Franchot Gallo, MD;  Location: [facility];  Service: Urology;  Laterality: N/A;   UPPER GASTROINTESTINAL ENDOSCOPY      Prior to Admission medications   Medication Sig Start Date End Date Taking? Authorizing Provider  enzalutamide Gillermina Phy) 40 MG tablet Take 4  tablets (160 mg total) by mouth daily. 09/19/22  Yes Orson Slick, MD  meloxicam (MOBIC) 15 MG tablet Take 1 tablet (15 mg total) by mouth daily. 10/11/22  Yes   Oxycodone HCl 10 MG TABS Take 1 tablet (10 mg total) by mouth every 6 (six) hours as needed. 10/11/22  Yes Orson Slick, MD  albuterol (PROVENTIL) (2.5 MG/3ML) 0.083% nebulizer solution Take 3 mLs (2.5 mg total) by nebulization every 6 (six) hours as needed for wheezing or shortness of breath. 07/15/21   Jeanell Sparrow, DO  Budeson-Glycopyrrol-Formoterol (BREZTRI AEROSPHERE) 160-9-4.8 MCG/ACT AERO Inhale 2 puffs into the  lungs in the morning and at bedtime. Patient taking differently: Inhale 2 puffs into the lungs in the morning and at bedtime. 07/28/21   Icard, Octavio Graves, DO  cyclobenzaprine (FLEXERIL) 10 MG tablet Take 1 tablet (10 mg total) by mouth 2 (two) times daily between meals as needed for muscle spasms. 09/08/21   Charlott Rakes, MD  tamsulosin (FLOMAX) 0.4 MG CAPS capsule Take 1 capsule by mouth at bedtime Patient taking differently: Take 0.4 mg by mouth at bedtime. 04/06/22       Current Outpatient Medications  Medication Sig Dispense Refill   enzalutamide (XTANDI) 40 MG tablet Take 4 tablets (160 mg total) by mouth daily. 120 tablet 0   meloxicam (MOBIC) 15 MG tablet Take 1 tablet (15 mg total) by mouth daily. 30 tablet 1   Oxycodone HCl 10 MG TABS Take 1 tablet (10 mg total) by mouth every 6 (six) hours as needed. 90 tablet 0   albuterol (PROVENTIL) (2.5 MG/3ML) 0.083% nebulizer solution Take 3 mLs (2.5 mg total) by nebulization every 6 (six) hours as needed for wheezing or shortness of breath. 75 mL 12   Budeson-Glycopyrrol-Formoterol (BREZTRI AEROSPHERE) 160-9-4.8 MCG/ACT AERO Inhale 2 puffs into the lungs in the morning and at bedtime. (Patient taking differently: Inhale 2 puffs into the lungs in the morning and at bedtime.) 10.7 g 6   cyclobenzaprine (FLEXERIL) 10 MG tablet Take 1 tablet (10 mg total) by mouth 2 (two) times daily between meals as needed for muscle spasms. 30 tablet 0   tamsulosin (FLOMAX) 0.4 MG CAPS capsule Take 1 capsule by mouth at bedtime (Patient taking differently: Take 0.4 mg by mouth at bedtime.) 90 capsule 3   Current Facility-Administered Medications  Medication Dose Route Frequency Provider Last Rate Last Admin   0.9 %  sodium chloride infusion  500 mL Intravenous Once Emerly Prak, Carlota Raspberry, MD        Allergies as of 10/19/2022 - Review Complete 10/19/2022  Allergen Reaction Noted   Other Nausea And Vomiting 04/11/2014    Family History  Problem Relation Age of  Onset   Diabetes Mother    Alzheimer's disease Mother    Rectal cancer Father    Mesothelioma Father    Diabetes Maternal Grandmother    Colon cancer Neg Hx    Stomach cancer Neg Hx    Esophageal cancer Neg Hx     Social History   Socioeconomic History   Marital status: Divorced    Spouse name: Not on file   Number of children: 0   Years of education: Not on file   Highest education level: Not on file  Occupational History   Occupation: Secondary school teacher  Tobacco Use   Smoking status: Former    Packs/day: 0.50    Years: 40.00    Total pack years: 20.00    Types: Cigarettes  Quit date: 2015    Years since quitting: 9.1   Smokeless tobacco: Never  Vaping Use   Vaping Use: Never used  Substance and Sexual Activity   Alcohol use: Not Currently    Alcohol/week: 2.0 standard drinks of alcohol    Types: 2 Standard drinks or equivalent per week    Comment: 05-05-2022  per pt last alcohol 07/ 2023, hx abuse   Drug use: Not Currently    Comment: 05-05-2022  per pt last used drugs late 1980s - marijuana, acid (denies cocoaine / or IV drug, however there is documentation in epic )   Sexual activity: Yes  Other Topics Concern   Not on file  Social History Narrative   Not on file   Social Determinants of Health   Financial Resource Strain: Medium Risk (03/14/2022)   Overall Financial Resource Strain (CARDIA)    Difficulty of Paying Living Expenses: Somewhat hard  Food Insecurity: Food Insecurity Present (03/14/2022)   Hunger Vital Sign    Worried About Running Out of Food in the Last Year: Sometimes true    Ran Out of Food in the Last Year: Sometimes true  Transportation Needs: No Transportation Needs (03/14/2022)   PRAPARE - Hydrologist (Medical): No    Lack of Transportation (Non-Medical): No  Physical Activity: Not on file  Stress: Not on file  Social Connections: Not on file  Intimate Partner Violence: Not on file    Review of  Systems: All other review of systems negative except as mentioned in the HPI.  Physical Exam: Vital signs BP 133/73   Pulse 83   Temp (!) 96.9 F (36.1 C) (Temporal)   Ht 5' 7"$  (1.702 m)   Wt 183 lb (83 kg)   SpO2 97%   BMI 28.66 kg/m   General:   Alert,  Well-developed, pleasant and cooperative in NAD Lungs:  Clear throughout to auscultation.   Heart:  Regular rate and rhythm Abdomen:  Soft, nontender and nondistended.   Neuro/Psych:  Alert and cooperative. Normal mood and affect. A and O x 3  Jolly Mango, MD [facility]

## 2022-10-20 ENCOUNTER — Telehealth: Payer: Self-pay | Admitting: Gastroenterology

## 2022-10-20 ENCOUNTER — Telehealth: Payer: Self-pay | Admitting: *Deleted

## 2022-10-20 NOTE — Telephone Encounter (Signed)
noted 

## 2022-10-20 NOTE — Telephone Encounter (Signed)
PT received follow up call from procedure yesterday. He advised he is doing great and has had no issues

## 2022-10-20 NOTE — Telephone Encounter (Signed)
  Follow up Call-     10/19/2022    2:47 PM  Call back number  Post procedure Call Back phone  # [phone]  Permission to leave phone message Yes     Patient questions:  Message left to call if necessary.

## 2022-10-31 ENCOUNTER — Encounter: Payer: Self-pay | Admitting: Gastroenterology

## 2022-11-01 ENCOUNTER — Other Ambulatory Visit: Payer: Self-pay

## 2022-11-01 ENCOUNTER — Other Ambulatory Visit: Payer: Self-pay | Admitting: Hematology and Oncology

## 2022-11-01 ENCOUNTER — Other Ambulatory Visit (HOSPITAL_COMMUNITY): Payer: Self-pay

## 2022-11-01 ENCOUNTER — Encounter: Payer: Self-pay | Admitting: Hematology and Oncology

## 2022-11-01 DIAGNOSIS — C7951 Secondary malignant neoplasm of bone: Secondary | ICD-10-CM

## 2022-11-01 MED ORDER — ENZALUTAMIDE 40 MG PO TABS
160.0000 mg | ORAL_TABLET | Freq: Every day | ORAL | 0 refills | Status: DC
  Filled 2022-11-01 – 2022-11-02 (×2): qty 120, 30d supply, fill #0

## 2022-11-02 ENCOUNTER — Other Ambulatory Visit: Payer: Commercial Managed Care - HMO

## 2022-11-02 ENCOUNTER — Ambulatory Visit: Payer: Commercial Managed Care - HMO | Admitting: Oncology

## 2022-11-02 ENCOUNTER — Other Ambulatory Visit: Payer: Self-pay

## 2022-11-02 ENCOUNTER — Other Ambulatory Visit (HOSPITAL_COMMUNITY): Payer: Self-pay

## 2022-11-03 ENCOUNTER — Other Ambulatory Visit: Payer: Self-pay | Admitting: Hematology and Oncology

## 2022-11-03 ENCOUNTER — Other Ambulatory Visit (HOSPITAL_COMMUNITY): Payer: Self-pay

## 2022-11-03 DIAGNOSIS — C61 Malignant neoplasm of prostate: Secondary | ICD-10-CM

## 2022-11-04 ENCOUNTER — Other Ambulatory Visit (HOSPITAL_COMMUNITY): Payer: Self-pay

## 2022-11-04 MED ORDER — OXYCODONE HCL 10 MG PO TABS
10.0000 mg | ORAL_TABLET | Freq: Four times a day (QID) | ORAL | 0 refills | Status: DC | PRN
  Filled 2022-11-04: qty 90, 23d supply, fill #0

## 2022-11-08 ENCOUNTER — Telehealth: Payer: Self-pay | Admitting: Gastroenterology

## 2022-11-08 NOTE — Telephone Encounter (Signed)
The pt has been advised that he should follow up in the office in 6 months per last office note.

## 2022-11-08 NOTE — Telephone Encounter (Signed)
Inbound call from pt, he had a procedure recently and wants to know when do he need to come back for a follow up.Please advise

## 2022-11-11 ENCOUNTER — Ambulatory Visit: Payer: Commercial Managed Care - HMO | Admitting: Hematology and Oncology

## 2022-11-11 ENCOUNTER — Other Ambulatory Visit: Payer: Commercial Managed Care - HMO

## 2022-11-22 ENCOUNTER — Other Ambulatory Visit: Payer: Self-pay

## 2022-11-22 ENCOUNTER — Other Ambulatory Visit: Payer: Self-pay | Admitting: Hematology and Oncology

## 2022-11-22 ENCOUNTER — Other Ambulatory Visit (HOSPITAL_COMMUNITY): Payer: Self-pay

## 2022-11-22 DIAGNOSIS — C61 Malignant neoplasm of prostate: Secondary | ICD-10-CM

## 2022-11-22 MED ORDER — ENZALUTAMIDE 40 MG PO TABS
160.0000 mg | ORAL_TABLET | Freq: Every day | ORAL | 0 refills | Status: DC
  Filled 2022-11-22: qty 120, 30d supply, fill #0

## 2022-11-28 ENCOUNTER — Other Ambulatory Visit: Payer: Self-pay

## 2022-11-28 ENCOUNTER — Other Ambulatory Visit: Payer: Self-pay | Admitting: Physician Assistant

## 2022-11-28 DIAGNOSIS — C61 Malignant neoplasm of prostate: Secondary | ICD-10-CM

## 2022-12-01 ENCOUNTER — Other Ambulatory Visit (HOSPITAL_COMMUNITY): Payer: Self-pay

## 2022-12-01 MED ORDER — OXYCODONE HCL 10 MG PO TABS
10.0000 mg | ORAL_TABLET | Freq: Four times a day (QID) | ORAL | 0 refills | Status: DC | PRN
  Filled 2022-12-01: qty 90, 23d supply, fill #0

## 2022-12-01 NOTE — Telephone Encounter (Signed)
Dylan [name] left a message requesting a refill of Oxycodone (it was originally sent by the pharmacy to Dylan [name], [place])

## 2022-12-10 ENCOUNTER — Other Ambulatory Visit (HOSPITAL_COMMUNITY): Payer: Self-pay

## 2022-12-12 ENCOUNTER — Other Ambulatory Visit (HOSPITAL_COMMUNITY): Payer: Self-pay

## 2022-12-12 MED ORDER — MELOXICAM 15 MG PO TABS
15.0000 mg | ORAL_TABLET | Freq: Every day | ORAL | 1 refills | Status: DC
  Filled 2022-12-12: qty 30, 30d supply, fill #0
  Filled 2023-01-12: qty 30, 30d supply, fill #1

## 2022-12-20 ENCOUNTER — Telehealth: Payer: Self-pay | Admitting: *Deleted

## 2022-12-20 NOTE — Telephone Encounter (Signed)
Returned PC to patient, he states he has been having the same pressure he had when he was dx'd with prostate CA - pressure in spine & his ribs x 1 month & is concerned his cancer has recurred. He is requesting appointment with Dr Dorsey befLeonides Schanz his scheduled visit in May.  Per Dr Leonides Schanz, would like to have labs & see patient next week, scheduling message sent.  Informed patient scheduling department will be contacting him, he verbalizes understanding.

## 2022-12-22 ENCOUNTER — Other Ambulatory Visit: Payer: Self-pay | Admitting: Hematology and Oncology

## 2022-12-22 ENCOUNTER — Other Ambulatory Visit (HOSPITAL_COMMUNITY): Payer: Self-pay

## 2022-12-22 DIAGNOSIS — C7951 Secondary malignant neoplasm of bone: Secondary | ICD-10-CM

## 2022-12-22 MED ORDER — ENZALUTAMIDE 40 MG PO TABS
160.0000 mg | ORAL_TABLET | Freq: Every day | ORAL | 0 refills | Status: DC
  Filled 2022-12-22: qty 120, 30d supply, fill #0

## 2022-12-24 ENCOUNTER — Other Ambulatory Visit: Payer: Self-pay | Admitting: Physician Assistant

## 2022-12-24 DIAGNOSIS — C61 Malignant neoplasm of prostate: Secondary | ICD-10-CM

## 2022-12-26 ENCOUNTER — Other Ambulatory Visit: Payer: Self-pay | Admitting: Physician Assistant

## 2022-12-26 ENCOUNTER — Other Ambulatory Visit: Payer: Self-pay

## 2022-12-26 ENCOUNTER — Other Ambulatory Visit (HOSPITAL_COMMUNITY): Payer: Self-pay

## 2022-12-26 DIAGNOSIS — C61 Malignant neoplasm of prostate: Secondary | ICD-10-CM

## 2022-12-26 MED ORDER — OXYCODONE HCL 10 MG PO TABS
10.0000 mg | ORAL_TABLET | Freq: Four times a day (QID) | ORAL | 0 refills | Status: DC | PRN
  Filled 2022-12-26: qty 90, 23d supply, fill #0

## 2022-12-27 ENCOUNTER — Other Ambulatory Visit: Payer: Self-pay

## 2022-12-27 ENCOUNTER — Inpatient Hospital Stay (HOSPITAL_BASED_OUTPATIENT_CLINIC_OR_DEPARTMENT_OTHER): Payer: Commercial Managed Care - HMO | Admitting: Physician Assistant

## 2022-12-27 ENCOUNTER — Inpatient Hospital Stay: Payer: Commercial Managed Care - HMO | Attending: Physician Assistant

## 2022-12-27 VITALS — BP 146/91 | HR 77 | Temp 97.6°F | Resp 18 | Ht 67.0 in | Wt 182.6 lb

## 2022-12-27 DIAGNOSIS — C61 Malignant neoplasm of prostate: Secondary | ICD-10-CM | POA: Diagnosis not present

## 2022-12-27 DIAGNOSIS — R0781 Pleurodynia: Secondary | ICD-10-CM | POA: Diagnosis not present

## 2022-12-27 DIAGNOSIS — L609 Nail disorder, unspecified: Secondary | ICD-10-CM | POA: Diagnosis not present

## 2022-12-27 DIAGNOSIS — M545 Low back pain, unspecified: Secondary | ICD-10-CM

## 2022-12-27 DIAGNOSIS — C7951 Secondary malignant neoplasm of bone: Secondary | ICD-10-CM

## 2022-12-27 LAB — CBC WITH DIFFERENTIAL (CANCER CENTER ONLY)
Abs Immature Granulocytes: 0.01 10*3/uL (ref 0.00–0.07)
Basophils Absolute: 0 10*3/uL (ref 0.0–0.1)
Basophils Relative: 1 %
Eosinophils Absolute: 0.3 10*3/uL (ref 0.0–0.5)
Eosinophils Relative: 12 %
HCT: 38.9 % — ABNORMAL LOW (ref 39.0–52.0)
Hemoglobin: 13.2 g/dL (ref 13.0–17.0)
Immature Granulocytes: 0 %
Lymphocytes Relative: 27 %
Lymphs Abs: 0.7 10*3/uL (ref 0.7–4.0)
MCH: 31.1 pg (ref 26.0–34.0)
MCHC: 33.9 g/dL (ref 30.0–36.0)
MCV: 91.5 fL (ref 80.0–100.0)
Monocytes Absolute: 0.5 10*3/uL (ref 0.1–1.0)
Monocytes Relative: 16 %
Neutro Abs: 1.2 10*3/uL — ABNORMAL LOW (ref 1.7–7.7)
Neutrophils Relative %: 44 %
Platelet Count: 149 10*3/uL — ABNORMAL LOW (ref 150–400)
RBC: 4.25 MIL/uL (ref 4.22–5.81)
RDW: 13.2 % (ref 11.5–15.5)
WBC Count: 2.8 10*3/uL — ABNORMAL LOW (ref 4.0–10.5)
nRBC: 0 % (ref 0.0–0.2)

## 2022-12-27 LAB — CMP (CANCER CENTER ONLY)
ALT: 15 U/L (ref 0–44)
AST: 18 U/L (ref 15–41)
Albumin: 4.3 g/dL (ref 3.5–5.0)
Alkaline Phosphatase: 83 U/L (ref 38–126)
Anion gap: 7 (ref 5–15)
BUN: 12 mg/dL (ref 8–23)
CO2: 27 mmol/L (ref 22–32)
Calcium: 9.7 mg/dL (ref 8.9–10.3)
Chloride: 107 mmol/L (ref 98–111)
Creatinine: 0.99 mg/dL (ref 0.61–1.24)
GFR, Estimated: >60 mL/min (ref >60)
Glucose, Bld: 107 mg/dL — ABNORMAL HIGH (ref 70–99)
Potassium: 3.9 mmol/L (ref 3.5–5.1)
Sodium: 141 mmol/L (ref 135–145)
Total Bilirubin: 0.7 mg/dL (ref 0.3–1.2)
Total Protein: 7.4 g/dL (ref 6.5–8.1)

## 2022-12-27 NOTE — Progress Notes (Signed)
[facility] [phone]   [phone]  PROGRESS NOTE  Patient Care Team: Benjiman Core, MD as PCP - General (Oncology) Cherlyn Cushing, RN as Oncology Nurse Navigator  Hematological/Oncological History # Metastatic Castrate Sensitive Prostate Cancer  03/18/2022: start of Xtandi 160 mg PO daily.  07/06/2022: last visit with Dr. Clelia Croft. Was on Eligard 30 mg q 4 months and Xtandi 160 mg  10/11/2022: establish care with Dr. Leonides Schanz   Interval History:  Dylan [name] 62 y.o. male with medical history significant for [facility] who presents for a follow up visit. The patient's last visit was on 10/11/2022 with Dr. Leonides Schanz. In the interim since the last visit he has continued on eligard and Xtandi.  On exam today Dylan [name] notes he has noticed worsening pain under his left rib and low back pain for the past month. He reports the pain in his low back is mainly increased pressure. He is taking oxycodone 10 mg q 6 hours as needed with improvement of pain. He reports his energy levels are fairly stable and he is able to complete his ADLs on his own. He denies any nausea, vomiting or abdominal pain. His bowel habits are unchanged. He denies easy bruising or signs of bleeding. He reports darkening pigmentation of his left big toe nail. He denies fevers, chills, sweats, shortness of breath, chest pain or cough. He has no other complaints. Overall he is willing and able to proceed with treatment at this time.  A full 10 point ROS was otherwise negative.   MEDICAL HISTORY:  Past Medical History:  Diagnosis Date   Benign localized prostatic hyperplasia with lower urinary tract symptoms (LUTS)    followed by dr gay   Centrilobular emphysema (HCC)    Cholelithiasis    Chronic pain    due to bone mets,  left side rib, back, pelvis   CL (cirrhosis of liver) (HCC)    secondary to hx HCV, alcohol and drug abuse   ED (erectile dysfunction)    Hepatic cirrhosis (HCC)    History of  COVID-19 06/2019   covid pneumonia w/ acute respriratory failure , hospital admission   History of drug abuse (HCC)    per pt last used drugs late 1980s stated was only marjiuana/ alcohol, however,  documentation in epic states also did acid/ cocaine/ iv drug use   History of hepatitis C    per gi note due to hx iv drug use (pt denies hx IV drug use) ;   treated in 2012 with resolution per last GI progress office note in epic 06-20-2018   Mixed restrictive and obstructive lung disease (HCC)    pulmologist--- dr Tonia Brooms;   no oxygen   Moderate persistent asthma    followed by dr Tonia Brooms   Oxygen deficiency    PONV (postoperative nausea and vomiting)    Prostate cancer metastatic to bone [facility]) 03/08/2022   primary urologist-- dr gay/  oncologist--- dr Clelia Croft;   pt dx 07/ 2023 via bone bx  lesion,  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 (left 4th rib/ L4)    SURGICAL HISTORY: Past Surgical History:  Procedure Laterality Date   COLONOSCOPY     COLONOSCOPY WITH PROPOFOL N/A 12/11/2014   Procedure: COLONOSCOPY WITH PROPOFOL;  Surgeon: Louis Meckel, MD;  Location: [facility] ENDOSCOPY;  Service: Endoscopy;  Laterality: N/A;   ESOPHAGEAL BANDING N/A 12/11/2014   Procedure: ESOPHAGEAL BANDING;  Surgeon: Louis Meckel, MD;  Location: [facility] ENDOSCOPY;  Service:  Endoscopy;  Laterality: N/A;   ESOPHAGOGASTRODUODENOSCOPY N/A 06/06/2017   Procedure: ESOPHAGOGASTRODUODENOSCOPY (EGD);  Surgeon: Benancio Deeds, MD;  Location: [facility] ENDOSCOPY;  Service: Gastroenterology;  Laterality: N/A;   ESOPHAGOGASTRODUODENOSCOPY (EGD) WITH PROPOFOL N/A 12/11/2014   Procedure: ESOPHAGOGASTRODUODENOSCOPY (EGD) WITH PROPOFOL;  Surgeon: Louis Meckel, MD;  Location: [facility] ENDOSCOPY;  Service: Endoscopy;  Laterality: N/A;   GOLD SEED IMPLANT N/A 05/19/2022   Procedure: GOLD SEED IMPLANT;  Surgeon: Marcine Matar, MD;  Location: [facility];  Service: Urology;  Laterality: N/A;  ONLY NEEDS 30 MIN   KNEE  ARTHROSCOPY Right    1980s   SPACE OAR INSTILLATION N/A 05/19/2022   Procedure: SPACE OAR INSTILLATION;  Surgeon: Marcine Matar, MD;  Location: [facility];  Service: Urology;  Laterality: N/A;   UPPER GASTROINTESTINAL ENDOSCOPY      SOCIAL HISTORY: Social History   Socioeconomic History   Marital status: Divorced    Spouse name: Not on file   Number of children: 0   Years of education: Not on file   Highest education level: Not on file  Occupational History   Occupation: Investment banker, corporate  Tobacco Use   Smoking status: Former    Packs/day: 0.50    Years: 40.00    Additional pack years: 0.00    Total pack years: 20.00    Types: Cigarettes    Quit date: 2015    Years since quitting: 9.3   Smokeless tobacco: Never  Vaping Use   Vaping Use: Never used  Substance and Sexual Activity   Alcohol use: Not Currently    Alcohol/week: 2.0 standard drinks of alcohol    Types: 2 Standard drinks or equivalent per week    Comment: 05-05-2022  per pt last alcohol 07/ 2023, hx abuse   Drug use: Not Currently    Comment: 05-05-2022  per pt last used drugs late 1980s - marijuana, acid (denies cocoaine / or IV drug, however there is documentation in epic )   Sexual activity: Yes  Other Topics Concern   Not on file  Social History Narrative   Not on file   Social Determinants of Health   Financial Resource Strain: Medium Risk (03/14/2022)   Overall Financial Resource Strain (CARDIA)    Difficulty of Paying Living Expenses: Somewhat hard  Food Insecurity: Food Insecurity Present (03/14/2022)   Hunger Vital Sign    Worried About Running Out of Food in the Last Year: Sometimes true    Ran Out of Food in the Last Year: Sometimes true  Transportation Needs: No Transportation Needs (03/14/2022)   PRAPARE - Administrator, Civil Service (Medical): No    Lack of Transportation (Non-Medical): No  Physical Activity: Not on file  Stress: Not on file  Social  Connections: Not on file  Intimate Partner Violence: Not on file    FAMILY HISTORY: Family History  Problem Relation Age of Onset   Diabetes Mother    Alzheimer's disease Mother    Rectal cancer Father    Mesothelioma Father    Diabetes Maternal Grandmother    Colon cancer Neg Hx    Stomach cancer Neg Hx    Esophageal cancer Neg Hx     ALLERGIES:  is allergic to other.  MEDICATIONS:  Current Outpatient Medications  Medication Sig Dispense Refill   albuterol (PROVENTIL) (2.5 MG/3ML) 0.083% nebulizer solution Take 3 mLs (2.5 mg total) by nebulization every 6 (six) hours as needed for wheezing or shortness of breath. 75  mL 12   enzalutamide (XTANDI) 40 MG tablet Take 4 tablets (160 mg total) by mouth daily. 120 tablet 0   meloxicam (MOBIC) 15 MG tablet Take 1 tablet (15 mg) by mouth daily. 30 tablet 1   Oxycodone HCl 10 MG TABS Take 1 tablet (10 mg total) by mouth every 6 (six) hours as needed. 90 tablet 0   Budeson-Glycopyrrol-Formoterol (BREZTRI AEROSPHERE) 160-9-4.8 MCG/ACT AERO Inhale 2 puffs into the lungs in the morning and at bedtime. (Patient not taking: Reported on 12/27/2022) 10.7 g 6   cyclobenzaprine (FLEXERIL) 10 MG tablet Take 1 tablet (10 mg total) by mouth 2 (two) times daily between meals as needed for muscle spasms. 30 tablet 0   tamsulosin (FLOMAX) 0.4 MG CAPS capsule Take 1 capsule by mouth at bedtime (Patient not taking: Reported on 12/27/2022) 90 capsule 3   No current facility-administered medications for this visit.    REVIEW OF SYSTEMS:   Constitutional: ( - ) fevers, ( - )  chills , ( - ) night sweats Eyes: ( - ) blurriness of vision, ( - ) double vision, ( - ) watery eyes Ears, nose, mouth, throat, and face: ( - ) mucositis, ( - ) sore throat Respiratory: ( - ) cough, ( - ) dyspnea, ( - ) wheezes Cardiovascular: ( - ) palpitation, ( - ) chest discomfort, ( - ) lower extremity swelling Gastrointestinal:  ( - ) nausea, ( - ) heartburn, ( - ) change in bowel  habits Skin: ( - ) abnormal skin rashes Lymphatics: ( - ) new lymphadenopathy, ( - ) easy bruising Neurological: ( - ) numbness, ( - ) tingling, ( - ) new weaknesses Behavioral/Psych: ( - ) mood change, ( - ) new changes  All other systems were reviewed with the patient and are negative.  PHYSICAL EXAMINATION: Vitals:   12/27/22 0937  BP: (!) 146/91  Pulse: 77  Resp: 18  Temp: 97.6 F (36.4 C)  SpO2: 100%   Filed Weights   12/27/22 0937  Weight: 182 lb 9.6 oz (82.8 kg)    GENERAL: well appearing middle aged [place] American male, alert, no distress and comfortable SKIN: skin color, texture, turgor are normal, no rashes or significant lesions EYES: conjunctiva are pink and non-injected, sclera clear LUNGS: clear to auscultation and percussion with normal breathing effort Musculoskeletal: no cyanosis of digits and no clubbing. No tenderness to palpation of left ribs or lumbar vertebrae.  PSYCH: alert & oriented x 3, fluent speech NEURO: no focal motor/sensory deficits  LABORATORY DATA:  I have reviewed the data as listed    Latest Ref Rng & Units 12/27/2022    9:19 AM 10/11/2022   10:56 AM 07/06/2022    2:35 PM  CBC  WBC 4.0 - 10.5 K/uL 2.8  3.0  2.3   Hemoglobin 13.0 - 17.0 g/dL 65.7  84.6  96.2   Hematocrit 39.0 - 52.0 % 38.9  38.3  37.1   Platelets 150 - 400 K/uL 149  175  108        Latest Ref Rng & Units 12/27/2022    9:19 AM 10/11/2022   10:56 AM 07/06/2022    2:35 PM  CMP  Glucose 70 - 99 mg/dL 952  841  324   BUN 8 - 23 mg/dL 12  13  10    Creatinine 0.61 - 1.24 mg/dL 4.01  0.27  2.53   Sodium 135 - 145 mmol/L 141  142  144   Potassium 3.5 -  5.1 mmol/L 3.9  3.7  4.1   Chloride 98 - 111 mmol/L 107  104  105   CO2 22 - 32 mmol/L 27  31  28    Calcium 8.9 - 10.3 mg/dL 9.7  9.7  9.4   Total Protein 6.5 - 8.1 g/dL 7.4  7.7  7.4   Total Bilirubin 0.3 - 1.2 mg/dL 0.7  1.0  0.8   Alkaline Phos 38 - 126 U/L 83  71  56   AST 15 - 41 U/L 18  18  25    ALT 0 - 44 U/L 15   13  22      Lab Results  Component Value Date   MPROTEIN Not Observed 02/11/2022   Lab Results  Component Value Date   KPAFRELGTCHN 23.5 (H) 02/11/2022   LAMBDASER 13.8 02/11/2022   KAPLAMBRATIO 1.70 (H) 02/11/2022    RADIOGRAPHIC STUDIES: No results found.  ASSESSMENT & PLAN Dylan [name] is a 62 y.o. male with medical history significant for [facility] who presents for a follow up visit.  # Metastatic Castrate Resistant Prostate Cancer -- received radiation to the prostate, left 4th rib, L4 vertebral body from 06/07/2022-08/08/2022.  --Currently on Xtandi 160 mg PO daily. Recommend to continue.  --Currently on leuprolide 30 mg q 4 months. This will need to be continued indefinitely. He receives his injection at home through speciality infusion, last dose given last month.  --labs today show white blood cell 2.8, hemoglobin 13.2, MCV 91.5, and platelets 149. Creatinine and LFTs normal. PSA levels pending.  --Due to worsening left rib pain and low back pain, we will obtain repeat CT CAP to evaluate for progressive disease.  -- Return to clinic in 3 months time to continue monitoring   #Left big toe discoloration: --Suspect fungal infection --Referral sent to podiatry  Orders Placed This Encounter  Procedures   CT CHEST ABDOMEN PELVIS W CONTRAST    Standing Status:   Future    Standing Expiration Date:   12/27/2023    Order Specific Question:   If indicated for the ordered procedure, I authorize the administration of contrast media per Radiology protocol    Answer:   Yes    Order Specific Question:   Does the patient have a contrast media/X-ray dye allergy?    Answer:   No    Order Specific Question:   Preferred imaging location?    Answer:   [facility]    Order Specific Question:   If indicated for the ordered procedure, I authorize the administration of oral contrast media per Radiology protocol    Answer:   Yes    All questions were answered. The patient knows to  call the clinic with any problems, questions or concerns.  A total of more than 30 minutes were spent on this encounter with face-to-face time and non-face-to-face time, including preparing to see the patient, ordering tests and/or medications, counseling the patient and coordination of care as outlined above.   [name] PA-C Dept of Hematology and Oncology [facility] Phone: [phone]   12/27/2022 8:28 PM

## 2022-12-30 LAB — PROSTATE-SPECIFIC AG, SERUM (LABCORP): Prostate Specific Ag, Serum: <0.1 ng/mL (ref 0.0–4.0)

## 2023-01-05 ENCOUNTER — Encounter: Payer: Self-pay | Admitting: Hematology and Oncology

## 2023-01-12 ENCOUNTER — Ambulatory Visit (HOSPITAL_COMMUNITY)
Admission: RE | Admit: 2023-01-12 | Discharge: 2023-01-12 | Disposition: A | Discharge status: Home / Self Care | Payer: Commercial Managed Care - HMO | Source: Ambulatory Visit | Attending: Physician Assistant | Admitting: Physician Assistant

## 2023-01-12 ENCOUNTER — Telehealth: Payer: Self-pay | Admitting: *Deleted

## 2023-01-12 ENCOUNTER — Other Ambulatory Visit: Payer: Commercial Managed Care - HMO

## 2023-01-12 DIAGNOSIS — C7951 Secondary malignant neoplasm of bone: Secondary | ICD-10-CM | POA: Insufficient documentation

## 2023-01-12 DIAGNOSIS — C61 Malignant neoplasm of prostate: Secondary | ICD-10-CM | POA: Insufficient documentation

## 2023-01-12 MED ORDER — SODIUM CHLORIDE (PF) 0.9 % IJ SOLN
INTRAMUSCULAR | Status: AC
  Filled 2023-01-12: qty 50

## 2023-01-12 MED ORDER — IOHEXOL 300 MG/ML  SOLN
100.0000 mL | Freq: Once | INTRAMUSCULAR | Status: AC | PRN
  Administered 2023-01-12: 100 mL via INTRAVENOUS

## 2023-01-12 NOTE — Telephone Encounter (Signed)
TCT patient regarding his appt here tomorrow. Spoke with him. Advised that we will not have his CT scan results available tomorrow. Scan was done today, 01/12/23. Asked pt if it would be agreeable to him to have Dr. Leonides Schanz call him once the results are available. He was agreeable to this. Appt for tomorrow is cancelled. He is aware of his next appt in July 2024.

## 2023-01-13 ENCOUNTER — Inpatient Hospital Stay: Payer: Commercial Managed Care - HMO | Admitting: Hematology and Oncology

## 2023-01-16 ENCOUNTER — Other Ambulatory Visit: Payer: Commercial Managed Care - HMO

## 2023-01-16 ENCOUNTER — Telehealth: Payer: Self-pay

## 2023-01-16 DIAGNOSIS — R932 Abnormal findings on diagnostic imaging of liver and biliary tract: Secondary | ICD-10-CM

## 2023-01-16 DIAGNOSIS — K7469 Other cirrhosis of liver: Secondary | ICD-10-CM

## 2023-01-16 DIAGNOSIS — Z8546 Personal history of malignant neoplasm of prostate: Secondary | ICD-10-CM

## 2023-01-16 DIAGNOSIS — K769 Liver disease, unspecified: Secondary | ICD-10-CM

## 2023-01-16 NOTE — Telephone Encounter (Signed)
-----   Message from Missy Sabins, RN sent at 10/17/2022  4:41 PM EST ----- Regarding: MRI Liver and AFP lab MR LIVER W WO CONTRAST - liver lesion, cirrhosis, AFP  Need to enter orders

## 2023-01-16 NOTE — Telephone Encounter (Signed)
MRI liver order in epic. Secure staff message sent to radiology scheduling to contact patient to set up appt.   AFP lab order in epic.   MyChart message sent to patient with reminders.

## 2023-01-17 ENCOUNTER — Other Ambulatory Visit: Payer: Self-pay | Admitting: Physician Assistant

## 2023-01-17 DIAGNOSIS — C61 Malignant neoplasm of prostate: Secondary | ICD-10-CM

## 2023-01-18 ENCOUNTER — Other Ambulatory Visit (HOSPITAL_COMMUNITY): Payer: Self-pay

## 2023-01-18 ENCOUNTER — Other Ambulatory Visit: Payer: Self-pay | Admitting: Hematology and Oncology

## 2023-01-18 ENCOUNTER — Other Ambulatory Visit: Payer: Self-pay

## 2023-01-18 DIAGNOSIS — C61 Malignant neoplasm of prostate: Secondary | ICD-10-CM

## 2023-01-18 LAB — AFP TUMOR MARKER: AFP-Tumor Marker: 4.3 ng/mL (ref <6.1)

## 2023-01-18 MED ORDER — ENZALUTAMIDE 40 MG PO TABS
160.0000 mg | ORAL_TABLET | Freq: Every day | ORAL | 0 refills | Status: DC
  Filled 2023-01-18: qty 120, 30d supply, fill #0

## 2023-01-18 MED ORDER — OXYCODONE HCL 10 MG PO TABS
10.0000 mg | ORAL_TABLET | Freq: Four times a day (QID) | ORAL | 0 refills | Status: DC | PRN
  Filled 2023-01-18: qty 90, 23d supply, fill #0

## 2023-01-19 ENCOUNTER — Other Ambulatory Visit (HOSPITAL_COMMUNITY): Payer: Self-pay

## 2023-01-20 NOTE — Telephone Encounter (Signed)
Patient reviewed MyChart message. Last read by Dylan [name] at  4:43 PM on 01/17/2023.  AFP completed on 01/16/23. MRI liver scheduled for Thursday, 01/26/23 at 3 pm.

## 2023-01-26 ENCOUNTER — Ambulatory Visit (HOSPITAL_COMMUNITY)
Admission: RE | Admit: 2023-01-26 | Discharge: 2023-01-26 | Disposition: A | Discharge status: Home / Self Care | Payer: Commercial Managed Care - HMO | Source: Ambulatory Visit | Attending: Gastroenterology | Admitting: Gastroenterology

## 2023-01-26 DIAGNOSIS — K7469 Other cirrhosis of liver: Secondary | ICD-10-CM | POA: Diagnosis present

## 2023-01-26 DIAGNOSIS — R932 Abnormal findings on diagnostic imaging of liver and biliary tract: Secondary | ICD-10-CM | POA: Diagnosis present

## 2023-01-26 DIAGNOSIS — K769 Liver disease, unspecified: Secondary | ICD-10-CM | POA: Diagnosis present

## 2023-01-26 MED ORDER — GADOXETATE DISODIUM 0.25 MMOL/ML IV SOLN
8.0000 mL | Freq: Once | INTRAVENOUS | Status: AC | PRN
  Administered 2023-01-26: 8 mL via INTRAVENOUS

## 2023-01-27 ENCOUNTER — Ambulatory Visit: Payer: Commercial Managed Care - HMO | Admitting: Physician Assistant

## 2023-01-27 ENCOUNTER — Other Ambulatory Visit: Payer: Commercial Managed Care - HMO

## 2023-01-28 ENCOUNTER — Other Ambulatory Visit (HOSPITAL_COMMUNITY): Payer: Self-pay

## 2023-02-12 ENCOUNTER — Other Ambulatory Visit (HOSPITAL_COMMUNITY): Payer: Self-pay

## 2023-02-12 ENCOUNTER — Other Ambulatory Visit: Payer: Self-pay | Admitting: Physician Assistant

## 2023-02-12 DIAGNOSIS — C61 Malignant neoplasm of prostate: Secondary | ICD-10-CM

## 2023-02-13 ENCOUNTER — Other Ambulatory Visit (HOSPITAL_COMMUNITY): Payer: Self-pay

## 2023-02-13 MED ORDER — MELOXICAM 15 MG PO TABS
15.0000 mg | ORAL_TABLET | Freq: Every day | ORAL | 1 refills | Status: DC
  Filled 2023-02-13: qty 30, 10d supply, fill #0
  Filled 2023-02-14: qty 30, 30d supply, fill #0
  Filled 2023-03-13: qty 30, 30d supply, fill #1

## 2023-02-13 MED ORDER — OXYCODONE HCL 10 MG PO TABS
10.0000 mg | ORAL_TABLET | Freq: Four times a day (QID) | ORAL | 0 refills | Status: DC | PRN
  Filled 2023-02-13: qty 90, 23d supply, fill #0

## 2023-02-14 ENCOUNTER — Other Ambulatory Visit (HOSPITAL_COMMUNITY): Payer: Self-pay

## 2023-02-16 ENCOUNTER — Telehealth: Payer: Self-pay | Admitting: *Deleted

## 2023-02-16 NOTE — Telephone Encounter (Signed)
REceived vm message from pt requesting to be seen sooner than his usual 3 month visit in July. He is concerned about the pain in his ribs and back and worries that his "cancer is out of remission". Pt had CT CAP done on 01/14/23.  Unchanged appearance in left 4th rib and L4 vertebral body  Please advise.

## 2023-02-17 NOTE — Telephone Encounter (Signed)
TCT patient with Dr. Derek Mound recommendations. Per Dr. Leonides Schanz" "Please let him know his scan did not show any worsening of the lesions in that area. His PSA is under good control. He did have a small fluid pocket near his rectum, please assure he is not having any pain/bleeding with passing stools. We can certainly schedule him early if he would like"  Pt does ant to come in sooner than July as he is having in his rib area and his back and he is very concerned that something is going on that cannot be seen on the CT scan. Also reminded pt that his PSA.remains <0.1   Advised we can see him 1 week earlier due to pt's time constraints. Scheduling message.

## 2023-02-20 ENCOUNTER — Telehealth: Payer: Self-pay | Admitting: Hematology and Oncology

## 2023-02-22 ENCOUNTER — Other Ambulatory Visit: Payer: Self-pay | Admitting: Hematology and Oncology

## 2023-02-22 ENCOUNTER — Other Ambulatory Visit: Payer: Self-pay

## 2023-02-22 ENCOUNTER — Other Ambulatory Visit (HOSPITAL_COMMUNITY): Payer: Self-pay

## 2023-02-22 DIAGNOSIS — K7469 Other cirrhosis of liver: Secondary | ICD-10-CM

## 2023-02-22 DIAGNOSIS — C7951 Secondary malignant neoplasm of bone: Secondary | ICD-10-CM

## 2023-02-22 MED ORDER — ENZALUTAMIDE 40 MG PO TABS
160.0000 mg | ORAL_TABLET | Freq: Every day | ORAL | 0 refills | Status: DC
Start: 2023-02-22 — End: 2023-03-20
  Filled 2023-02-22 – 2023-02-23 (×2): qty 120, 30d supply, fill #0

## 2023-02-22 NOTE — Telephone Encounter (Signed)
MyChart message to patient to go to the lab one day next week. Order is in

## 2023-02-22 NOTE — Telephone Encounter (Signed)
-----   Message from Cooper Render, CMA sent at 09/27/2022 11:36 AM EST ----- Regarding: due for AFP Patient will be due for AFP in July ( 6 mo recall)

## 2023-02-23 ENCOUNTER — Other Ambulatory Visit (HOSPITAL_COMMUNITY): Payer: Self-pay

## 2023-02-24 ENCOUNTER — Other Ambulatory Visit (HOSPITAL_COMMUNITY): Payer: Self-pay

## 2023-03-03 ENCOUNTER — Encounter: Payer: Self-pay | Admitting: Gastroenterology

## 2023-03-03 ENCOUNTER — Other Ambulatory Visit (HOSPITAL_COMMUNITY): Payer: Self-pay

## 2023-03-03 ENCOUNTER — Other Ambulatory Visit: Payer: Self-pay | Admitting: Hematology and Oncology

## 2023-03-03 DIAGNOSIS — C61 Malignant neoplasm of prostate: Secondary | ICD-10-CM

## 2023-03-03 MED ORDER — OXYCODONE HCL 10 MG PO TABS
10.0000 mg | ORAL_TABLET | Freq: Four times a day (QID) | ORAL | 0 refills | Status: DC | PRN
Start: 2023-03-03 — End: 2023-03-23
  Filled 2023-03-03 – 2023-03-05 (×2): qty 90, 23d supply, fill #0

## 2023-03-06 ENCOUNTER — Other Ambulatory Visit (HOSPITAL_COMMUNITY): Payer: Self-pay

## 2023-03-06 ENCOUNTER — Other Ambulatory Visit: Payer: Self-pay

## 2023-03-18 ENCOUNTER — Other Ambulatory Visit (HOSPITAL_COMMUNITY): Payer: Self-pay

## 2023-03-20 ENCOUNTER — Other Ambulatory Visit: Payer: Self-pay | Admitting: Hematology and Oncology

## 2023-03-20 ENCOUNTER — Other Ambulatory Visit: Payer: Self-pay

## 2023-03-20 ENCOUNTER — Other Ambulatory Visit (HOSPITAL_COMMUNITY): Payer: Self-pay

## 2023-03-20 DIAGNOSIS — C7951 Secondary malignant neoplasm of bone: Secondary | ICD-10-CM

## 2023-03-21 ENCOUNTER — Other Ambulatory Visit: Payer: Self-pay

## 2023-03-21 ENCOUNTER — Other Ambulatory Visit (HOSPITAL_COMMUNITY): Payer: Self-pay

## 2023-03-21 MED ORDER — ENZALUTAMIDE 40 MG PO TABS
160.0000 mg | ORAL_TABLET | Freq: Every day | ORAL | 0 refills | Status: DC
Start: 2023-03-21 — End: 2023-04-20
  Filled 2023-03-21: qty 120, 30d supply, fill #0

## 2023-03-23 ENCOUNTER — Inpatient Hospital Stay: Payer: Commercial Managed Care - HMO | Attending: Hematology and Oncology

## 2023-03-23 ENCOUNTER — Other Ambulatory Visit (HOSPITAL_COMMUNITY): Payer: Self-pay

## 2023-03-23 ENCOUNTER — Other Ambulatory Visit: Payer: Self-pay | Admitting: Hematology and Oncology

## 2023-03-23 ENCOUNTER — Encounter: Payer: Self-pay | Admitting: Hematology and Oncology

## 2023-03-23 ENCOUNTER — Other Ambulatory Visit: Payer: Self-pay

## 2023-03-23 ENCOUNTER — Inpatient Hospital Stay (HOSPITAL_BASED_OUTPATIENT_CLINIC_OR_DEPARTMENT_OTHER): Payer: Commercial Managed Care - HMO | Admitting: Hematology and Oncology

## 2023-03-23 VITALS — BP 144/91 | HR 78 | Temp 97.5°F | Resp 14 | Wt 184.3 lb

## 2023-03-23 DIAGNOSIS — C61 Malignant neoplasm of prostate: Secondary | ICD-10-CM | POA: Diagnosis not present

## 2023-03-23 DIAGNOSIS — C7951 Secondary malignant neoplasm of bone: Secondary | ICD-10-CM | POA: Insufficient documentation

## 2023-03-23 DIAGNOSIS — G8929 Other chronic pain: Secondary | ICD-10-CM | POA: Diagnosis not present

## 2023-03-23 LAB — CBC WITH DIFFERENTIAL (CANCER CENTER ONLY)
Abs Immature Granulocytes: 0.01 10*3/uL (ref 0.00–0.07)
Basophils Absolute: 0 10*3/uL (ref 0.0–0.1)
Basophils Relative: 1 %
Eosinophils Absolute: 0.4 10*3/uL (ref 0.0–0.5)
Eosinophils Relative: 13 %
HCT: 38.5 % — ABNORMAL LOW (ref 39.0–52.0)
Hemoglobin: 13.2 g/dL (ref 13.0–17.0)
Immature Granulocytes: 0 %
Lymphocytes Relative: 31 %
Lymphs Abs: 0.9 10*3/uL (ref 0.7–4.0)
MCH: 31.7 pg (ref 26.0–34.0)
MCHC: 34.3 g/dL (ref 30.0–36.0)
MCV: 92.3 fL (ref 80.0–100.0)
Monocytes Absolute: 0.5 10*3/uL (ref 0.1–1.0)
Monocytes Relative: 18 %
Neutro Abs: 1 10*3/uL — ABNORMAL LOW (ref 1.7–7.7)
Neutrophils Relative %: 37 %
Platelet Count: 153 10*3/uL (ref 150–400)
RBC: 4.17 MIL/uL — ABNORMAL LOW (ref 4.22–5.81)
RDW: 13.3 % (ref 11.5–15.5)
WBC Count: 2.9 10*3/uL — ABNORMAL LOW (ref 4.0–10.5)
nRBC: 0 % (ref 0.0–0.2)

## 2023-03-23 LAB — CMP (CANCER CENTER ONLY)
ALT: 15 U/L (ref 0–44)
AST: 19 U/L (ref 15–41)
Albumin: 4.2 g/dL (ref 3.5–5.0)
Alkaline Phosphatase: 91 U/L (ref 38–126)
Anion gap: 5 (ref 5–15)
BUN: 13 mg/dL (ref 8–23)
CO2: 31 mmol/L (ref 22–32)
Calcium: 9.6 mg/dL (ref 8.9–10.3)
Chloride: 106 mmol/L (ref 98–111)
Creatinine: 1.01 mg/dL (ref 0.61–1.24)
GFR, Estimated: >60 mL/min (ref >60)
Glucose, Bld: 105 mg/dL — ABNORMAL HIGH (ref 70–99)
Potassium: 4.2 mmol/L (ref 3.5–5.1)
Sodium: 142 mmol/L (ref 135–145)
Total Bilirubin: 0.5 mg/dL (ref 0.3–1.2)
Total Protein: 7.1 g/dL (ref 6.5–8.1)

## 2023-03-23 MED ORDER — OXYCODONE HCL 10 MG PO TABS
10.0000 mg | ORAL_TABLET | Freq: Four times a day (QID) | ORAL | 0 refills | Status: DC | PRN
Start: 2023-03-23 — End: 2023-04-18
  Filled 2023-03-23 – 2023-03-27 (×2): qty 120, 30d supply, fill #0

## 2023-03-23 MED ORDER — XTAMPZA ER 13.5 MG PO C12A
13.5000 mg | EXTENDED_RELEASE_CAPSULE | Freq: Two times a day (BID) | ORAL | 0 refills | Status: DC
Start: 2023-03-23 — End: 2023-04-18
  Filled 2023-03-23: qty 60, 30d supply, fill #0

## 2023-03-23 NOTE — Progress Notes (Signed)
[facility] [phone]   [phone]  PROGRESS NOTE  Patient Care Team: Jaci Standard, MD as PCP - General (Hematology and Oncology) Cherlyn Cushing, RN as Oncology Nurse Navigator Leonides Schanz, Thereasa Distance, MD as Consulting Physician (Hematology and Oncology)  Hematological/Oncological History # Metastatic Castrate Sensitive Prostate Cancer  03/18/2022: start of Xtandi 160 mg PO daily.  07/06/2022: last visit with Dr. Clelia Croft. Was on Eligard 30 mg q 4 months and Xtandi 160 mg  10/11/2022: establish care with Dr. Leonides Schanz   Interval History:  Dylan [name] 62 y.o. male with medical history significant for [facility] who presents for a follow up visit. The patient's last visit was on 10/11/2022. In the interim since the last visit he has continued on eligard and Xtandi.  On exam today Dylan [name] notes he is having areas of pain that are "flaring up again".  He notes over the last 3 to 4 months these have been worsening.  He reports that there is some pain in his lower back as well as the left side of his ribs.  He reports that he has had to increase his oxycodone on occasion, sometimes taking 2 pills at a time.  He notes the pain is currently about a 7 out of 10.  He reports that even double dosing those pills has not been adequate to control the pain at times.  He reports that he does get up about 3-4 times at night to urinate.  He is concerned about progression of disease.  He notes he is faithfully taking his enzalutamide and is had no missed dosages.  He also continues to get his Lupron shots at home.  He denies any fevers, chills, sweats, nausea, vomiting or diarrhea.  Overall he is willing and able to proceed with treatment at this time.  A full 10 point ROS was otherwise negative.   MEDICAL HISTORY:  Past Medical History:  Diagnosis Date   Benign localized prostatic hyperplasia with lower urinary tract symptoms (LUTS)    followed by dr gay   Centrilobular emphysema  (HCC)    Cholelithiasis    Chronic pain    due to bone mets,  left side rib, back, pelvis   CL (cirrhosis of liver) (HCC)    secondary to hx HCV, alcohol and drug abuse   ED (erectile dysfunction)    Hepatic cirrhosis (HCC)    History of COVID-19 06/2019   covid pneumonia w/ acute respriratory failure , hospital admission   History of drug abuse (HCC)    per pt last used drugs late 1980s stated was only marjiuana/ alcohol, however,  documentation in epic states also did acid/ cocaine/ iv drug use   History of hepatitis C    per gi note due to hx iv drug use (pt denies hx IV drug use) ;   treated in 2012 with resolution per last GI progress office note in epic 06-20-2018   Mixed restrictive and obstructive lung disease (HCC)    pulmologist--- dr Tonia Brooms;   no oxygen   Moderate persistent asthma    followed by dr Tonia Brooms   Oxygen deficiency    PONV (postoperative nausea and vomiting)    Prostate cancer metastatic to bone [facility]) 03/08/2022   primary urologist-- dr gay/  oncologist--- dr Clelia Croft;   pt dx 07/ 2023 via bone bx  lesion,  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 (left 4th rib/ L4)    SURGICAL HISTORY: Past Surgical History:  Procedure  Laterality Date   COLONOSCOPY     COLONOSCOPY WITH PROPOFOL N/A 12/11/2014   Procedure: COLONOSCOPY WITH PROPOFOL;  Surgeon: Louis Meckel, MD;  Location: [facility] ENDOSCOPY;  Service: Endoscopy;  Laterality: N/A;   ESOPHAGEAL BANDING N/A 12/11/2014   Procedure: ESOPHAGEAL BANDING;  Surgeon: Louis Meckel, MD;  Location: [facility] ENDOSCOPY;  Service: Endoscopy;  Laterality: N/A;   ESOPHAGOGASTRODUODENOSCOPY N/A 06/06/2017   Procedure: ESOPHAGOGASTRODUODENOSCOPY (EGD);  Surgeon: Benancio Deeds, MD;  Location: [facility] ENDOSCOPY;  Service: Gastroenterology;  Laterality: N/A;   ESOPHAGOGASTRODUODENOSCOPY (EGD) WITH PROPOFOL N/A 12/11/2014   Procedure: ESOPHAGOGASTRODUODENOSCOPY (EGD) WITH PROPOFOL;  Surgeon: Louis Meckel, MD;  Location: [facility]  ENDOSCOPY;  Service: Endoscopy;  Laterality: N/A;   GOLD SEED IMPLANT N/A 05/19/2022   Procedure: GOLD SEED IMPLANT;  Surgeon: Marcine Matar, MD;  Location: [facility];  Service: Urology;  Laterality: N/A;  ONLY NEEDS 30 MIN   KNEE ARTHROSCOPY Right    1980s   SPACE OAR INSTILLATION N/A 05/19/2022   Procedure: SPACE OAR INSTILLATION;  Surgeon: Marcine Matar, MD;  Location: [facility];  Service: Urology;  Laterality: N/A;   UPPER GASTROINTESTINAL ENDOSCOPY      SOCIAL HISTORY: Social History   Socioeconomic History   Marital status: Divorced    Spouse name: Not on file   Number of children: 0   Years of education: Not on file   Highest education level: Not on file  Occupational History   Occupation: Investment banker, corporate  Tobacco Use   Smoking status: Former    Current packs/day: 0.00    Average packs/day: 0.5 packs/day for 40.0 years (20.0 ttl pk-yrs)    Types: Cigarettes    Start date: 28    Quit date: 2015    Years since quitting: 9.5   Smokeless tobacco: Never  Vaping Use   Vaping status: Never Used  Substance and Sexual Activity   Alcohol use: Not Currently    Alcohol/week: 2.0 standard drinks of alcohol    Types: 2 Standard drinks or equivalent per week    Comment: 05-05-2022  per pt last alcohol 07/ 2023, hx abuse   Drug use: Not Currently    Comment: 05-05-2022  per pt last used drugs late 1980s - marijuana, acid (denies cocoaine / or IV drug, however there is documentation in epic )   Sexual activity: Yes  Other Topics Concern   Not on file  Social History Narrative   Not on file   Social Determinants of Health   Financial Resource Strain: Medium Risk (03/14/2022)   Overall Financial Resource Strain (CARDIA)    Difficulty of Paying Living Expenses: Somewhat hard  Food Insecurity: Food Insecurity Present (03/14/2022)   Hunger Vital Sign    Worried About Running Out of Food in the Last Year: Sometimes true    Ran Out of  Food in the Last Year: Sometimes true  Transportation Needs: No Transportation Needs (03/14/2022)   PRAPARE - Administrator, Civil Service (Medical): No    Lack of Transportation (Non-Medical): No  Physical Activity: Not on file  Stress: Not on file  Social Connections: Not on file  Intimate Partner Violence: Not on file    FAMILY HISTORY: Family History  Problem Relation Age of Onset   Diabetes Mother    Alzheimer's disease Mother    Rectal cancer Father    Mesothelioma Father    Diabetes Maternal Grandmother    Colon cancer Neg Hx    Stomach cancer  Neg Hx    Esophageal cancer Neg Hx     ALLERGIES:  is allergic to other.  MEDICATIONS:  Current Outpatient Medications  Medication Sig Dispense Refill   albuterol (PROVENTIL) (2.5 MG/3ML) 0.083% nebulizer solution Take 3 mLs (2.5 mg total) by nebulization every 6 (six) hours as needed for wheezing or shortness of breath. 75 mL 12   Budeson-Glycopyrrol-Formoterol (BREZTRI AEROSPHERE) 160-9-4.8 MCG/ACT AERO Inhale 2 puffs into the lungs in the morning and at bedtime. (Patient not taking: Reported on 12/27/2022) 10.7 g 6   enzalutamide (XTANDI) 40 MG tablet Take 4 tablets (160 mg total) by mouth daily. 120 tablet 0   meloxicam (MOBIC) 15 MG tablet Take 1 tablet (15 mg) by mouth once daily 30 tablet 1   Oxycodone HCl 10 MG TABS Take 1 tablet (10 mg) by mouth every 6 hours as needed. 90 tablet 0   tamsulosin (FLOMAX) 0.4 MG CAPS capsule Take 1 capsule by mouth at bedtime (Patient not taking: Reported on 12/27/2022) 90 capsule 3   No current facility-administered medications for this visit.    REVIEW OF SYSTEMS:   Constitutional: ( - ) fevers, ( - )  chills , ( - ) night sweats Eyes: ( - ) blurriness of vision, ( - ) double vision, ( - ) watery eyes Ears, nose, mouth, throat, and face: ( - ) mucositis, ( - ) sore throat Respiratory: ( - ) cough, ( - ) dyspnea, ( - ) wheezes Cardiovascular: ( - ) palpitation, ( - ) chest  discomfort, ( - ) lower extremity swelling Gastrointestinal:  ( - ) nausea, ( - ) heartburn, ( - ) change in bowel habits Skin: ( - ) abnormal skin rashes Lymphatics: ( - ) new lymphadenopathy, ( - ) easy bruising Neurological: ( - ) numbness, ( - ) tingling, ( - ) new weaknesses Behavioral/Psych: ( - ) mood change, ( - ) new changes  All other systems were reviewed with the patient and are negative.  PHYSICAL EXAMINATION: There were no vitals filed for this visit.  There were no vitals filed for this visit.   GENERAL: well appearing middle aged [place] American male, alert, no distress and comfortable SKIN: skin color, texture, turgor are normal, no rashes or significant lesions EYES: conjunctiva are pink and non-injected, sclera clear LUNGS: clear to auscultation and percussion with normal breathing effort Musculoskeletal: no cyanosis of digits and no clubbing  PSYCH: alert & oriented x 3, fluent speech NEURO: no focal motor/sensory deficits  LABORATORY DATA:  I have reviewed the data as listed    Latest Ref Rng & Units 12/27/2022    9:19 AM 10/11/2022   10:56 AM 07/06/2022    2:35 PM  CBC  WBC 4.0 - 10.5 K/uL 2.8  3.0  2.3   Hemoglobin 13.0 - 17.0 g/dL 82.9  56.2  13.0   Hematocrit 39.0 - 52.0 % 38.9  38.3  37.1   Platelets 150 - 400 K/uL 149  175  108        Latest Ref Rng & Units 12/27/2022    9:19 AM 10/11/2022   10:56 AM 07/06/2022    2:35 PM  CMP  Glucose 70 - 99 mg/dL 865  784  696   BUN 8 - 23 mg/dL 12  13  10    Creatinine 0.61 - 1.24 mg/dL 2.95  2.84  1.32   Sodium 135 - 145 mmol/L 141  142  144   Potassium 3.5 - 5.1 mmol/L 3.9  3.7  4.1   Chloride 98 - 111 mmol/L 107  104  105   CO2 22 - 32 mmol/L 27  31  28    Calcium 8.9 - 10.3 mg/dL 9.7  9.7  9.4   Total Protein 6.5 - 8.1 g/dL 7.4  7.7  7.4   Total Bilirubin 0.3 - 1.2 mg/dL 0.7  1.0  0.8   Alkaline Phos 38 - 126 U/L 83  71  56   AST 15 - 41 U/L 18  18  25    ALT 0 - 44 U/L 15  13  22      Lab Results   Component Value Date   MPROTEIN Not Observed 02/11/2022   Lab Results  Component Value Date   KPAFRELGTCHN 23.5 (H) 02/11/2022   LAMBDASER 13.8 02/11/2022   KAPLAMBRATIO 1.70 (H) 02/11/2022    RADIOGRAPHIC STUDIES: No results found.  ASSESSMENT & PLAN Dylan [name] 62 y.o. male with medical history significant for [facility] who presents for a follow up visit.  # Metastatic Castrate Resistant Prostate Cancer --continue Xtandi 160 mg PO daily -- continue leuprolide 30 mg q 4 months. This will need to be continued indefinitely.  The patient receives the shot at home. --labs at each visit to include CBC, CMP, Testosterone, and PSA -- last PSA 0.1 on 12/27/2022. Initial PSA 26 -- s/p radiation therapy to bone metastasis and lymph nodes.  --labs today show white blood cell 2.9, Hgb 13.2, MCV 92.3, Plt 153 -- Return to clinic in 3 months time to continue monitoring  # Pain Control --patient currently on Oxycodone 10 mg PO q 6 H PRN --patient notes he is having difficulty controlling his pain, on occasion taking 2 oxycodone -- will prescribe Xtampza 13.5 mg q 12 H and refill oxycodone today -- pain is worsening in left ribs, he is concerned about recurrence of disease. Will order early repeat CT scan.  -- If continued issues with pain control will consult palliative care.   No orders of the defined types were placed in this encounter.   All questions were answered. The patient knows to call the clinic with any problems, questions or concerns.  A total of more than 30 minutes were spent on this encounter with face-to-face time and non-face-to-face time, including preparing to see the patient, ordering tests and/or medications, counseling the patient and coordination of care as outlined above.   Ulysees Barns, MD Department of Hematology/Oncology [facility] Phone: [phone] Pager: [phone] Email: [email or web address]  03/23/2023  12:38 PM

## 2023-03-24 ENCOUNTER — Encounter: Payer: Self-pay | Admitting: Hematology and Oncology

## 2023-03-24 ENCOUNTER — Other Ambulatory Visit (HOSPITAL_COMMUNITY): Payer: Self-pay

## 2023-03-24 ENCOUNTER — Telehealth: Payer: Self-pay | Admitting: Hematology and Oncology

## 2023-03-28 ENCOUNTER — Other Ambulatory Visit: Payer: Self-pay

## 2023-03-28 ENCOUNTER — Other Ambulatory Visit (HOSPITAL_COMMUNITY): Payer: Self-pay

## 2023-03-28 ENCOUNTER — Encounter: Payer: Self-pay | Admitting: Hematology and Oncology

## 2023-03-28 ENCOUNTER — Telehealth: Payer: Self-pay | Admitting: *Deleted

## 2023-03-28 ENCOUNTER — Telehealth: Payer: Self-pay

## 2023-03-28 NOTE — Telephone Encounter (Signed)
-----   Message from Nurse Lanora Manis T sent at 03/27/2023  4:42 PM EDT -----  ----- Message ----- From: Jaci Standard, MD Sent: 03/26/2023   5:52 PM EDT To: Kyra Searles, RN  Please let Dylan [name] know that his PSA levels remain less than 0.1.  At this time I have low suspicion that the pain he is experiencing is being caused by progression of his prostate cancer.  I would recommend we continue with the imaging in order to rule out other possible causes of his pain.  Please assure that he has been called and scheduled for that CT scan ----- Message ----- From: Interface, Lab In [place] Sent: 03/23/2023   3:04 PM EDT To: Jaci Standard, MD

## 2023-03-28 NOTE — Telephone Encounter (Signed)
Notified of message below. Pt is aware of CT scheduled for August 6th.

## 2023-03-28 NOTE — Telephone Encounter (Signed)
Notified Patient by voicemail of prior authorization approval for Xtampza ER 13.5mg  Capsules. Medication is approved through 03/27/2024.

## 2023-03-29 ENCOUNTER — Other Ambulatory Visit: Payer: Commercial Managed Care - HMO

## 2023-03-29 ENCOUNTER — Ambulatory Visit: Payer: Commercial Managed Care - HMO | Admitting: Hematology and Oncology

## 2023-04-03 ENCOUNTER — Encounter: Payer: Self-pay | Admitting: Hematology and Oncology

## 2023-04-03 ENCOUNTER — Other Ambulatory Visit (HOSPITAL_COMMUNITY): Payer: Self-pay

## 2023-04-04 ENCOUNTER — Ambulatory Visit (HOSPITAL_COMMUNITY): Payer: Commercial Managed Care - HMO

## 2023-04-05 ENCOUNTER — Other Ambulatory Visit (HOSPITAL_COMMUNITY): Payer: Self-pay

## 2023-04-09 ENCOUNTER — Ambulatory Visit (HOSPITAL_BASED_OUTPATIENT_CLINIC_OR_DEPARTMENT_OTHER): Admission: RE | Admit: 2023-04-09 | Payer: Commercial Managed Care - HMO | Source: Ambulatory Visit

## 2023-04-10 ENCOUNTER — Other Ambulatory Visit (HOSPITAL_COMMUNITY): Payer: Self-pay

## 2023-04-12 ENCOUNTER — Encounter: Payer: Self-pay | Admitting: Hematology and Oncology

## 2023-04-12 ENCOUNTER — Other Ambulatory Visit (HOSPITAL_COMMUNITY): Payer: Self-pay

## 2023-04-12 NOTE — Telephone Encounter (Signed)
Received call from pt. He states his insurance has denied the request for a CT scan for this month. Advised I would send a message to our authorization department so they can look into this. Pt also states he cannot take the Xtampza as it makes him vomit every time he takes it.  His other  pain med is oxycodone 10 mg tabs. He takes 4 of those a day. He tolerates thosw well but his pain is not as well controlled as he would like.  Please advise

## 2023-04-13 ENCOUNTER — Other Ambulatory Visit (HOSPITAL_COMMUNITY): Payer: Self-pay

## 2023-04-13 MED ORDER — MELOXICAM 15 MG PO TABS
15.0000 mg | ORAL_TABLET | Freq: Every day | ORAL | 1 refills | Status: DC
  Filled 2023-04-13: qty 30, 30d supply, fill #0
  Filled 2023-05-12: qty 30, 30d supply, fill #1

## 2023-04-18 ENCOUNTER — Other Ambulatory Visit (HOSPITAL_COMMUNITY): Payer: Self-pay

## 2023-04-18 ENCOUNTER — Other Ambulatory Visit: Payer: Self-pay | Admitting: Physician Assistant

## 2023-04-18 ENCOUNTER — Telehealth: Payer: Self-pay | Admitting: *Deleted

## 2023-04-18 DIAGNOSIS — C61 Malignant neoplasm of prostate: Secondary | ICD-10-CM

## 2023-04-18 MED ORDER — OXYCODONE HCL 15 MG PO TABS
15.0000 mg | ORAL_TABLET | Freq: Four times a day (QID) | ORAL | 0 refills | Status: DC | PRN
Start: 2023-04-18 — End: 2023-05-19
  Filled 2023-04-18 – 2023-04-25 (×2): qty 120, 30d supply, fill #0

## 2023-04-18 NOTE — Telephone Encounter (Signed)
Patient called - he had called last week to say his current pain medication is not holding him long enough and wanted to know if any decision had been made about changing it. [name], PA and Dr. Leonides Schanz informed of patient's message.  Per Ms. Thayil: P2P scheduled 04/19/23 w/pt's insurer for CT auth. CT scheduled 9/6. She sent new rx for oxycodone IR 15 mg q 6 prn to pharmacy. Contacted patient with this information. Advised him to contact pharmacy regarding when new rx can be filled. Mr. Mcmickle verbalized understanding.

## 2023-04-18 NOTE — Progress Notes (Signed)
Pain not well controlled with oxycodone 10 mg q 6 hours. Patient discontinued Xtampza as it caused vomiting. Increased dose of oxycodone 10 to 15 mg q 6 hours.

## 2023-04-20 ENCOUNTER — Encounter: Payer: Self-pay | Admitting: Hematology and Oncology

## 2023-04-20 ENCOUNTER — Other Ambulatory Visit (HOSPITAL_COMMUNITY): Payer: Self-pay

## 2023-04-20 ENCOUNTER — Other Ambulatory Visit: Payer: Self-pay | Admitting: Hematology and Oncology

## 2023-04-20 ENCOUNTER — Other Ambulatory Visit: Payer: Self-pay

## 2023-04-20 DIAGNOSIS — C61 Malignant neoplasm of prostate: Secondary | ICD-10-CM

## 2023-04-20 MED ORDER — ENZALUTAMIDE 40 MG PO TABS
160.0000 mg | ORAL_TABLET | Freq: Every day | ORAL | 0 refills | Status: DC
Start: 2023-04-20 — End: 2023-05-12
  Filled 2023-04-20: qty 120, 30d supply, fill #0

## 2023-04-21 ENCOUNTER — Encounter: Payer: Self-pay | Admitting: Hematology and Oncology

## 2023-04-21 ENCOUNTER — Other Ambulatory Visit (HOSPITAL_COMMUNITY): Payer: Self-pay

## 2023-04-25 ENCOUNTER — Other Ambulatory Visit (HOSPITAL_COMMUNITY): Payer: Self-pay

## 2023-04-25 ENCOUNTER — Other Ambulatory Visit: Payer: Self-pay

## 2023-04-27 ENCOUNTER — Other Ambulatory Visit: Payer: Self-pay | Admitting: Physician Assistant

## 2023-04-27 DIAGNOSIS — C61 Malignant neoplasm of prostate: Secondary | ICD-10-CM

## 2023-05-05 ENCOUNTER — Other Ambulatory Visit (HOSPITAL_COMMUNITY): Payer: Commercial Managed Care - HMO

## 2023-05-05 ENCOUNTER — Ambulatory Visit (HOSPITAL_COMMUNITY): Payer: Commercial Managed Care - HMO

## 2023-05-12 ENCOUNTER — Other Ambulatory Visit: Payer: Self-pay | Admitting: Hematology and Oncology

## 2023-05-12 ENCOUNTER — Other Ambulatory Visit (HOSPITAL_COMMUNITY): Payer: Self-pay

## 2023-05-12 ENCOUNTER — Encounter: Payer: Self-pay | Admitting: Hematology and Oncology

## 2023-05-12 ENCOUNTER — Other Ambulatory Visit: Payer: Self-pay

## 2023-05-12 DIAGNOSIS — C7951 Secondary malignant neoplasm of bone: Secondary | ICD-10-CM

## 2023-05-12 MED ORDER — ENZALUTAMIDE 40 MG PO TABS
160.0000 mg | ORAL_TABLET | Freq: Every day | ORAL | 0 refills | Status: DC
Start: 2023-05-12 — End: 2023-06-20
  Filled 2023-05-12: qty 120, 30d supply, fill #0

## 2023-05-15 ENCOUNTER — Other Ambulatory Visit (HOSPITAL_COMMUNITY): Payer: Self-pay

## 2023-05-17 ENCOUNTER — Encounter (HOSPITAL_COMMUNITY)
Admission: RE | Admit: 2023-05-17 | Discharge: 2023-05-17 | Disposition: A | Discharge status: Home / Self Care | Payer: Commercial Managed Care - HMO | Source: Ambulatory Visit | Attending: Physician Assistant | Admitting: Physician Assistant

## 2023-05-17 ENCOUNTER — Telehealth: Payer: Self-pay | Admitting: *Deleted

## 2023-05-17 DIAGNOSIS — C7951 Secondary malignant neoplasm of bone: Secondary | ICD-10-CM | POA: Insufficient documentation

## 2023-05-17 DIAGNOSIS — C61 Malignant neoplasm of prostate: Secondary | ICD-10-CM | POA: Diagnosis present

## 2023-05-17 MED ORDER — TECHNETIUM TC 99M MEDRONATE IV KIT
20.0000 | PACK | Freq: Once | INTRAVENOUS | Status: AC | PRN
  Administered 2023-05-17: 19.1 via INTRAVENOUS

## 2023-05-17 NOTE — Telephone Encounter (Signed)
Faxed requested info - most recent OV and labs - to [facility], [employer].Info requested to support Reauthorization of Eligard.  Per Dr. Derek Mound OV note 03/23/23: 'Continue Leuprolide 30 mg IM q 4 months. Patient receives shot at home" Fax confirmation received

## 2023-05-19 ENCOUNTER — Other Ambulatory Visit: Payer: Self-pay | Admitting: Physician Assistant

## 2023-05-22 ENCOUNTER — Other Ambulatory Visit (HOSPITAL_COMMUNITY): Payer: Self-pay

## 2023-05-24 ENCOUNTER — Telehealth: Payer: Self-pay | Admitting: *Deleted

## 2023-05-24 ENCOUNTER — Other Ambulatory Visit (HOSPITAL_COMMUNITY): Payer: Self-pay

## 2023-05-24 ENCOUNTER — Encounter (HOSPITAL_COMMUNITY): Payer: Self-pay | Admitting: Pharmacist

## 2023-05-24 MED ORDER — OXYCODONE HCL 15 MG PO TABS
15.0000 mg | ORAL_TABLET | Freq: Four times a day (QID) | ORAL | 0 refills | Status: DC | PRN
  Filled 2023-05-24: qty 120, 30d supply, fill #0

## 2023-05-24 MED ORDER — GABAPENTIN 300 MG PO CAPS
300.0000 mg | ORAL_CAPSULE | Freq: Every day | ORAL | 3 refills | Status: AC
  Filled 2023-05-24: qty 30, 30d supply, fill #0
  Filled 2023-06-23: qty 30, 30d supply, fill #1
  Filled 2023-07-20: qty 30, 30d supply, fill #2

## 2023-05-24 NOTE — Telephone Encounter (Signed)
TCT patient regarding a refill of his pain medication. Spoke with him. Advised that [name], PA has refilled his oxycodone. Also advised that his bone survey from yesterday was unchanged.  He does not have any new lesions  and the previous lesion on his left 4th rib is unchanged. Pt describes pain as like 1000 needles pricking him all at the same time. Advised that this sounds like a nerve type pain. Advised that I would talk to Dr. Leonides Schanz about this.  Spoke to Dr. Leonides Schanz about Dylan [name] pain. He agreed it could be neuropathic pain and recommends Gabapentin 300 mg at bedtime  TCT patient regarding new medication.  Prescription escribed to [facility] pharmacy

## 2023-05-25 ENCOUNTER — Telehealth: Payer: Self-pay

## 2023-05-25 NOTE — Telephone Encounter (Signed)
Call placed to [facility] regarding authorization for Eligard 30 mg to be delivered to Patient through [facility]. Authorization was approved from 05/25/2023 through 06/02/2024. Patient notified. [facility] notified at [phone]. No other needs or concerns noted at this time.

## 2023-05-26 ENCOUNTER — Other Ambulatory Visit (HOSPITAL_COMMUNITY): Payer: Self-pay

## 2023-05-26 ENCOUNTER — Encounter: Payer: Self-pay | Admitting: Hematology and Oncology

## 2023-06-09 ENCOUNTER — Other Ambulatory Visit (HOSPITAL_COMMUNITY): Payer: Self-pay

## 2023-06-12 ENCOUNTER — Other Ambulatory Visit (HOSPITAL_COMMUNITY): Payer: Self-pay

## 2023-06-12 MED ORDER — MELOXICAM 15 MG PO TABS
15.0000 mg | ORAL_TABLET | Freq: Every day | ORAL | 1 refills | Status: DC
  Filled 2023-06-12: qty 30, 30d supply, fill #0
  Filled 2023-06-22 – 2023-06-26 (×2): qty 30, 30d supply, fill #1

## 2023-06-13 ENCOUNTER — Encounter: Payer: Self-pay | Admitting: Hematology and Oncology

## 2023-06-14 ENCOUNTER — Other Ambulatory Visit: Payer: Self-pay

## 2023-06-14 ENCOUNTER — Other Ambulatory Visit (HOSPITAL_COMMUNITY): Payer: Self-pay

## 2023-06-14 NOTE — Progress Notes (Signed)
Specialty Pharmacy Ongoing Clinical Assessment Note  Dylan [name] is a 62 y.o. male who is being followed by the specialty pharmacy service for RxSp Oncology   Patient's specialty medication(s) reviewed today: No data recorded  Missed doses in the last 4 weeks: 0   Patient/Caregiver did not have any additional questions or concerns.   Therapeutic benefit summary: Patient is achieving benefit   Adverse events/side effects summary: No adverse events/side effects   Patient's therapy is appropriate to: Continue    Goals Addressed             This Visit's Progress    Slow Disease Progression       Patient is on track. Patient will maintain adherence. PSA remains undetectable.          Follow up:  6 months  Otto Herb Specialty Pharmacist

## 2023-06-20 ENCOUNTER — Other Ambulatory Visit: Payer: Self-pay

## 2023-06-20 ENCOUNTER — Other Ambulatory Visit: Payer: Self-pay | Admitting: Hematology and Oncology

## 2023-06-20 ENCOUNTER — Inpatient Hospital Stay: Payer: Commercial Managed Care - HMO | Attending: Hematology and Oncology

## 2023-06-20 ENCOUNTER — Other Ambulatory Visit (HOSPITAL_COMMUNITY): Payer: Self-pay

## 2023-06-20 DIAGNOSIS — C7951 Secondary malignant neoplasm of bone: Secondary | ICD-10-CM | POA: Insufficient documentation

## 2023-06-20 DIAGNOSIS — C61 Malignant neoplasm of prostate: Secondary | ICD-10-CM | POA: Insufficient documentation

## 2023-06-20 LAB — CBC WITH DIFFERENTIAL (CANCER CENTER ONLY)
Abs Immature Granulocytes: 0.01 10*3/uL (ref 0.00–0.07)
Basophils Absolute: 0 10*3/uL (ref 0.0–0.1)
Basophils Relative: 1 %
Eosinophils Absolute: 0.4 10*3/uL (ref 0.0–0.5)
Eosinophils Relative: 10 %
HCT: 41.8 % (ref 39.0–52.0)
Hemoglobin: 14.2 g/dL (ref 13.0–17.0)
Immature Granulocytes: 0 %
Lymphocytes Relative: 29 %
Lymphs Abs: 1.1 10*3/uL (ref 0.7–4.0)
MCH: 31 pg (ref 26.0–34.0)
MCHC: 34 g/dL (ref 30.0–36.0)
MCV: 91.3 fL (ref 80.0–100.0)
Monocytes Absolute: 0.7 10*3/uL (ref 0.1–1.0)
Monocytes Relative: 17 %
Neutro Abs: 1.7 10*3/uL (ref 1.7–7.7)
Neutrophils Relative %: 43 %
Platelet Count: 180 10*3/uL (ref 150–400)
RBC: 4.58 MIL/uL (ref 4.22–5.81)
RDW: 13.2 % (ref 11.5–15.5)
WBC Count: 3.8 10*3/uL — ABNORMAL LOW (ref 4.0–10.5)
nRBC: 0 % (ref 0.0–0.2)

## 2023-06-20 LAB — CMP (CANCER CENTER ONLY)
ALT: 18 U/L (ref 0–44)
AST: 19 U/L (ref 15–41)
Albumin: 4.2 g/dL (ref 3.5–5.0)
Alkaline Phosphatase: 98 U/L (ref 38–126)
Anion gap: 6 (ref 5–15)
BUN: 12 mg/dL (ref 8–23)
CO2: 29 mmol/L (ref 22–32)
Calcium: 9.7 mg/dL (ref 8.9–10.3)
Chloride: 107 mmol/L (ref 98–111)
Creatinine: 1.06 mg/dL (ref 0.61–1.24)
GFR, Estimated: >60 mL/min (ref >60)
Glucose, Bld: 92 mg/dL (ref 70–99)
Potassium: 4.2 mmol/L (ref 3.5–5.1)
Sodium: 142 mmol/L (ref 135–145)
Total Bilirubin: 0.6 mg/dL (ref 0.3–1.2)
Total Protein: 7.6 g/dL (ref 6.5–8.1)

## 2023-06-20 MED ORDER — ENZALUTAMIDE 40 MG PO TABS
160.0000 mg | ORAL_TABLET | Freq: Every day | ORAL | 0 refills | Status: DC
  Filled 2023-06-20 – 2023-06-27 (×4): qty 120, 30d supply, fill #0

## 2023-06-21 ENCOUNTER — Other Ambulatory Visit: Payer: Commercial Managed Care - HMO

## 2023-06-21 ENCOUNTER — Other Ambulatory Visit: Payer: Self-pay | Admitting: Hematology and Oncology

## 2023-06-21 ENCOUNTER — Other Ambulatory Visit (HOSPITAL_COMMUNITY): Payer: Self-pay

## 2023-06-21 ENCOUNTER — Encounter: Payer: Self-pay | Admitting: Hematology and Oncology

## 2023-06-21 ENCOUNTER — Inpatient Hospital Stay: Payer: Commercial Managed Care - HMO | Admitting: Hematology and Oncology

## 2023-06-21 VITALS — BP 135/87 | HR 99 | Temp 98.2°F | Resp 17 | Wt 185.8 lb

## 2023-06-21 DIAGNOSIS — R0781 Pleurodynia: Secondary | ICD-10-CM | POA: Diagnosis not present

## 2023-06-21 DIAGNOSIS — C7951 Secondary malignant neoplasm of bone: Secondary | ICD-10-CM

## 2023-06-21 DIAGNOSIS — L309 Dermatitis, unspecified: Secondary | ICD-10-CM

## 2023-06-21 DIAGNOSIS — C61 Malignant neoplasm of prostate: Secondary | ICD-10-CM | POA: Diagnosis not present

## 2023-06-21 LAB — TESTOSTERONE: Testosterone: 26 ng/dL — ABNORMAL LOW (ref 264–916)

## 2023-06-21 LAB — PROSTATE-SPECIFIC AG, SERUM (LABCORP): Prostate Specific Ag, Serum: <0.1 ng/mL (ref 0.0–4.0)

## 2023-06-21 MED ORDER — OXYCODONE HCL 15 MG PO TABS
15.0000 mg | ORAL_TABLET | Freq: Four times a day (QID) | ORAL | 0 refills | Status: DC | PRN
  Filled 2023-06-21 – 2023-06-23 (×4): qty 120, 30d supply, fill #0

## 2023-06-21 MED ORDER — SILDENAFIL CITRATE 100 MG PO TABS
100.0000 mg | ORAL_TABLET | Freq: Every day | ORAL | 0 refills | Status: AC | PRN
  Filled 2023-06-21: qty 30, 30d supply, fill #0

## 2023-06-21 NOTE — Progress Notes (Signed)
[facility] Telephone:([phone]) [phone]   [phone]  PROGRESS NOTE  Patient Care Team: Jaci Standard, MD as PCP - General (Hematology and Oncology) Cherlyn Cushing, RN as Oncology Nurse Navigator Leonides Schanz, Thereasa Distance, MD as Consulting Physician (Hematology and Oncology)  Hematological/Oncological History # Metastatic Castrate Sensitive Prostate Cancer  03/18/2022: start of Xtandi 160 mg PO daily.  07/06/2022: last visit with Dr. Clelia Croft. Was on Eligard 30 mg q 4 months and Xtandi 160 mg  10/11/2022: establish care with Dr. Leonides Schanz   Interval History:  Dylan [name] 62 y.o. male with medical history significant for [facility] who presents for a follow up visit. The patient's last visit was on 03/23/2023. In the interim since the last visit he has continued on eligard and Xtandi.  On exam today Dylan [name] notes he has been okay overall in the interim since her last visit.  He continues to have pain in the left rib and reported is no better or worse.  He is apprised of the imaging did not show any worsening lesions or fracture of the rib.  He reports that he is not having pain in his spine only some pressure.  He notes that the increased dose of oxycodone to 15 mg is helping.  He is taking it 3 times a day, once in the morning, once at lunch, and once before bed.  He reports that he is having some difficulties with erectile dysfunction and would like to have intimate relations with a male friend.  He has tried Cialis in the past but given the headache.  He would like to try Viagra and notes that he has tried it before in the past to the 100 mg.  He reports that he is also developed some dry skin on his lower extremities that has been present for several months but is persistently worsening.  He is using lotion.  He reports it is itchy but not painful.  He notes overall his pain is under control.  He is taking his Xtandi medication as prescribed.  He is urinating about 3-4 times per night  but self discontinued his tamsulosin.  He also continues to get his Lupron shots at home with his last shot being last week.Marland Kitchen  He denies any fevers, chills, sweats, nausea, vomiting or diarrhea.  Overall he is willing and able to proceed with treatment at this time.  A full 10 point ROS was otherwise negative.   MEDICAL HISTORY:  Past Medical History:  Diagnosis Date   Benign localized prostatic hyperplasia with lower urinary tract symptoms (LUTS)    followed by dr gay   Centrilobular emphysema (HCC)    Cholelithiasis    Chronic pain    due to bone mets,  left side rib, back, pelvis   CL (cirrhosis of liver) (HCC)    secondary to hx HCV, alcohol and drug abuse   ED (erectile dysfunction)    Hepatic cirrhosis (HCC)    History of COVID-19 06/2019   covid pneumonia w/ acute respriratory failure , hospital admission   History of drug abuse (HCC)    per pt last used drugs late 1980s stated was only marjiuana/ alcohol, however,  documentation in epic states also did acid/ cocaine/ iv drug use   History of hepatitis C    per gi note due to hx iv drug use (pt denies hx IV drug use) ;   treated in 2012 with resolution per last GI progress office note in epic  06-20-2018   Mixed restrictive and obstructive lung disease (HCC)    pulmologist--- dr Tonia Brooms;   no oxygen   Moderate persistent asthma    followed by dr Tonia Brooms   Oxygen deficiency    PONV (postoperative nausea and vomiting)    Prostate cancer metastatic to bone [facility]) 03/08/2022   primary urologist-- dr gay/  oncologist--- dr Clelia Croft;   pt dx 07/ 2023 via bone bx  lesion,  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 (left 4th rib/ L4)    SURGICAL HISTORY: Past Surgical History:  Procedure Laterality Date   COLONOSCOPY     COLONOSCOPY WITH PROPOFOL N/A 12/11/2014   Procedure: COLONOSCOPY WITH PROPOFOL;  Surgeon: Louis Meckel, MD;  Location: [facility] ENDOSCOPY;  Service: Endoscopy;  Laterality: N/A;   ESOPHAGEAL BANDING N/A 12/11/2014    Procedure: ESOPHAGEAL BANDING;  Surgeon: Louis Meckel, MD;  Location: [facility] ENDOSCOPY;  Service: Endoscopy;  Laterality: N/A;   ESOPHAGOGASTRODUODENOSCOPY N/A 06/06/2017   Procedure: ESOPHAGOGASTRODUODENOSCOPY (EGD);  Surgeon: Benancio Deeds, MD;  Location: [facility] ENDOSCOPY;  Service: Gastroenterology;  Laterality: N/A;   ESOPHAGOGASTRODUODENOSCOPY (EGD) WITH PROPOFOL N/A 12/11/2014   Procedure: ESOPHAGOGASTRODUODENOSCOPY (EGD) WITH PROPOFOL;  Surgeon: Louis Meckel, MD;  Location: [facility] ENDOSCOPY;  Service: Endoscopy;  Laterality: N/A;   GOLD SEED IMPLANT N/A 05/19/2022   Procedure: GOLD SEED IMPLANT;  Surgeon: Marcine Matar, MD;  Location: [facility];  Service: Urology;  Laterality: N/A;  ONLY NEEDS 30 MIN   KNEE ARTHROSCOPY Right    1980s   SPACE OAR INSTILLATION N/A 05/19/2022   Procedure: SPACE OAR INSTILLATION;  Surgeon: Marcine Matar, MD;  [facility];  Service: Urology;  Laterality: N/A;   UPPER GASTROINTESTINAL ENDOSCOPY      SOCIAL HISTORY: Social History   Socioeconomic History   Marital status: Divorced    Spouse name: Not on file   Number of children: 0   Years of education: Not on file   Highest education level: Not on file  Occupational History   Occupation: Investment banker, corporate  Tobacco Use   Smoking status: Former    Current packs/day: 0.00    Average packs/day: 0.5 packs/day for 40.0 years (20.0 ttl pk-yrs)    Types: Cigarettes    Start date: 29    Quit date: 2015    Years since quitting: 9.8   Smokeless tobacco: Never  Vaping Use   Vaping status: Never Used  Substance and Sexual Activity   Alcohol use: Not Currently    Alcohol/week: 2.0 standard drinks of alcohol    Types: 2 Standard drinks or equivalent per week    Comment: 05-05-2022  per pt last alcohol 07/ 2023, hx abuse   Drug use: Not Currently    Comment: 05-05-2022  per pt last used drugs late 1980s - marijuana, acid (denies cocoaine / or IV drug,  however there is documentation in epic )   Sexual activity: Yes  Other Topics Concern   Not on file  Social History Narrative   Not on file   Social Determinants of Health   Financial Resource Strain: Medium Risk (03/14/2022)   Overall Financial Resource Strain (CARDIA)    Difficulty of Paying Living Expenses: Somewhat hard  Food Insecurity: Food Insecurity Present (03/14/2022)   Hunger Vital Sign    Worried About Running Out of Food in the Last Year: Sometimes true    Ran Out of Food in the Last Year: Sometimes true  Transportation Needs: No Transportation Needs (03/14/2022)  PRAPARE - Administrator, Civil Service (Medical): No    Lack of Transportation (Non-Medical): No  Physical Activity: Not on file  Stress: Not on file  Social Connections: Not on file  Intimate Partner Violence: Not on file    FAMILY HISTORY: Family History  Problem Relation Age of Onset   Diabetes Mother    Alzheimer's disease Mother    Rectal cancer Father    Mesothelioma Father    Diabetes Maternal Grandmother    Colon cancer Neg Hx    Stomach cancer Neg Hx    Esophageal cancer Neg Hx     ALLERGIES:  is allergic to other.  MEDICATIONS:  Current Outpatient Medications  Medication Sig Dispense Refill   sildenafil (VIAGRA) 100 MG tablet Take 1 tablet (100 mg total) by mouth daily as needed for erectile dysfunction. 30 tablet 0   albuterol (PROVENTIL) (2.5 MG/3ML) 0.083% nebulizer solution Take 3 mLs (2.5 mg total) by nebulization every 6 (six) hours as needed for wheezing or shortness of breath. 75 mL 12   Budeson-Glycopyrrol-Formoterol (BREZTRI AEROSPHERE) 160-9-4.8 MCG/ACT AERO Inhale 2 puffs into the lungs in the morning and at bedtime. (Patient not taking: Reported on 12/27/2022) 10.7 g 6   enzalutamide (XTANDI) 40 MG tablet Take 4 tablets (160 mg total) by mouth daily. 120 tablet 0   gabapentin (NEURONTIN) 300 MG capsule Take 1 capsule (300 mg total) by mouth at bedtime. 30 capsule 3    meloxicam (MOBIC) 15 MG tablet Take 1 tablet (15 mg) by mouth once daily 30 tablet 1   oxyCODONE (ROXICODONE) 15 MG immediate release tablet Take 1 tablet (15 mg total) by mouth every 6 (six) hours as needed. 120 tablet 0   tamsulosin (FLOMAX) 0.4 MG CAPS capsule Take 1 capsule by mouth at bedtime (Patient not taking: Reported on 12/27/2022) 90 capsule 3   No current facility-administered medications for this visit.    REVIEW OF SYSTEMS:   Constitutional: ( - ) fevers, ( - )  chills , ( - ) night sweats Eyes: ( - ) blurriness of vision, ( - ) double vision, ( - ) watery eyes Ears, nose, mouth, throat, and face: ( - ) mucositis, ( - ) sore throat Respiratory: ( - ) cough, ( - ) dyspnea, ( - ) wheezes Cardiovascular: ( - ) palpitation, ( - ) chest discomfort, ( - ) lower extremity swelling Gastrointestinal:  ( - ) nausea, ( - ) heartburn, ( - ) change in bowel habits Skin: ( - ) abnormal skin rashes Lymphatics: ( - ) new lymphadenopathy, ( - ) easy bruising Neurological: ( - ) numbness, ( - ) tingling, ( - ) new weaknesses Behavioral/Psych: ( - ) mood change, ( - ) new changes  All other systems were reviewed with the patient and are negative.  PHYSICAL EXAMINATION: Vitals:   06/21/23 1533  BP: 135/87  Pulse: 99  Resp: 17  Temp: 98.2 F (36.8 C)  SpO2: 96%    Filed Weights   06/21/23 1533  Weight: 185 lb 12.8 oz (84.3 kg)     GENERAL: well appearing middle aged [place] American male, alert, no distress and comfortable SKIN: skin color, texture, turgor are normal, no rashes or significant lesions EYES: conjunctiva are pink and non-injected, sclera clear LUNGS: clear to auscultation and percussion with normal breathing effort Musculoskeletal: no cyanosis of digits and no clubbing  PSYCH: alert & oriented x 3, fluent speech NEURO: no focal motor/sensory deficits  LABORATORY DATA:  I have reviewed the data as listed    Latest Ref Rng & Units 06/20/2023    3:43 PM  03/23/2023    2:52 PM 12/27/2022    9:19 AM  CBC  WBC 4.0 - 10.5 K/uL 3.8  2.9  2.8   Hemoglobin 13.0 - 17.0 g/dL 30.8  65.7  84.6   Hematocrit 39.0 - 52.0 % 41.8  38.5  38.9   Platelets 150 - 400 K/uL 180  153  149        Latest Ref Rng & Units 06/20/2023    3:43 PM 03/23/2023    2:52 PM 12/27/2022    9:19 AM  CMP  Glucose 70 - 99 mg/dL 92  962  952   BUN 8 - 23 mg/dL 12  13  12    Creatinine 0.61 - 1.24 mg/dL 8.41  3.24  4.01   Sodium 135 - 145 mmol/L 142  142  141   Potassium 3.5 - 5.1 mmol/L 4.2  4.2  3.9   Chloride 98 - 111 mmol/L 107  106  107   CO2 22 - 32 mmol/L 29  31  27    Calcium 8.9 - 10.3 mg/dL 9.7  9.6  9.7   Total Protein 6.5 - 8.1 g/dL 7.6  7.1  7.4   Total Bilirubin 0.3 - 1.2 mg/dL 0.6  0.5  0.7   Alkaline Phos 38 - 126 U/L 98  91  83   AST 15 - 41 U/L 19  19  18    ALT 0 - 44 U/L 18  15  15      Lab Results  Component Value Date   MPROTEIN Not Observed 02/11/2022   Lab Results  Component Value Date   KPAFRELGTCHN 23.5 (H) 02/11/2022   LAMBDASER 13.8 02/11/2022   KAPLAMBRATIO 1.70 (H) 02/11/2022    RADIOGRAPHIC STUDIES: No results found.  ASSESSMENT & PLAN Dylan [name] 62 y.o. male with medical history significant for [facility] who presents for a follow up visit.  # Metastatic Castrate Resistant Prostate Cancer --continue Xtandi 160 mg PO daily -- continue leuprolide 30 mg q 4 months. This will need to be continued indefinitely.  The patient receives the shot at home.  He administered his last shot last week. --labs at each visit to include CBC, CMP, Testosterone, and PSA -- last PSA 0.1 on 06/20/2023. Initial PSA 26 -- s/p radiation therapy to bone metastasis and lymph nodes.  --labs today show white blood cell 3.8, Hgb 14.2, MCV 91.3, Plt 180 -- Return to clinic in 3 months time to continue monitoring  # Pain Control --patient currently on Oxycodone 15mg  PO q 6 H PRN --patient notes his pain is manageable and he is taking up to 3 oxycodone per  day -- continue Xtampza 13.5 mg q 12 H  -- pain in left rib is stable on his current pain regimen. -- If continued issues with pain control will consult palliative care.   No orders of the defined types were placed in this encounter.   All questions were answered. The patient knows to call the clinic with any problems, questions or concerns.  A total of more than 30 minutes were spent on this encounter with face-to-face time and non-face-to-face time, including preparing to see the patient, ordering tests and/or medications, counseling the patient and coordination of care as outlined above.   Ulysees Barns, MD Department of Hematology/Oncology [facility] Phone: [phone] Pager: [phone] Email:  [email or web address]  06/21/2023 4:24 PM

## 2023-06-23 ENCOUNTER — Other Ambulatory Visit: Payer: Self-pay

## 2023-06-23 ENCOUNTER — Other Ambulatory Visit (HOSPITAL_COMMUNITY): Payer: Self-pay

## 2023-06-27 ENCOUNTER — Encounter: Payer: Self-pay | Admitting: Hematology and Oncology

## 2023-06-27 ENCOUNTER — Telehealth: Payer: Self-pay | Admitting: Pharmacy Technician

## 2023-06-27 ENCOUNTER — Other Ambulatory Visit (HOSPITAL_COMMUNITY): Payer: Self-pay

## 2023-06-27 ENCOUNTER — Other Ambulatory Visit: Payer: Self-pay

## 2023-06-27 NOTE — Telephone Encounter (Signed)
Oral Oncology Patient Advocate Encounter   Received notification that prior authorization for Dylan [name] is required.   PA submitted on 06/27/23 Submitted via phone to [employer]  Case #[id number] Status is pending     Jinger Neighbors, CPhT-Adv Oncology Pharmacy Patient Advocate [facility] Direct Number: [phone]  Fax: [phone]

## 2023-06-27 NOTE — Progress Notes (Addendum)
Specialty Pharmacy Refill Coordination Note  Dylan [name] is a 62 y.o. male contacted today regarding refills of specialty medication(s) Enzalutamide   Patient requested Dylan [name] at [facility] at [place] date: 06/27/23   Medication will be filled on 06/27/23.   This medication requires a new prior authorization and is currently being processed by the PA team. Specialty pharmacy team will contact patient if any delays arise.

## 2023-06-27 NOTE — Telephone Encounter (Signed)
Oral Oncology Patient Advocate Encounter  Prior Authorization for [name] has been approved.    PA# [id number] Effective dates: 06/27/23 through 06/26/24  Patients co-pay is $0.    Jinger Neighbors, CPhT-Adv Oncology Pharmacy Patient Advocate [facility] Direct Number: [phone]  Fax: [phone]

## 2023-07-10 ENCOUNTER — Telehealth: Payer: Self-pay | Admitting: *Deleted

## 2023-07-10 NOTE — Telephone Encounter (Signed)
Received vm message from pt inquiring about the referral to [facility]. Referral was sent on 06/21/23. TCT [facility]. Was able to leave message concerning this referral, to have them call this patient to schedule a date/time for him to come in. Provided pt name, DOB, and phone #

## 2023-07-12 ENCOUNTER — Other Ambulatory Visit: Payer: Self-pay

## 2023-07-17 ENCOUNTER — Telehealth: Payer: Self-pay

## 2023-07-17 DIAGNOSIS — K769 Liver disease, unspecified: Secondary | ICD-10-CM

## 2023-07-17 NOTE — Telephone Encounter (Signed)
-----   Message from [facility] sent at 02/02/2023  8:08 AM EDT ----- Regarding: MRI liver Patient due for MRI liver for Cirrhosis, follow-up liver lesions in December

## 2023-07-17 NOTE — Telephone Encounter (Signed)
Order placed for MRI Liver. Msg to schedulers and MyChart message to patient

## 2023-07-18 ENCOUNTER — Other Ambulatory Visit: Payer: Self-pay

## 2023-07-18 ENCOUNTER — Other Ambulatory Visit (HOSPITAL_COMMUNITY): Payer: Self-pay

## 2023-07-18 ENCOUNTER — Other Ambulatory Visit: Payer: Self-pay | Admitting: Hematology and Oncology

## 2023-07-18 DIAGNOSIS — C7951 Secondary malignant neoplasm of bone: Secondary | ICD-10-CM

## 2023-07-18 MED ORDER — ENZALUTAMIDE 40 MG PO TABS
160.0000 mg | ORAL_TABLET | Freq: Every day | ORAL | 0 refills | Status: DC
  Filled 2023-07-18: qty 120, 30d supply, fill #0

## 2023-07-18 NOTE — Telephone Encounter (Signed)
The referral to [facility] was denied as "pt does not meet criteria".  They are only scheduling new pt's with lesions concerning for skin cancer and pt's with a history of skin cancer

## 2023-07-18 NOTE — Progress Notes (Signed)
Specialty Pharmacy Refill Coordination Note  Dylan [name] is a 62 y.o. male contacted today regarding refills of specialty medication(s) Enzalutamide   Patient requested [name] at [facility] at [place] date: 07/25/23   Medication will be filled on 07/24/23.   Pending refill request.

## 2023-07-19 ENCOUNTER — Other Ambulatory Visit (HOSPITAL_COMMUNITY): Payer: Self-pay

## 2023-07-19 ENCOUNTER — Other Ambulatory Visit: Payer: Self-pay | Admitting: Hematology and Oncology

## 2023-07-19 MED ORDER — OXYCODONE HCL 15 MG PO TABS
15.0000 mg | ORAL_TABLET | Freq: Four times a day (QID) | ORAL | 0 refills | Status: DC | PRN
  Filled 2023-07-19: qty 120, 30d supply, fill #0

## 2023-07-19 NOTE — Telephone Encounter (Signed)
Referral faxed to [facility].  Confirmation received

## 2023-07-24 ENCOUNTER — Other Ambulatory Visit (HOSPITAL_COMMUNITY): Payer: Self-pay

## 2023-07-25 ENCOUNTER — Ambulatory Visit (HOSPITAL_COMMUNITY)
Admission: RE | Admit: 2023-07-25 | Discharge: 2023-07-25 | Disposition: A | Discharge status: Home / Self Care | Payer: Commercial Managed Care - HMO | Source: Ambulatory Visit | Attending: Gastroenterology | Admitting: Gastroenterology

## 2023-07-25 DIAGNOSIS — K769 Liver disease, unspecified: Secondary | ICD-10-CM | POA: Diagnosis present

## 2023-07-25 MED ORDER — GADOBUTROL 1 MMOL/ML IV SOLN
8.5000 mL | Freq: Once | INTRAVENOUS | Status: AC | PRN
  Administered 2023-07-25: 8.5 mL via INTRAVENOUS

## 2023-07-29 ENCOUNTER — Other Ambulatory Visit (HOSPITAL_COMMUNITY): Payer: Self-pay

## 2023-07-31 ENCOUNTER — Other Ambulatory Visit: Payer: Self-pay | Admitting: *Deleted

## 2023-07-31 DIAGNOSIS — K769 Liver disease, unspecified: Secondary | ICD-10-CM

## 2023-07-31 DIAGNOSIS — R932 Abnormal findings on diagnostic imaging of liver and biliary tract: Secondary | ICD-10-CM

## 2023-08-01 ENCOUNTER — Other Ambulatory Visit: Payer: Commercial Managed Care - HMO

## 2023-08-01 DIAGNOSIS — R932 Abnormal findings on diagnostic imaging of liver and biliary tract: Secondary | ICD-10-CM

## 2023-08-01 DIAGNOSIS — K769 Liver disease, unspecified: Secondary | ICD-10-CM

## 2023-08-02 LAB — AFP TUMOR MARKER: AFP-Tumor Marker: 4.4 ng/mL (ref <6.1)

## 2023-08-03 ENCOUNTER — Other Ambulatory Visit (HOSPITAL_COMMUNITY): Payer: Self-pay

## 2023-08-03 MED ORDER — MELOXICAM 15 MG PO TABS
15.0000 mg | ORAL_TABLET | Freq: Every day | ORAL | 1 refills | Status: DC
  Filled 2023-08-03: qty 30, 30d supply, fill #0
  Filled 2023-09-04: qty 30, 30d supply, fill #1

## 2023-08-15 ENCOUNTER — Other Ambulatory Visit: Payer: Self-pay

## 2023-08-15 ENCOUNTER — Other Ambulatory Visit (HOSPITAL_COMMUNITY): Payer: Self-pay

## 2023-08-15 ENCOUNTER — Other Ambulatory Visit: Payer: Self-pay | Admitting: Hematology and Oncology

## 2023-08-15 DIAGNOSIS — C7951 Secondary malignant neoplasm of bone: Secondary | ICD-10-CM

## 2023-08-15 MED ORDER — ENZALUTAMIDE 40 MG PO TABS
160.0000 mg | ORAL_TABLET | Freq: Every day | ORAL | 0 refills | Status: DC
  Filled 2023-08-15: qty 120, 30d supply, fill #0

## 2023-08-15 NOTE — Progress Notes (Signed)
Specialty Pharmacy Refill Coordination Note  Dylan [name] is a 62 y.o. male contacted today regarding refills of specialty medication(s) Enzalutamide [name])   Patient requested [name] at [facility] at [place] date: 08/18/23   Medication will be filled on 08/17/23, pending refill approval.

## 2023-08-17 ENCOUNTER — Other Ambulatory Visit: Payer: Self-pay | Admitting: Hematology and Oncology

## 2023-08-17 ENCOUNTER — Other Ambulatory Visit: Payer: Self-pay

## 2023-08-18 ENCOUNTER — Other Ambulatory Visit (HOSPITAL_COMMUNITY): Payer: Self-pay

## 2023-08-18 ENCOUNTER — Telehealth: Payer: Self-pay | Admitting: *Deleted

## 2023-08-18 MED ORDER — OXYCODONE HCL 15 MG PO TABS
15.0000 mg | ORAL_TABLET | Freq: Four times a day (QID) | ORAL | 0 refills | Status: DC | PRN
Start: 2023-08-18 — End: 2023-09-18
  Filled 2023-08-18: qty 120, 30d supply, fill #0

## 2023-08-18 NOTE — Telephone Encounter (Signed)
Received vm message from pt requesting refill of his oxycodone. Dr. Leonides Schanz made aware.

## 2023-09-04 ENCOUNTER — Other Ambulatory Visit (HOSPITAL_COMMUNITY): Payer: Self-pay

## 2023-09-04 ENCOUNTER — Other Ambulatory Visit: Payer: Self-pay

## 2023-09-05 IMAGING — CR DG CHEST 2V
2 series · 2 of 2 positions shown · non-contrast
Comparison: Multiple priors including most recent chest radiograph
July 04, 2021

CLINICAL DATA: Chest pain, shortness of breath

EXAM:
CHEST - 2 VIEW

[chest pa]
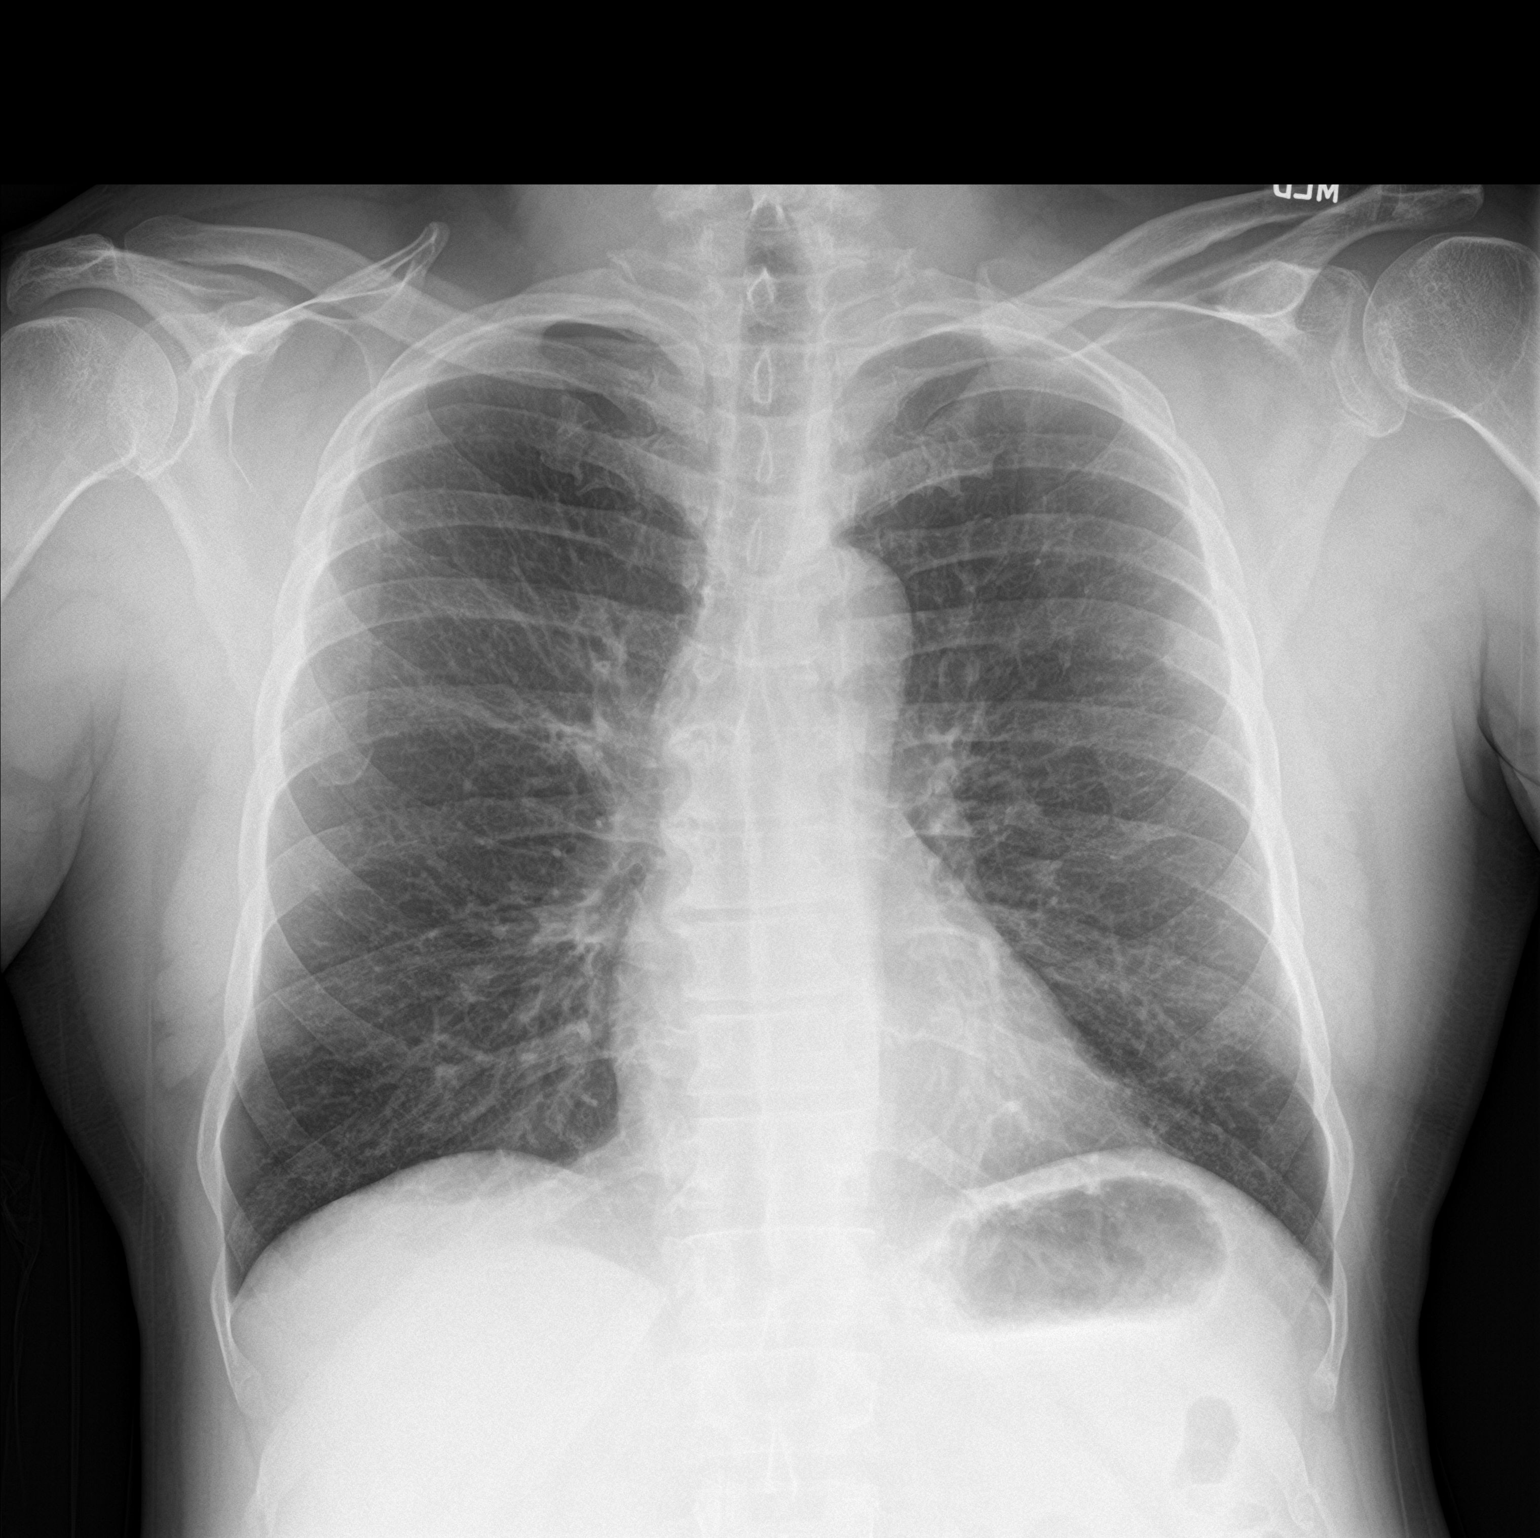

[chest lat]
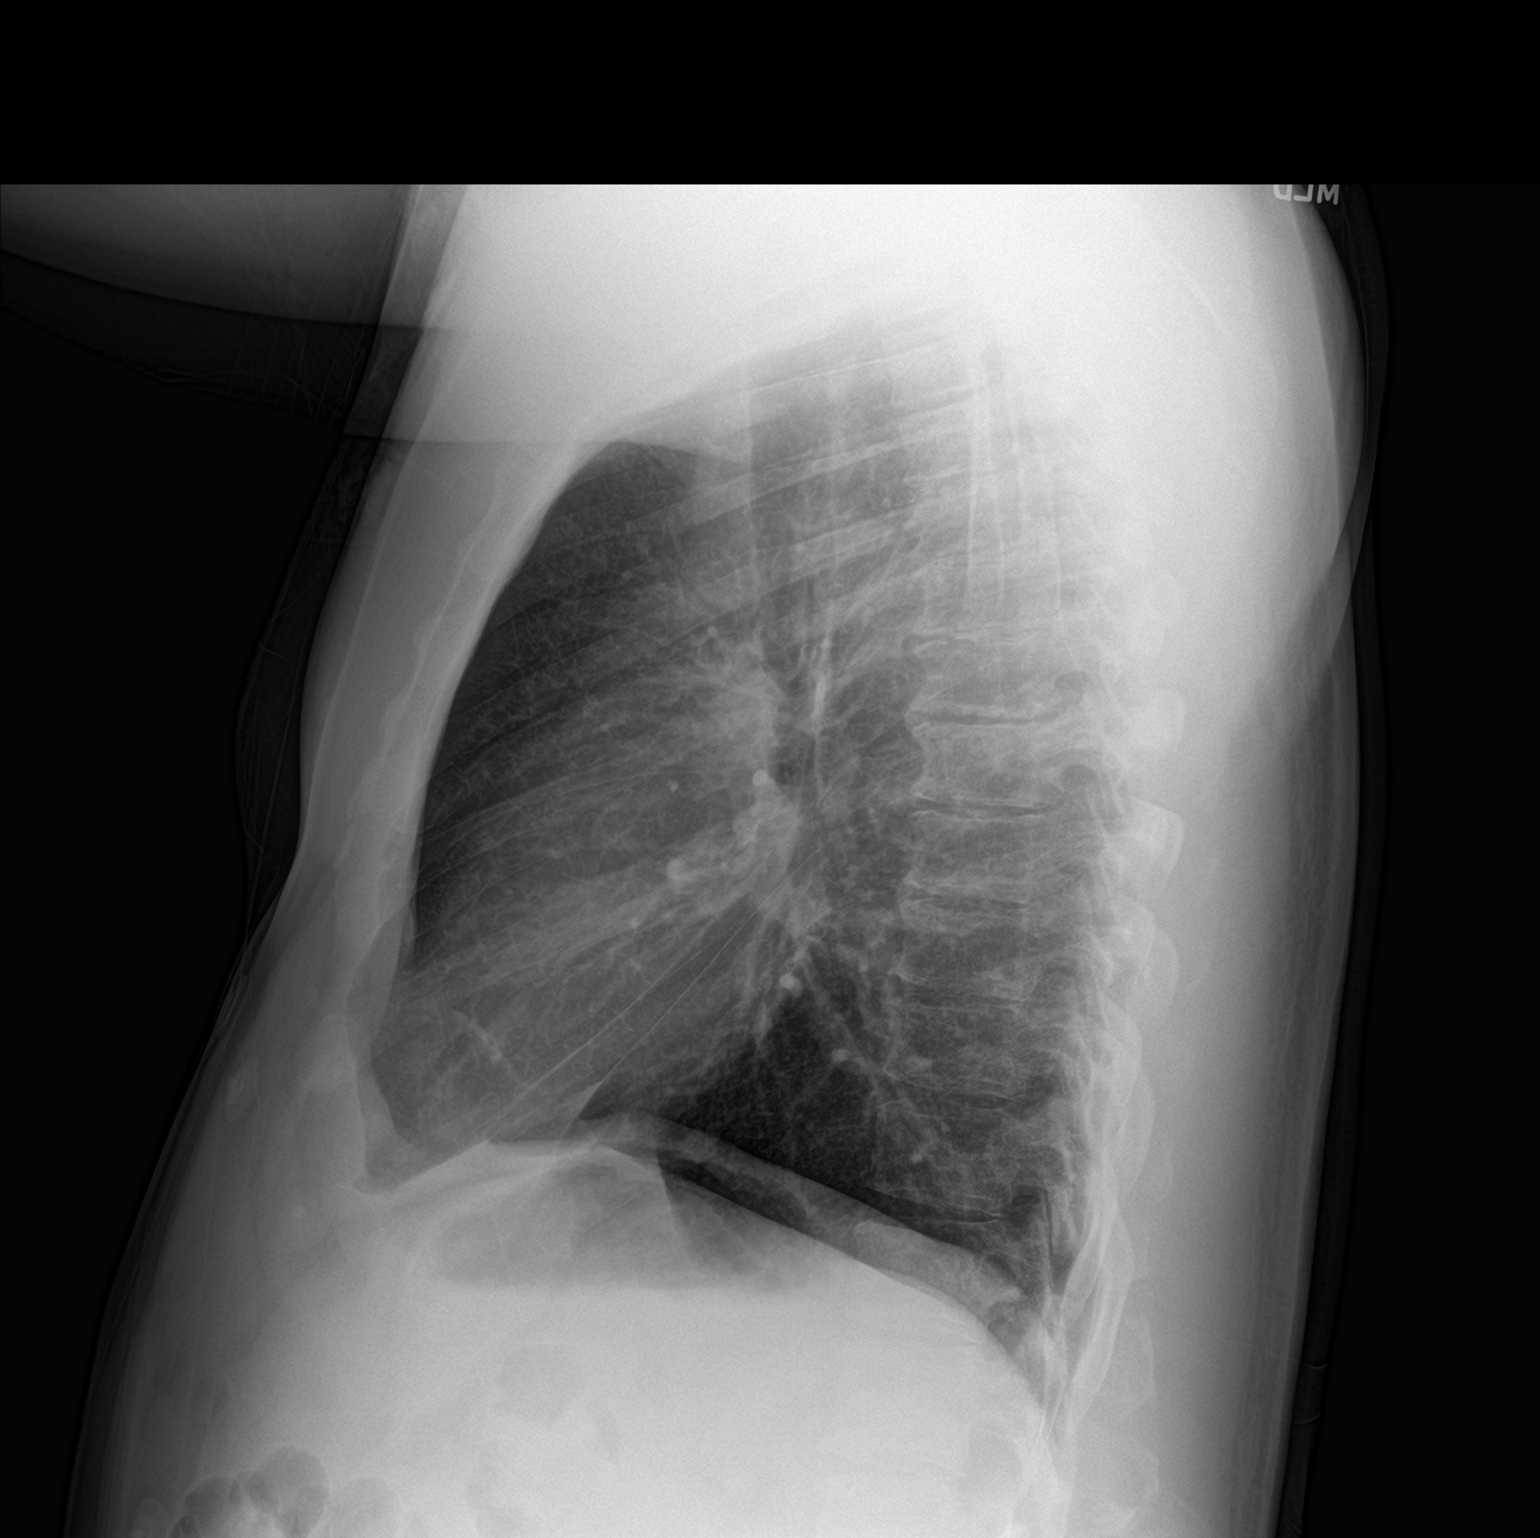

[2 of 2 positions shown; findings below may reference images not displayed]

FINDINGS: The heart size and mediastinal contours are within normal limits.
Mild peribronchial thickening. Nodular opacity in the peripheral
left upper lobe is again seen. The visualized skeletal structures
are unremarkable.
IMPRESSION: 1. Mild peribronchial thickening which may reflect bronchitis in the
appropriate clinical context.
2. Stable nodular opacity in the peripheral left upper lobe. Further
evaluation with nonemergent chest CT without contrast is
recommended.

## 2023-09-07 ENCOUNTER — Telehealth: Payer: Self-pay | Admitting: Physician Assistant

## 2023-09-07 ENCOUNTER — Other Ambulatory Visit (HOSPITAL_COMMUNITY): Payer: Self-pay

## 2023-09-12 ENCOUNTER — Other Ambulatory Visit: Payer: Self-pay

## 2023-09-13 ENCOUNTER — Inpatient Hospital Stay: Payer: Commercial Managed Care - HMO | Attending: Hematology and Oncology

## 2023-09-13 ENCOUNTER — Encounter: Payer: Self-pay | Admitting: Hematology and Oncology

## 2023-09-13 ENCOUNTER — Other Ambulatory Visit: Payer: Self-pay | Admitting: Hematology and Oncology

## 2023-09-13 DIAGNOSIS — C61 Malignant neoplasm of prostate: Secondary | ICD-10-CM | POA: Insufficient documentation

## 2023-09-13 LAB — CMP (CANCER CENTER ONLY)
ALT: 20 U/L (ref 0–44)
AST: 21 U/L (ref 15–41)
Albumin: 4.2 g/dL (ref 3.5–5.0)
Alkaline Phosphatase: 81 U/L (ref 38–126)
Anion gap: 5 (ref 5–15)
BUN: 12 mg/dL (ref 8–23)
CO2: 29 mmol/L (ref 22–32)
Calcium: 9.4 mg/dL (ref 8.9–10.3)
Chloride: 105 mmol/L (ref 98–111)
Creatinine: 1.11 mg/dL (ref 0.61–1.24)
GFR, Estimated: >60 mL/min (ref >60)
Glucose, Bld: 183 mg/dL — ABNORMAL HIGH (ref 70–99)
Potassium: 3.7 mmol/L (ref 3.5–5.1)
Sodium: 139 mmol/L (ref 135–145)
Total Bilirubin: 0.6 mg/dL (ref 0.0–1.2)
Total Protein: 7.4 g/dL (ref 6.5–8.1)

## 2023-09-13 LAB — CBC WITH DIFFERENTIAL (CANCER CENTER ONLY)
Abs Immature Granulocytes: 0 10*3/uL (ref 0.00–0.07)
Basophils Absolute: 0 10*3/uL (ref 0.0–0.1)
Basophils Relative: 1 %
Eosinophils Absolute: 0.4 10*3/uL (ref 0.0–0.5)
Eosinophils Relative: 10 %
HCT: 39.2 % (ref 39.0–52.0)
Hemoglobin: 13.4 g/dL (ref 13.0–17.0)
Immature Granulocytes: 0 %
Lymphocytes Relative: 32 %
Lymphs Abs: 1.2 10*3/uL (ref 0.7–4.0)
MCH: 31 pg (ref 26.0–34.0)
MCHC: 34.2 g/dL (ref 30.0–36.0)
MCV: 90.7 fL (ref 80.0–100.0)
Monocytes Absolute: 0.5 10*3/uL (ref 0.1–1.0)
Monocytes Relative: 12 %
Neutro Abs: 1.7 10*3/uL (ref 1.7–7.7)
Neutrophils Relative %: 45 %
Platelet Count: 155 10*3/uL (ref 150–400)
RBC: 4.32 MIL/uL (ref 4.22–5.81)
RDW: 13.4 % (ref 11.5–15.5)
WBC Count: 3.7 10*3/uL — ABNORMAL LOW (ref 4.0–10.5)
nRBC: 0 % (ref 0.0–0.2)

## 2023-09-14 ENCOUNTER — Encounter: Payer: Self-pay | Admitting: Hematology and Oncology

## 2023-09-14 LAB — PROSTATE-SPECIFIC AG, SERUM (LABCORP): Prostate Specific Ag, Serum: <0.1 ng/mL (ref 0.0–4.0)

## 2023-09-14 LAB — TESTOSTERONE: Testosterone: 37 ng/dL — ABNORMAL LOW (ref 264–916)

## 2023-09-18 ENCOUNTER — Other Ambulatory Visit: Payer: Self-pay | Admitting: Hematology and Oncology

## 2023-09-19 ENCOUNTER — Other Ambulatory Visit (HOSPITAL_COMMUNITY): Payer: Self-pay

## 2023-09-19 MED ORDER — OXYCODONE HCL 15 MG PO TABS
15.0000 mg | ORAL_TABLET | Freq: Four times a day (QID) | ORAL | 0 refills | Status: DC | PRN
  Filled 2023-09-19: qty 120, 30d supply, fill #0

## 2023-09-19 NOTE — Progress Notes (Unsigned)
[facility] Telephone:([phone]   [phone]  PROGRESS NOTE  I connected with Dylan [name]  on 09/19/2022 by telephone visit and verified that I am speaking with the correct person using two identifiers.   I discussed the limitations, risks, security and privacy concerns of performing an evaluation and management service by telemedicine and the availability of in-person appointments. I also discussed with the patient that there may be a patient responsible charge related to this service. The patient expressed understanding and agreed to proceed.  Other persons participating in the visit and their role in the encounter: ***  Patient's location: *** Provider's location: Office   Patient Care Team: Jaci Standard, MD as PCP - General (Hematology and Oncology) Cherlyn Cushing, RN as Oncology Nurse Navigator Leonides Schanz, Thereasa Distance, MD as Consulting Physician (Hematology and Oncology)  Hematological/Oncological History # Metastatic Castrate Sensitive Prostate Cancer  03/18/2022: start of Xtandi 160 mg PO daily.  07/06/2022: last visit with Dr. Clelia Croft. Was on Eligard 30 mg q 4 months and Xtandi 160 mg  10/11/2022: establish care with Dr. Leonides Schanz   Interval History:  Dylan [name] 63 y.o. male with medical history significant for [facility] who presents for a follow up visit. The patient's last visit was on 06/21/2023. In the interim since the last visit he has continued on eligard and Xtandi.  On exam today [name] notes he has been okay overall in the interim since her last visit.  He continues to have pain in the left rib and reported is no better or worse.  He is apprised of the imaging did not show any worsening lesions or fracture of the rib.  He reports that he is not having pain in his spine only some pressure.  He notes that the increased dose of oxycodone to 15 mg is helping.  He is taking it 3 times a day, once in the morning, once at lunch, and once before bed.  He  reports that he is having some difficulties with erectile dysfunction and would like to have intimate relations with a male friend.  He has tried Cialis in the past but given the headache.  He would like to try Viagra and notes that he has tried it before in the past to the 100 mg.  He reports that he is also developed some dry skin on his lower extremities that has been present for several months but is persistently worsening.  He is using lotion.  He reports it is itchy but not painful.  He notes overall his pain is under control.  He is taking his Xtandi medication as prescribed.  He is urinating about 3-4 times per night but self discontinued his tamsulosin.  He also continues to get his Lupron shots at home with his last shot being last week.Marland Kitchen  He denies any fevers, chills, sweats, nausea, vomiting or diarrhea.  Overall he is willing and able to proceed with treatment at this time.  A full 10 point ROS was otherwise negative.   MEDICAL HISTORY:  Past Medical History:  Diagnosis Date   Benign localized prostatic hyperplasia with lower urinary tract symptoms (LUTS)    followed by dr gay   Centrilobular emphysema (HCC)    Cholelithiasis    Chronic pain    due to bone mets,  left side rib, back, pelvis   CL (cirrhosis of liver) (HCC)    secondary to hx HCV, alcohol and drug abuse   ED (erectile  dysfunction)    Hepatic cirrhosis (HCC)    History of COVID-19 06/2019   covid pneumonia w/ acute respriratory failure , hospital admission   History of drug abuse (HCC)    per pt last used drugs late 1980s stated was only marjiuana/ alcohol, however,  documentation in epic states also did acid/ cocaine/ iv drug use   History of hepatitis C    per gi note due to hx iv drug use (pt denies hx IV drug use) ;   treated in 2012 with resolution per last GI progress office note in epic 06-20-2018   Mixed restrictive and obstructive lung disease (HCC)    pulmologist--- dr Tonia Brooms;   no oxygen   Moderate  persistent asthma    followed by dr Tonia Brooms   Oxygen deficiency    PONV (postoperative nausea and vomiting)    Prostate cancer metastatic to bone [facility]) 03/08/2022   primary urologist-- dr gay/  oncologist--- dr Clelia Croft;   pt dx 07/ 2023 via bone bx  lesion,  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 (left 4th rib/ L4)    SURGICAL HISTORY: Past Surgical History:  Procedure Laterality Date   COLONOSCOPY     COLONOSCOPY WITH PROPOFOL N/A 12/11/2014   Procedure: COLONOSCOPY WITH PROPOFOL;  Surgeon: Louis Meckel, MD;  Location: [facility] ENDOSCOPY;  Service: Endoscopy;  Laterality: N/A;   ESOPHAGEAL BANDING N/A 12/11/2014   Procedure: ESOPHAGEAL BANDING;  Surgeon: Louis Meckel, MD;  Location: [facility] ENDOSCOPY;  Service: Endoscopy;  Laterality: N/A;   ESOPHAGOGASTRODUODENOSCOPY N/A 06/06/2017   Procedure: ESOPHAGOGASTRODUODENOSCOPY (EGD);  Surgeon: Benancio Deeds, MD;  Location: [facility] ENDOSCOPY;  Service: Gastroenterology;  Laterality: N/A;   ESOPHAGOGASTRODUODENOSCOPY (EGD) WITH PROPOFOL N/A 12/11/2014   Procedure: ESOPHAGOGASTRODUODENOSCOPY (EGD) WITH PROPOFOL;  Surgeon: Louis Meckel, MD;  Location: [facility] ENDOSCOPY;  Service: Endoscopy;  Laterality: N/A;   GOLD SEED IMPLANT N/A 05/19/2022   Procedure: GOLD SEED IMPLANT;  Surgeon: Marcine Matar, MD;  Location: [facility];  Service: Urology;  Laterality: N/A;  ONLY NEEDS 30 MIN   KNEE ARTHROSCOPY Right    1980s   SPACE OAR INSTILLATION N/A 05/19/2022   Procedure: SPACE OAR INSTILLATION;  Surgeon: Marcine Matar, MD;  Location: [facility];  Service: Urology;  Laterality: N/A;   UPPER GASTROINTESTINAL ENDOSCOPY      SOCIAL HISTORY: Social History   Socioeconomic History   Marital status: Divorced    Spouse name: Not on file   Number of children: 0   Years of education: Not on file   Highest education level: Not on file  Occupational History   Occupation: Investment banker, corporate  Tobacco Use    Smoking status: Former    Current packs/day: 0.00    Average packs/day: 0.5 packs/day for 40.0 years (20.0 ttl pk-yrs)    Types: Cigarettes    Start date: 72    Quit date: 2015    Years since quitting: 10.0   Smokeless tobacco: Never  Vaping Use   Vaping status: Never Used  Substance and Sexual Activity   Alcohol use: Not Currently    Alcohol/week: 2.0 standard drinks of alcohol    Types: 2 Standard drinks or equivalent per week    Comment: 05-05-2022  per pt last alcohol 07/ 2023, hx abuse   Drug use: Not Currently    Comment: 05-05-2022  per pt last used drugs late 1980s - marijuana, acid (denies cocoaine / or IV drug, however there is documentation in epic )  Sexual activity: Yes  Other Topics Concern   Not on file  Social History Narrative   Not on file   Social Drivers of Health   Financial Resource Strain: Medium Risk (03/14/2022)   Overall Financial Resource Strain (CARDIA)    Difficulty of Paying Living Expenses: Somewhat hard  Food Insecurity: Food Insecurity Present (03/14/2022)   Hunger Vital Sign    Worried About Running Out of Food in the Last Year: Sometimes true    Ran Out of Food in the Last Year: Sometimes true  Transportation Needs: No Transportation Needs (03/14/2022)   PRAPARE - Administrator, Civil Service (Medical): No    Lack of Transportation (Non-Medical): No  Physical Activity: Not on file  Stress: Not on file  Social Connections: Not on file  Intimate Partner Violence: Not on file    FAMILY HISTORY: Family History  Problem Relation Age of Onset   Diabetes Mother    Alzheimer's disease Mother    Rectal cancer Father    Mesothelioma Father    Diabetes Maternal Grandmother    Colon cancer Neg Hx    Stomach cancer Neg Hx    Esophageal cancer Neg Hx     ALLERGIES:  is allergic to other.  MEDICATIONS:  Current Outpatient Medications  Medication Sig Dispense Refill   albuterol (PROVENTIL) (2.5 MG/3ML) 0.083% nebulizer  solution Take 3 mLs (2.5 mg total) by nebulization every 6 (six) hours as needed for wheezing or shortness of breath. 75 mL 12   Budeson-Glycopyrrol-Formoterol (BREZTRI AEROSPHERE) 160-9-4.8 MCG/ACT AERO Inhale 2 puffs into the lungs in the morning and at bedtime. (Patient not taking: Reported on 12/27/2022) 10.7 g 6   enzalutamide (XTANDI) 40 MG tablet Take 4 tablets (160 mg total) by mouth daily. 120 tablet 0   gabapentin (NEURONTIN) 300 MG capsule Take 1 capsule (300 mg total) by mouth at bedtime. 30 capsule 3   meloxicam (MOBIC) 15 MG tablet Take 1 tablet (15 mg) by mouth once daily 30 tablet 1   oxyCODONE (ROXICODONE) 15 MG immediate release tablet Take 1 tablet (15 mg total) by mouth every 6 (six) hours as needed. 120 tablet 0   sildenafil (VIAGRA) 100 MG tablet Take 1 tablet (100 mg total) by mouth daily as needed for erectile dysfunction. 30 tablet 0   tamsulosin (FLOMAX) 0.4 MG CAPS capsule Take 1 capsule by mouth at bedtime (Patient not taking: Reported on 12/27/2022) 90 capsule 3   No current facility-administered medications for this visit.    REVIEW OF SYSTEMS:   Constitutional: ( - ) fevers, ( - )  chills , ( - ) night sweats Eyes: ( - ) blurriness of vision, ( - ) double vision, ( - ) watery eyes Ears, nose, mouth, throat, and face: ( - ) mucositis, ( - ) sore throat Respiratory: ( - ) cough, ( - ) dyspnea, ( - ) wheezes Cardiovascular: ( - ) palpitation, ( - ) chest discomfort, ( - ) lower extremity swelling Gastrointestinal:  ( - ) nausea, ( - ) heartburn, ( - ) change in bowel habits Skin: ( - ) abnormal skin rashes Lymphatics: ( - ) new lymphadenopathy, ( - ) easy bruising Neurological: ( - ) numbness, ( - ) tingling, ( - ) new weaknesses Behavioral/Psych: ( - ) mood change, ( - ) new changes  All other systems were reviewed with the patient and are negative.  PHYSICAL EXAMINATION: Not performed due to virtual visit.   LABORATORY DATA:  I have reviewed the data as  listed    Latest Ref Rng & Units 09/13/2023    3:52 PM 06/20/2023    3:43 PM 03/23/2023    2:52 PM  CBC  WBC 4.0 - 10.5 K/uL 3.7  3.8  2.9   Hemoglobin 13.0 - 17.0 g/dL 82.9  56.2  13.0   Hematocrit 39.0 - 52.0 % 39.2  41.8  38.5   Platelets 150 - 400 K/uL 155  180  153        Latest Ref Rng & Units 09/13/2023    3:52 PM 06/20/2023    3:43 PM 03/23/2023    2:52 PM  CMP  Glucose 70 - 99 mg/dL 865  92  784   BUN 8 - 23 mg/dL 12  12  13    Creatinine 0.61 - 1.24 mg/dL 6.96  2.95  2.84   Sodium 135 - 145 mmol/L 139  142  142   Potassium 3.5 - 5.1 mmol/L 3.7  4.2  4.2   Chloride 98 - 111 mmol/L 105  107  106   CO2 22 - 32 mmol/L 29  29  31    Calcium 8.9 - 10.3 mg/dL 9.4  9.7  9.6   Total Protein 6.5 - 8.1 g/dL 7.4  7.6  7.1   Total Bilirubin 0.0 - 1.2 mg/dL 0.6  0.6  0.5   Alkaline Phos 38 - 126 U/L 81  98  91   AST 15 - 41 U/L 21  19  19    ALT 0 - 44 U/L 20  18  15      Lab Results  Component Value Date   MPROTEIN Not Observed 02/11/2022   Lab Results  Component Value Date   KPAFRELGTCHN 23.5 (H) 02/11/2022   LAMBDASER 13.8 02/11/2022   KAPLAMBRATIO 1.70 (H) 02/11/2022    RADIOGRAPHIC STUDIES: No results found.  ASSESSMENT & PLAN JAMARION RIEKER is a 63 y.o. male with medical history significant for [facility] who presents for a follow up visit.  # Metastatic Castrate Resistant Prostate Cancer --continue Xtandi 160 mg PO daily -- continue leuprolide 30 mg q 4 months. This will need to be continued indefinitely.  The patient receives the shot at home.  He administered his last shot last week.*** --labs at each visit to include CBC, CMP, Testosterone, and PSA -- s/p radiation therapy to bone metastasis and lymph nodes.  --labs from 09/13/23 showed WBC 3.7, Hgb 13.4, MCV 90.7, Plt 155. Creatinine and LFTs normal. PSA <0.1.  -- Return to clinic in 3 months time to continue monitoring  # Pain Control --patient currently on Oxycodone 15mg  PO q 6 H PRN --patient notes his pain is  manageable and he is taking up to 3 oxycodone per day -- continue Xtampza 13.5 mg q 12 H  -- pain in left rib is stable on his current pain regimen. -- If continued issues with pain control will consult palliative care.   No orders of the defined types were placed in this encounter.   All questions were answered. The patient knows to call the clinic with any problems, questions or concerns.  A total of more than 25 minutes were spent on this encounter with  non-face-to-face time, including preparing to see the patient, ordering tests and/or medications, counseling the patient and coordination of care as outlined above.   [name] PA-C Dept of Hematology and Oncology [facility] Phone: [phone]   09/19/2023 9:29 PM

## 2023-09-20 ENCOUNTER — Other Ambulatory Visit (HOSPITAL_COMMUNITY): Payer: Self-pay

## 2023-09-20 ENCOUNTER — Ambulatory Visit: Payer: Commercial Managed Care - HMO | Admitting: Hematology and Oncology

## 2023-09-20 ENCOUNTER — Telehealth: Payer: Self-pay

## 2023-09-20 ENCOUNTER — Inpatient Hospital Stay: Payer: Commercial Managed Care - HMO | Admitting: Physician Assistant

## 2023-09-20 ENCOUNTER — Other Ambulatory Visit: Payer: Commercial Managed Care - HMO

## 2023-09-20 DIAGNOSIS — C61 Malignant neoplasm of prostate: Secondary | ICD-10-CM | POA: Diagnosis not present

## 2023-09-20 DIAGNOSIS — C7951 Secondary malignant neoplasm of bone: Secondary | ICD-10-CM

## 2023-09-20 NOTE — Telephone Encounter (Signed)
T/C to pt regarding his appt time for today. Pt said he received the wrong dose for his oxycodone 15 mg that it was not the immediate release.  He has taken six of the 120 and they did not help with his pain plus they were a different color.  Advised pt to take his bottle to the [facility] pharmacy to confirm if medication is incorrect.  He agreed to do this.  Spoke with Tresa Endo at [facility] pharmacy to let her know pt will be bringing in his bottle of meds for clarification.

## 2023-09-22 ENCOUNTER — Ambulatory Visit: Payer: Commercial Managed Care - HMO | Admitting: Gastroenterology

## 2023-09-23 ENCOUNTER — Other Ambulatory Visit: Payer: Self-pay | Admitting: Hematology and Oncology

## 2023-09-23 DIAGNOSIS — C7951 Secondary malignant neoplasm of bone: Secondary | ICD-10-CM

## 2023-09-24 MED ORDER — ENZALUTAMIDE 40 MG PO TABS
160.0000 mg | ORAL_TABLET | Freq: Every day | ORAL | 0 refills | Status: DC
  Filled 2023-09-26 – 2023-11-16 (×3): qty 120, 30d supply, fill #0

## 2023-09-25 ENCOUNTER — Other Ambulatory Visit: Payer: Self-pay

## 2023-09-25 ENCOUNTER — Encounter: Payer: Self-pay | Admitting: Hematology and Oncology

## 2023-09-25 ENCOUNTER — Telehealth: Payer: Self-pay | Admitting: Pharmacy Technician

## 2023-09-25 ENCOUNTER — Other Ambulatory Visit (HOSPITAL_COMMUNITY): Payer: Self-pay

## 2023-09-25 ENCOUNTER — Other Ambulatory Visit: Payer: Self-pay | Admitting: Hematology and Oncology

## 2023-09-25 DIAGNOSIS — C7951 Secondary malignant neoplasm of bone: Secondary | ICD-10-CM

## 2023-09-25 DIAGNOSIS — C61 Malignant neoplasm of prostate: Secondary | ICD-10-CM

## 2023-09-25 MED ORDER — ENZALUTAMIDE 40 MG PO TABS
160.0000 mg | ORAL_TABLET | Freq: Every day | ORAL | 0 refills | Status: DC
  Filled 2023-09-26: qty 120, 30d supply, fill #0

## 2023-09-25 NOTE — Telephone Encounter (Signed)
Oral Oncology Patient Advocate Encounter   Was successful in obtaining a copay card for [facility].  This copay card will make the patients copay $0.  The billing information is as follows and has been shared with [facility]   RxBin: [id number] PCN: [id number] Member ID: [id number] Group ID: [id number]   Jinger Neighbors, CPhT-Adv Oncology Pharmacy Patient Advocate [facility] Direct Number: [phone]  Fax: [phone]

## 2023-09-25 NOTE — Progress Notes (Signed)
Specialty Pharmacy Refill Coordination Note  Dylan [name] is a 63 y.o. male contacted today regarding refills of specialty medication(s) Enzalutamide [name])   Patient requested [name] at [facility] [place] date: 09/26/23   Medication will be filled on 09/26/23, pending refill approval.

## 2023-09-26 ENCOUNTER — Other Ambulatory Visit: Payer: Self-pay

## 2023-09-27 ENCOUNTER — Other Ambulatory Visit (HOSPITAL_COMMUNITY): Payer: Self-pay

## 2023-09-27 MED ORDER — TRIAMCINOLONE ACETONIDE 0.1 % EX OINT
TOPICAL_OINTMENT | CUTANEOUS | 2 refills | Status: AC
  Filled 2023-09-27: qty 454, 30d supply, fill #0
  Filled 2023-11-15: qty 454, 30d supply, fill #1

## 2023-09-28 ENCOUNTER — Other Ambulatory Visit (HOSPITAL_COMMUNITY): Payer: Self-pay

## 2023-09-28 MED ORDER — MELOXICAM 15 MG PO TABS
15.0000 mg | ORAL_TABLET | Freq: Every day | ORAL | 1 refills | Status: DC
  Filled 2023-09-28: qty 30, 30d supply, fill #0
  Filled 2023-10-31: qty 30, 30d supply, fill #1

## 2023-10-10 IMAGING — CT CT CHEST HIGH RESOLUTION
2 of 7 series · 12 of 36 positions shown, 15 images · non-contrast
Comparison: 07/16/2019.

CLINICAL DATA: Lung nodule, history of COVID infection.

EXAM:
CT CHEST WITHOUT CONTRAST
TECHNIQUE: Multidetector CT imaging of the chest was performed following the
standard protocol without intravenous contrast. High resolution
imaging of the lungs, as well as inspiratory and expiratory imaging,
was performed.

[Series 4: chest 2.00 br36 s3 cor soft · coronal · 0.61mm/px · 3 of 151 slices shown]
[im 31/151  lung]
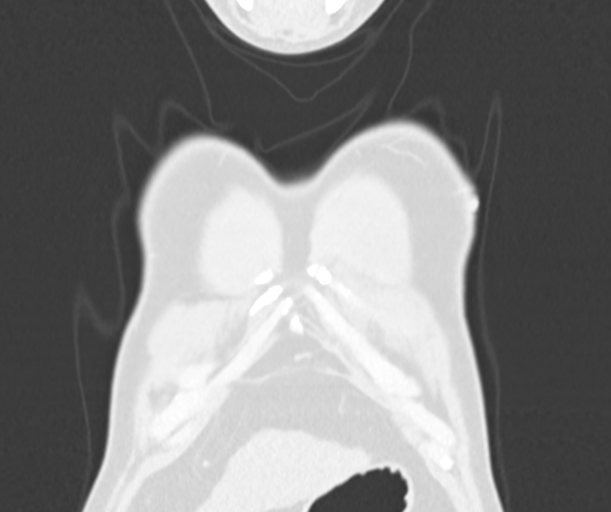
[im 61/151  lung]
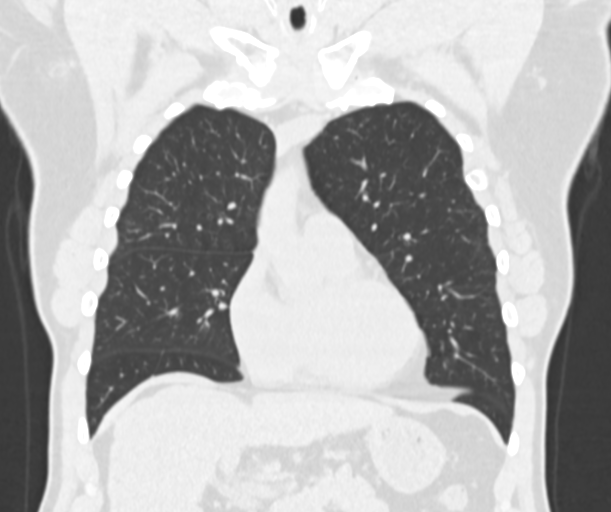
[im 91/151  lung]
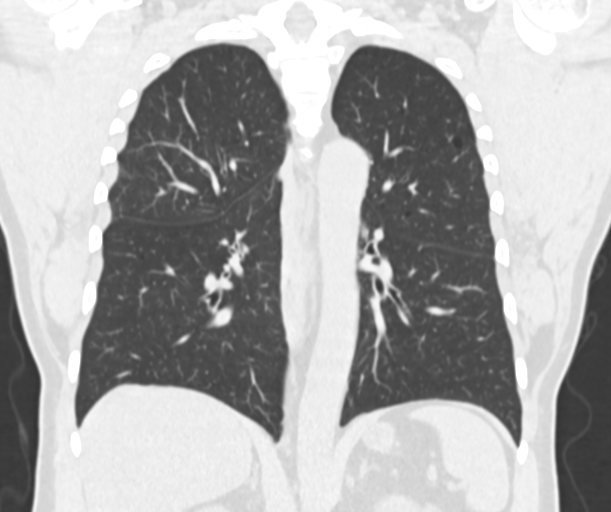

[Series 11: chest 1.00 br60 s3 high res thins 1x1 mm · axial · 0.59mm/px · z∈[+1504,+1753]mm · 9 of 299 slices shown, 12 images]
[im 25/299  mediastinal]
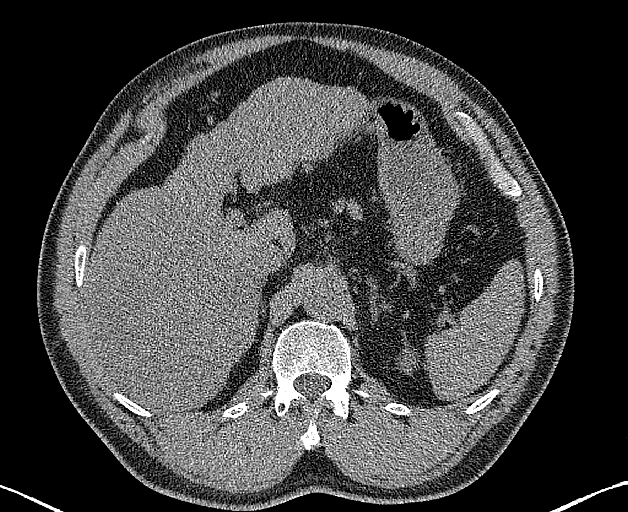
[im 25/299  lung]
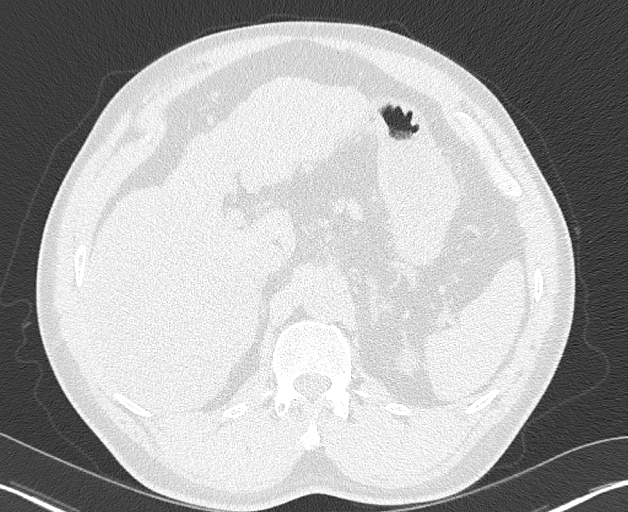
[im 50/299  lung]
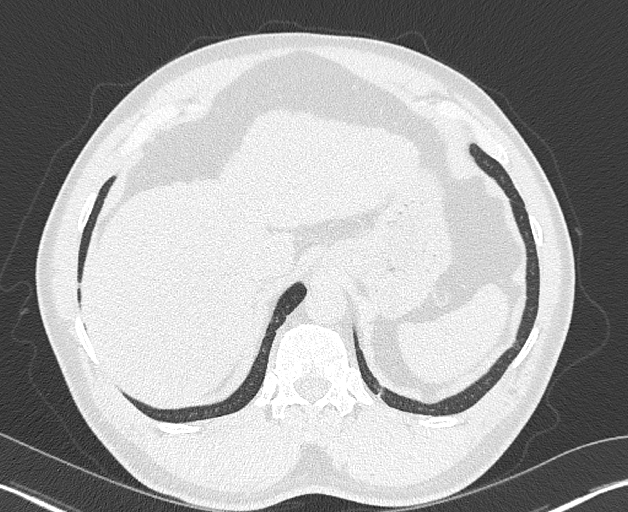
[im 100/299  lung]
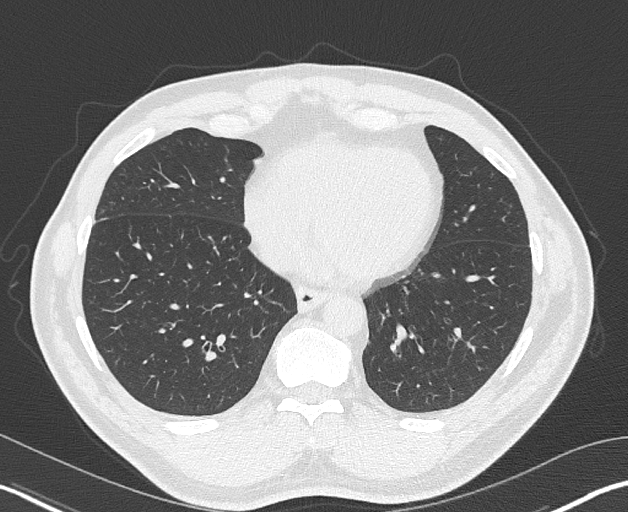
[im 125/299  lung]
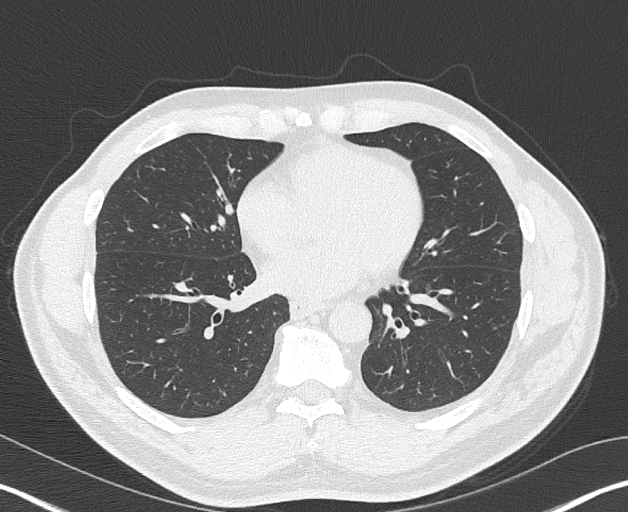
[im 150/299  mediastinal]
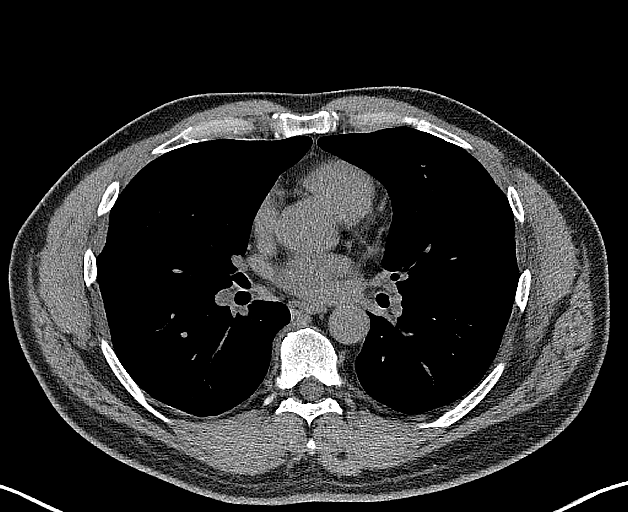
[im 150/299  lung]
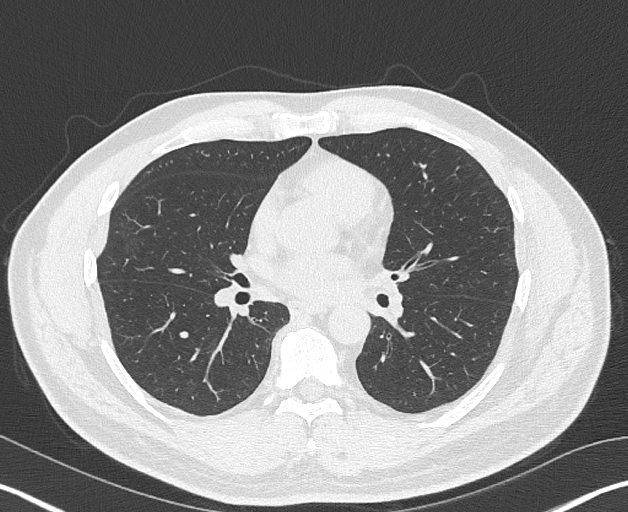
[im 174/299  lung]
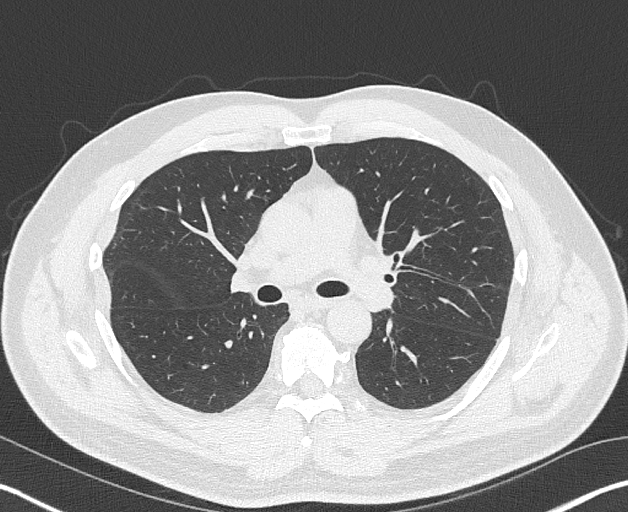
[im 199/299  lung]
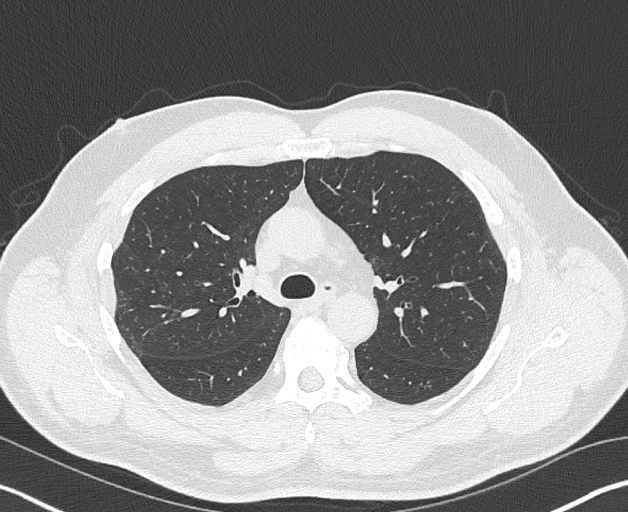
[im 249/299  lung]
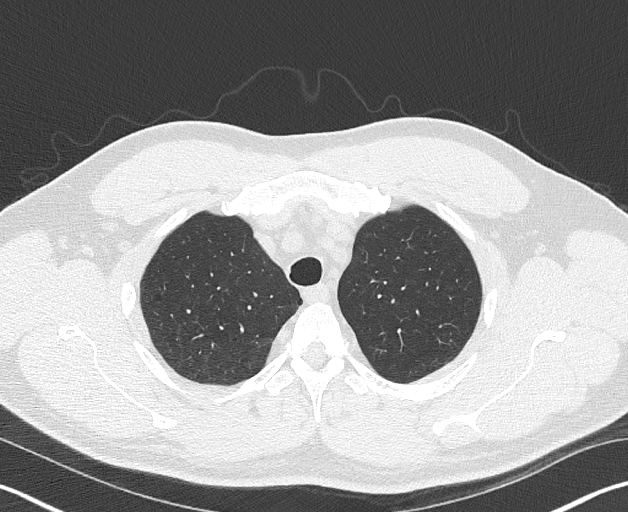
[im 274/299  mediastinal]
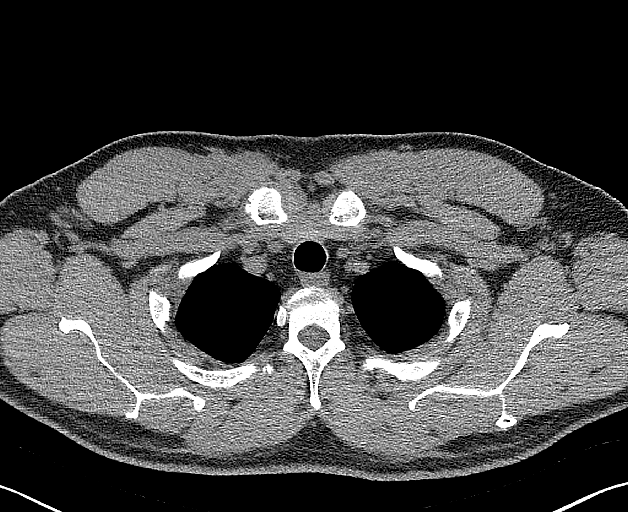
[im 274/299  lung]
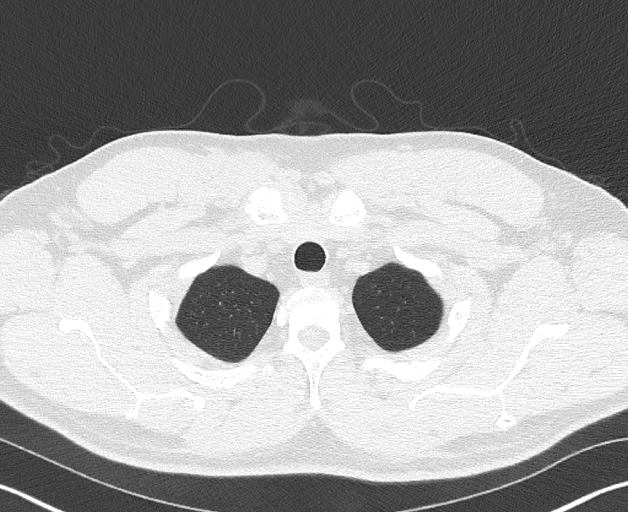

[12 of 36 positions shown; findings below may reference images not displayed]

FINDINGS: Cardiovascular: Heart size normal.  No pericardial effusion.

Mediastinum/Nodes: No pathologically enlarged mediastinal or
axillary lymph nodes. Hilar regions are difficult to evaluate
without IV contrast but appear grossly unremarkable. Esophagus is
grossly unremarkable.

Lungs/Pleura: Mild centrilobular emphysema. Subtle/mild subpleural
ground-glass in the periphery of both upper lobes. Negative for
subpleural reticulation, traction bronchiectasis/bronchiolectasis,
additional ground-glass, architectural distortion or honeycombing.
Scattered mucoid impaction in the left lower lobe. No pleural fluid.
Airway is unremarkable. No air trapping.

Upper Abdomen: Liver margin is markedly irregular and attenuation is
somewhat decreased. Visualized portions of the adrenal glands,
kidneys, spleen, pancreas, stomach and bowel are grossly.

Musculoskeletal: Degenerative changes in the spine. There is an
expansile area of lucency in the lateral left fourth rib (2/52) with
an associated healed fracture, new from 07/16/2019. No additional
areas of lucency in the visualized osseous structures.
IMPRESSION: 1. Subtle/mild subpleural ground-glass in the periphery of both
upper lobes may represent the sequelae of [id number] pneumonia.
Findings are suggestive of an alternative diagnosis (not UIP) per
consensus guidelines: Diagnosis of Idiopathic Pulmonary Fibrosis: An
Official ATS/ERS/JRS/ALAT Clinical Practice Guideline. Am J Respir
Crit Care Med Vol 198, Sonny 5, [id number][DATE].
2. Expansile area of lucency in the lateral left fourth rib with an
associated healed fracture, new from 07/16/2019. Difficult to
exclude a pathologic fracture. Clinical correlation for primary
malignancy should be considered.
3. Cirrhosis.
4.  Emphysema ([id number]).

## 2023-10-12 ENCOUNTER — Encounter: Payer: Self-pay | Admitting: Hematology and Oncology

## 2023-10-12 NOTE — Progress Notes (Unsigned)
Dylan [name]

## 2023-10-13 ENCOUNTER — Ambulatory Visit: Payer: Commercial Managed Care - HMO | Admitting: Gastroenterology

## 2023-10-17 ENCOUNTER — Telehealth: Payer: Self-pay | Admitting: *Deleted

## 2023-10-17 NOTE — Telephone Encounter (Signed)
 Contacted patient in response to vm from patient requesting antibiotic as he developed sniffles, scratchy throat and cough. Dr. Leonides Schanz informed of patient's symptoms. Per Dr. Leonides Schanz - patient should seek care w/PCP or at Urgent Care for evaluation and any testing if needed,  to determine best treatment. Patient contacted and advised of Dr. Derek Mound recommendation. He verbalized understanding.

## 2023-10-18 ENCOUNTER — Encounter: Payer: Self-pay | Admitting: Hematology and Oncology

## 2023-10-18 ENCOUNTER — Other Ambulatory Visit: Payer: Self-pay

## 2023-10-18 ENCOUNTER — Other Ambulatory Visit (HOSPITAL_COMMUNITY): Payer: Self-pay

## 2023-10-18 ENCOUNTER — Ambulatory Visit (HOSPITAL_COMMUNITY)
Admission: EM | Admit: 2023-10-18 | Discharge: 2023-10-18 | Disposition: A | Discharge status: Home / Self Care | Payer: Commercial Managed Care - HMO | Attending: Internal Medicine | Admitting: Internal Medicine

## 2023-10-18 ENCOUNTER — Encounter (HOSPITAL_COMMUNITY): Payer: Self-pay | Admitting: Internal Medicine

## 2023-10-18 DIAGNOSIS — J09X2 Influenza due to identified novel influenza A virus with other respiratory manifestations: Secondary | ICD-10-CM | POA: Diagnosis not present

## 2023-10-18 DIAGNOSIS — J209 Acute bronchitis, unspecified: Secondary | ICD-10-CM | POA: Diagnosis not present

## 2023-10-18 LAB — POCT INFLUENZA A/B
Influenza A, POC: POSITIVE — AB
Influenza B, POC: NEGATIVE

## 2023-10-18 MED ORDER — ALBUTEROL SULFATE (2.5 MG/3ML) 0.083% IN NEBU
INHALATION_SOLUTION | RESPIRATORY_TRACT | Status: AC
  Filled 2023-10-18: qty 3

## 2023-10-18 MED ORDER — ALBUTEROL SULFATE (2.5 MG/3ML) 0.083% IN NEBU
2.5000 mg | INHALATION_SOLUTION | Freq: Once | RESPIRATORY_TRACT | Status: AC
  Administered 2023-10-18: 2.5 mg via RESPIRATORY_TRACT

## 2023-10-18 MED ORDER — AZITHROMYCIN 250 MG PO TABS
ORAL_TABLET | ORAL | 0 refills | Status: AC
  Filled 2023-10-18: qty 6, 5d supply, fill #0

## 2023-10-18 MED ORDER — ALBUTEROL SULFATE HFA 108 (90 BASE) MCG/ACT IN AERS
2.0000 | INHALATION_SPRAY | RESPIRATORY_TRACT | 0 refills | Status: AC | PRN
Start: 2023-10-18 — End: ?
  Filled 2023-10-18: qty 6.7, 17d supply, fill #0

## 2023-10-18 NOTE — Discharge Instructions (Signed)
 Your Flu test is positive for influenza A which you have had for more than 2 days which is the best time to be on the antiviral medication help flu symptoms. Past that it does not help. I am placing you on an antibiotic to cover bronchitis and some pneumonia bacteria, plus the inhaler to help with the cough and wheezing.
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
## 2023-10-18 NOTE — ED Triage Notes (Signed)
 Pt reports for last 3 days he has had a cough ,sore throat,runny nose and has been wheezing.

## 2023-10-19 ENCOUNTER — Other Ambulatory Visit: Payer: Self-pay

## 2023-10-19 ENCOUNTER — Other Ambulatory Visit (HOSPITAL_COMMUNITY): Payer: Self-pay

## 2023-10-19 ENCOUNTER — Other Ambulatory Visit: Payer: Self-pay | Admitting: Physician Assistant

## 2023-10-19 ENCOUNTER — Other Ambulatory Visit: Payer: Self-pay | Admitting: Hematology and Oncology

## 2023-10-19 DIAGNOSIS — C61 Malignant neoplasm of prostate: Secondary | ICD-10-CM

## 2023-10-19 MED ORDER — OXYCODONE HCL 15 MG PO TABS
15.0000 mg | ORAL_TABLET | Freq: Four times a day (QID) | ORAL | 0 refills | Status: DC | PRN
  Filled 2023-10-19: qty 120, 30d supply, fill #0

## 2023-10-19 NOTE — Progress Notes (Signed)
 Specialty Pharmacy Refill Coordination Note  Dylan [name] is a 63 y.o. male contacted today regarding refills of specialty medication(s) Enzalutamide [name])   Patient requested [name] at [facility] at [place] date: 10/27/23   Medication will be filled on 10/26/23, pending refill approval.    This fill date is pending response to refill request from provider. Patient is aware and if they have not received fill by intended date they must follow up with pharmacy.

## 2023-10-20 ENCOUNTER — Other Ambulatory Visit: Payer: Self-pay

## 2023-10-20 ENCOUNTER — Other Ambulatory Visit (HOSPITAL_COMMUNITY): Payer: Self-pay

## 2023-10-20 MED ORDER — ENZALUTAMIDE 40 MG PO TABS
160.0000 mg | ORAL_TABLET | Freq: Every day | ORAL | 0 refills | Status: AC
  Filled 2023-10-20: qty 120, 30d supply, fill #0

## 2023-10-26 ENCOUNTER — Other Ambulatory Visit: Payer: Self-pay

## 2023-10-27 ENCOUNTER — Encounter: Payer: Self-pay | Admitting: Hematology and Oncology

## 2023-11-15 ENCOUNTER — Other Ambulatory Visit: Payer: Self-pay | Admitting: Hematology and Oncology

## 2023-11-16 ENCOUNTER — Other Ambulatory Visit: Payer: Self-pay

## 2023-11-16 ENCOUNTER — Other Ambulatory Visit (HOSPITAL_COMMUNITY): Payer: Self-pay

## 2023-11-16 MED ORDER — OXYCODONE HCL 15 MG PO TABS
15.0000 mg | ORAL_TABLET | Freq: Four times a day (QID) | ORAL | 0 refills | Status: DC | PRN
Start: 2023-11-16 — End: 2023-12-17
  Filled 2023-11-16 – 2023-11-17 (×2): qty 120, 30d supply, fill #0

## 2023-11-16 NOTE — Progress Notes (Signed)
 Specialty Pharmacy Refill Coordination Note  Dylan [name] is a 63 y.o. male contacted today regarding refills of specialty medication(s) Xtandi.  Patient requested (Patient-Rptd) Pickup at [facility] date: (Patient-Rptd) 11/27/23   Medication will be filled on 11/24/23.

## 2023-11-17 ENCOUNTER — Other Ambulatory Visit (HOSPITAL_COMMUNITY): Payer: Self-pay

## 2023-11-17 ENCOUNTER — Other Ambulatory Visit: Payer: Self-pay

## 2023-11-24 ENCOUNTER — Other Ambulatory Visit: Payer: Self-pay

## 2023-12-01 ENCOUNTER — Encounter: Payer: Self-pay | Admitting: Hematology and Oncology

## 2023-12-08 ENCOUNTER — Other Ambulatory Visit (HOSPITAL_COMMUNITY): Payer: Self-pay

## 2023-12-08 ENCOUNTER — Ambulatory Visit: Payer: Commercial Managed Care - HMO | Admitting: Gastroenterology

## 2023-12-08 MED ORDER — MELOXICAM 15 MG PO TABS
15.0000 mg | ORAL_TABLET | Freq: Every day | ORAL | 1 refills | Status: DC
Start: 2023-12-08 — End: 2024-02-06
  Filled 2023-12-08: qty 30, 30d supply, fill #0
  Filled 2024-01-02: qty 30, 30d supply, fill #1

## 2023-12-14 ENCOUNTER — Other Ambulatory Visit: Payer: Self-pay | Admitting: Hematology and Oncology

## 2023-12-14 ENCOUNTER — Inpatient Hospital Stay: Payer: Commercial Managed Care - HMO | Attending: Hematology and Oncology

## 2023-12-14 DIAGNOSIS — C61 Malignant neoplasm of prostate: Secondary | ICD-10-CM | POA: Diagnosis present

## 2023-12-14 DIAGNOSIS — G893 Neoplasm related pain (acute) (chronic): Secondary | ICD-10-CM | POA: Diagnosis not present

## 2023-12-14 DIAGNOSIS — C7951 Secondary malignant neoplasm of bone: Secondary | ICD-10-CM

## 2023-12-14 LAB — CMP (CANCER CENTER ONLY)
ALT: 23 U/L (ref 0–44)
AST: 25 U/L (ref 15–41)
Albumin: 4.2 g/dL (ref 3.5–5.0)
Alkaline Phosphatase: 84 U/L (ref 38–126)
Anion gap: 5 (ref 5–15)
BUN: 15 mg/dL (ref 8–23)
CO2: 27 mmol/L (ref 22–32)
Calcium: 9.3 mg/dL (ref 8.9–10.3)
Chloride: 109 mmol/L (ref 98–111)
Creatinine: 1.09 mg/dL (ref 0.61–1.24)
GFR, Estimated: >60 mL/min (ref >60)
Glucose, Bld: 121 mg/dL — ABNORMAL HIGH (ref 70–99)
Potassium: 4.1 mmol/L (ref 3.5–5.1)
Sodium: 141 mmol/L (ref 135–145)
Total Bilirubin: 0.8 mg/dL (ref 0.0–1.2)
Total Protein: 7.6 g/dL (ref 6.5–8.1)

## 2023-12-14 LAB — CBC WITH DIFFERENTIAL (CANCER CENTER ONLY)
Abs Immature Granulocytes: 0 10*3/uL (ref 0.00–0.07)
Basophils Absolute: 0 10*3/uL (ref 0.0–0.1)
Basophils Relative: 1 %
Eosinophils Absolute: 0.2 10*3/uL (ref 0.0–0.5)
Eosinophils Relative: 6 %
HCT: 38.3 % — ABNORMAL LOW (ref 39.0–52.0)
Hemoglobin: 13.3 g/dL (ref 13.0–17.0)
Immature Granulocytes: 0 %
Lymphocytes Relative: 36 %
Lymphs Abs: 1.2 10*3/uL (ref 0.7–4.0)
MCH: 31.2 pg (ref 26.0–34.0)
MCHC: 34.7 g/dL (ref 30.0–36.0)
MCV: 89.9 fL (ref 80.0–100.0)
Monocytes Absolute: 0.5 10*3/uL (ref 0.1–1.0)
Monocytes Relative: 16 %
Neutro Abs: 1.4 10*3/uL — ABNORMAL LOW (ref 1.7–7.7)
Neutrophils Relative %: 41 %
Platelet Count: 168 10*3/uL (ref 150–400)
RBC: 4.26 MIL/uL (ref 4.22–5.81)
RDW: 13.2 % (ref 11.5–15.5)
WBC Count: 3.4 10*3/uL — ABNORMAL LOW (ref 4.0–10.5)
nRBC: 0 % (ref 0.0–0.2)

## 2023-12-15 LAB — TESTOSTERONE: Testosterone: 38 ng/dL — ABNORMAL LOW (ref 264–916)

## 2023-12-15 LAB — PROSTATE-SPECIFIC AG, SERUM (LABCORP): Prostate Specific Ag, Serum: <0.1 ng/mL (ref 0.0–4.0)

## 2023-12-17 ENCOUNTER — Other Ambulatory Visit: Payer: Self-pay | Admitting: Hematology and Oncology

## 2023-12-18 ENCOUNTER — Other Ambulatory Visit: Payer: Self-pay

## 2023-12-18 ENCOUNTER — Other Ambulatory Visit (HOSPITAL_COMMUNITY): Payer: Self-pay

## 2023-12-18 ENCOUNTER — Other Ambulatory Visit: Payer: Self-pay | Admitting: Hematology and Oncology

## 2023-12-18 DIAGNOSIS — C61 Malignant neoplasm of prostate: Secondary | ICD-10-CM

## 2023-12-18 MED ORDER — OXYCODONE HCL 15 MG PO TABS
15.0000 mg | ORAL_TABLET | Freq: Four times a day (QID) | ORAL | 0 refills | Status: DC | PRN
Start: 2023-12-18 — End: 2024-01-17
  Filled 2023-12-18: qty 120, 30d supply, fill #0

## 2023-12-18 MED ORDER — ENZALUTAMIDE 40 MG PO TABS
160.0000 mg | ORAL_TABLET | Freq: Every day | ORAL | 0 refills | Status: DC
  Filled 2023-12-18: qty 120, 30d supply, fill #0

## 2023-12-18 NOTE — Progress Notes (Signed)
 Specialty Pharmacy Ongoing Clinical Assessment Note  Dylan [name] is a 62 y.o. male who is being followed by the specialty pharmacy service for RxSp Oncology   Patient's specialty medication(s) reviewed today: Enzalutamide  (XTANDI )   Missed doses in the last 4 weeks: 0   Patient/Caregiver did not have any additional questions or concerns.   Therapeutic benefit summary: Patient is achieving benefit   Adverse events/side effects summary: No adverse events/side effects   Patient's therapy is appropriate to: Continue    Goals Addressed             This Visit's Progress    Slow Disease Progression   On track    Patient is on track. Patient will maintain adherence. PSA remains undetectable.          Follow up:  6 months  Malachi Screws Specialty Pharmacist

## 2023-12-18 NOTE — Progress Notes (Signed)
 Specialty Pharmacy Refill Coordination Note  Dylan [name] is a 63 y.o. male contacted today regarding refills of specialty medication(s) Enzalutamide  (XTANDI )   Patient requested Delivery   Delivery date: 12/26/23   Verified address: [address] [name] [address]   Medication will be filled on 12/25/23. This fill date is pending response to refill request from provider. Patient is aware and if they have not received fill by intended date they must follow up with pharmacy.

## 2023-12-20 ENCOUNTER — Telehealth: Payer: Self-pay | Admitting: Genetic Counselor

## 2023-12-20 ENCOUNTER — Telehealth: Payer: Self-pay | Admitting: Hematology and Oncology

## 2023-12-20 NOTE — Telephone Encounter (Signed)
 We messaged Dr. Rosaline Coma on 12/20/2023 to let him know that Mr. Dylan [name] is recommended to have genetic testing due to his personal history of metastatic prostate cancer.   Prentice Sackrider, MS, [facility] Genetic Counselor [email or web address] (P) [phone]

## 2023-12-21 ENCOUNTER — Inpatient Hospital Stay: Payer: Commercial Managed Care - HMO | Admitting: Hematology and Oncology

## 2023-12-21 ENCOUNTER — Other Ambulatory Visit: Payer: Commercial Managed Care - HMO

## 2023-12-23 ENCOUNTER — Other Ambulatory Visit (HOSPITAL_COMMUNITY): Payer: Self-pay

## 2023-12-25 ENCOUNTER — Ambulatory Visit: Payer: Commercial Managed Care - HMO | Admitting: Dermatology

## 2023-12-25 ENCOUNTER — Other Ambulatory Visit: Payer: Self-pay

## 2023-12-27 ENCOUNTER — Inpatient Hospital Stay (HOSPITAL_BASED_OUTPATIENT_CLINIC_OR_DEPARTMENT_OTHER): Admitting: Hematology and Oncology

## 2023-12-27 VITALS — BP 168/87 | HR 98 | Temp 97.7°F | Resp 16 | Wt 184.6 lb

## 2023-12-27 DIAGNOSIS — M898X9 Other specified disorders of bone, unspecified site: Secondary | ICD-10-CM | POA: Diagnosis not present

## 2023-12-27 DIAGNOSIS — C7951 Secondary malignant neoplasm of bone: Secondary | ICD-10-CM

## 2023-12-27 DIAGNOSIS — C61 Malignant neoplasm of prostate: Secondary | ICD-10-CM

## 2023-12-27 NOTE — Progress Notes (Signed)
 [facility] [phone]   [phone]  PROGRESS NOTE Patient Care Team: Ander Bame, MD as PCP - General (Hematology and Oncology) Katheleen Palmer, RN as Oncology Nurse Navigator Rosaline Coma, Sharin David, MD as Consulting Physician (Hematology and Oncology)  Hematological/Oncological History # Metastatic Castrate Sensitive Prostate Cancer  03/18/2022: start of Xtandi  160 mg PO daily.  07/06/2022: last visit with Dr. Dirk Fredericks. Was on Eligard 30 mg q 4 months and Xtandi  160 mg  10/11/2022: establish care with Dr. Rosaline Coma   Interval History:  Dylan [name] 63 y.o. male with medical history significant for [facility] who presents for a follow up visit. The patient's last visit was on 09/20/2023. In the interim since the last visit he has continued on eligard and Xtandi .  On exam today Dylan [name] notes he has been well overall in the interim since her last visit.  He reports that he is taking his Lupron at home with his last dose being in February 2025.  Also in February 2025 he had an episode of influenza A.  He reports that he recovered well from it but is still a little hoarse.  He continues taking his Xtandi  pills under 60 mg p.o. daily.  He reports that he does have some occasional hot flashes and sweats they do occur sporadically.  His shots are administered by home health, typically.  He reports he does continue to urinate about 3 times at night does get some occasional aches in his back and side.  He is taking his pain medication Xtampza  twice daily as well as oxycodone  approximately 4-5 times per week.  He notes his pain is about an 8 out of 10 on occasion.  He also began taking meloxicam  recently.  He otherwise denies any fevers, chills, sweats, nausea, vomiting or diarrhea.  Full 10 point ROS is otherwise negative.   MEDICAL HISTORY:  Past Medical History:  Diagnosis Date   Benign localized prostatic hyperplasia with lower urinary tract symptoms (LUTS)    followed by  dr gay   Centrilobular emphysema (HCC)    Cholelithiasis    Chronic pain    due to bone mets,  left side rib, back, pelvis   CL (cirrhosis of liver) (HCC)    secondary to hx HCV, alcohol and drug abuse   ED (erectile dysfunction)    Hepatic cirrhosis (HCC)    History of COVID-19 06/2019   covid pneumonia w/ acute respriratory failure , hospital admission   History of drug abuse (HCC)    per pt last used drugs late 1980s stated was only marjiuana/ alcohol, however,  documentation in epic states also did acid/ cocaine/ iv drug use   History of hepatitis C    per gi note due to hx iv drug use (pt denies hx IV drug use) ;   treated in 2012 with resolution per last GI progress office note in epic 06-20-2018   Mixed restrictive and obstructive lung disease (HCC)    pulmologist--- dr Thelda Finney;   no oxygen   Moderate persistent asthma    followed by dr Thelda Finney   Oxygen deficiency    PONV (postoperative nausea and vomiting)    Prostate cancer metastatic to bone [facility]) 03/08/2022   primary urologist-- dr gay/  oncologist--- dr Dirk Fredericks;   pt dx 07/ 2023 via bone bx  lesion,  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 (left 4th rib/ L4)    SURGICAL HISTORY: Past Surgical History:  Procedure Laterality Date  COLONOSCOPY     COLONOSCOPY WITH PROPOFOL  N/A 12/11/2014   Procedure: COLONOSCOPY WITH PROPOFOL ;  Surgeon: Claudette Cue, MD;  Location: [facility] ENDOSCOPY;  Service: Endoscopy;  Laterality: N/A;   ESOPHAGEAL BANDING N/A 12/11/2014   Procedure: ESOPHAGEAL BANDING;  Surgeon: Claudette Cue, MD;  Location: [facility] ENDOSCOPY;  Service: Endoscopy;  Laterality: N/A;   ESOPHAGOGASTRODUODENOSCOPY N/A 06/06/2017   Procedure: ESOPHAGOGASTRODUODENOSCOPY (EGD);  Surgeon: Ace Holder, MD;  Location: [facility] ENDOSCOPY;  Service: Gastroenterology;  Laterality: N/A;   ESOPHAGOGASTRODUODENOSCOPY (EGD) WITH PROPOFOL  N/A 12/11/2014   Procedure: ESOPHAGOGASTRODUODENOSCOPY (EGD) WITH PROPOFOL ;  Surgeon: Claudette Cue, MD;  Location: [facility] ENDOSCOPY;  Service: Endoscopy;  Laterality: N/A;   GOLD SEED IMPLANT N/A 05/19/2022   Procedure: GOLD SEED IMPLANT;  Surgeon: Trent Frizzle, MD;  Location: [facility];  Service: Urology;  Laterality: N/A;  ONLY NEEDS 30 MIN   KNEE ARTHROSCOPY Right    1980s   SPACE OAR INSTILLATION N/A 05/19/2022   Procedure: SPACE OAR INSTILLATION;  Surgeon: Trent Frizzle, MD;  Location: [facility];  Service: Urology;  Laterality: N/A;   UPPER GASTROINTESTINAL ENDOSCOPY      SOCIAL HISTORY: Social History   Socioeconomic History   Marital status: Divorced    Spouse name: Not on file   Number of children: 0   Years of education: Not on file   Highest education level: Not on file  Occupational History   Occupation: Investment banker, corporate  Tobacco Use   Smoking status: Former    Current packs/day: 0.00    Average packs/day: 0.5 packs/day for 40.0 years (20.0 ttl pk-yrs)    Types: Cigarettes    Start date: 13    Quit date: 2015    Years since quitting: 10.3   Smokeless tobacco: Never  Vaping Use   Vaping status: Never Used  Substance and Sexual Activity   Alcohol use: Not Currently    Alcohol/week: 2.0 standard drinks of alcohol    Types: 2 Standard drinks or equivalent per week    Comment: 05-05-2022  per pt last alcohol 07/ 2023, hx abuse   Drug use: Not Currently    Comment: 05-05-2022  per pt last used drugs late 1980s - marijuana, acid (denies cocoaine / or IV drug, however there is documentation in epic )   Sexual activity: Yes  Other Topics Concern   Not on file  Social History Narrative   Not on file   Social Drivers of Health   Financial Resource Strain: Medium Risk (03/14/2022)   Overall Financial Resource Strain (CARDIA)    Difficulty of Paying Living Expenses: Somewhat hard  Food Insecurity: Food Insecurity Present (03/14/2022)   Hunger Vital Sign    Worried About Running Out of Food in the Last Year: Sometimes  true    Ran Out of Food in the Last Year: Sometimes true  Transportation Needs: No Transportation Needs (03/14/2022)   PRAPARE - Administrator, Civil Service (Medical): No    Lack of Transportation (Non-Medical): No  Physical Activity: Not on file  Stress: Not on file  Social Connections: Not on file  Intimate Partner Violence: Not on file    FAMILY HISTORY: Family History  Problem Relation Age of Onset   Diabetes Mother    Alzheimer's disease Mother    Rectal cancer Father    Mesothelioma Father    Diabetes Maternal Grandmother    Colon cancer Neg Hx    Stomach cancer Neg Hx  Esophageal cancer Neg Hx     ALLERGIES:  is allergic to other.  MEDICATIONS:  Current Outpatient Medications  Medication Sig Dispense Refill   albuterol  (VENTOLIN  HFA) 108 (90 Base) MCG/ACT inhaler Inhale 2 puffs into the lungs every 4 (four) hours as needed for wheezing or shortness of breath. 6.7 g 0   enzalutamide  (XTANDI ) 40 MG tablet Take 4 tablets (160 mg total) by mouth daily. 120 tablet 0   enzalutamide  (XTANDI ) 40 MG tablet Take 4 tablets (160 mg total) by mouth daily. 120 tablet 0   gabapentin  (NEURONTIN ) 300 MG capsule Take 1 capsule (300 mg total) by mouth at bedtime. 30 capsule 3   meloxicam  (MOBIC ) 15 MG tablet Take 1 tablet (15 mg total) by mouth daily. 30 tablet 1   oxyCODONE  (ROXICODONE ) 15 MG immediate release tablet Take 1 tablet (15 mg total) by mouth every 6 (six) hours as needed. 120 tablet 0   sildenafil  (VIAGRA ) 100 MG tablet Take 1 tablet (100 mg total) by mouth daily as needed for erectile dysfunction. 30 tablet 0   triamcinolone  ointment (KENALOG ) 0.1 % Apply to legs twice a day for 2 weeks then stop. Can use again for flares. 454 g 2   No current facility-administered medications for this visit.    REVIEW OF SYSTEMS:   Constitutional: ( - ) fevers, ( - )  chills , ( - ) night sweats Eyes: ( - ) blurriness of vision, ( - ) double vision, ( - ) watery  eyes Ears, nose, mouth, throat, and face: ( - ) mucositis, ( - ) sore throat Respiratory: ( - ) cough, ( - ) dyspnea, ( - ) wheezes Cardiovascular: ( - ) palpitation, ( - ) chest discomfort, ( - ) lower extremity swelling Gastrointestinal:  ( - ) nausea, ( - ) heartburn, ( - ) change in bowel habits Skin: ( - ) abnormal skin rashes Lymphatics: ( - ) new lymphadenopathy, ( - ) easy bruising Neurological: ( - ) numbness, ( - ) tingling, ( - ) new weaknesses Behavioral/Psych: ( - ) mood change, ( - ) new changes  All other systems were reviewed with the patient and are negative.  PHYSICAL EXAMINATION: Not performed due to virtual visit.   LABORATORY DATA:  I have reviewed the data as listed    Latest Ref Rng & Units 12/14/2023    4:13 PM 09/13/2023    3:52 PM 06/20/2023    3:43 PM  CBC  WBC 4.0 - 10.5 K/uL 3.4  3.7  3.8   Hemoglobin 13.0 - 17.0 g/dL 40.9  81.1  91.4   Hematocrit 39.0 - 52.0 % 38.3  39.2  41.8   Platelets 150 - 400 K/uL 168  155  180        Latest Ref Rng & Units 12/14/2023    4:13 PM 09/13/2023    3:52 PM 06/20/2023    3:43 PM  CMP  Glucose 70 - 99 mg/dL 782  956  92   BUN 8 - 23 mg/dL 15  12  12    Creatinine 0.61 - 1.24 mg/dL 2.13  0.86  5.78   Sodium 135 - 145 mmol/L 141  139  142   Potassium 3.5 - 5.1 mmol/L 4.1  3.7  4.2   Chloride 98 - 111 mmol/L 109  105  107   CO2 22 - 32 mmol/L 27  29  29    Calcium 8.9 - 10.3 mg/dL 9.3  9.4  9.7  Total Protein 6.5 - 8.1 g/dL 7.6  7.4  7.6   Total Bilirubin 0.0 - 1.2 mg/dL 0.8  0.6  0.6   Alkaline Phos 38 - 126 U/L 84  81  98   AST 15 - 41 U/L 25  21  19    ALT 0 - 44 U/L 23  20  18      Lab Results  Component Value Date   MPROTEIN Not Observed 02/11/2022   Lab Results  Component Value Date   KPAFRELGTCHN 23.5 (H) 02/11/2022   LAMBDASER 13.8 02/11/2022   KAPLAMBRATIO 1.70 (H) 02/11/2022    RADIOGRAPHIC STUDIES: No results found.  ASSESSMENT & PLAN Dylan [name] is a 63 y.o. male with medical history  significant for [facility] who presents for a follow up visit.  # Metastatic Castrate Resistant Prostate Cancer --continue Xtandi  160 mg PO daily -- continue leuprolide 30 mg q 4 months. This will need to be continued indefinitely.  The patient receives the shot at home.  He last received his shot in February 2025.  Next due in June 2025 --labs at each visit to include CBC, CMP, Testosterone , and PSA -- s/p radiation therapy to bone metastasis and lymph nodes.  --labs today show 3.4, Hgb 13.3, MCV 89.9, Plt 168. Creatinine and LFTs normal. PSA <0.1.  -- Return to clinic in 3 months time to continue monitoring  # Pain Control --patient currently on Oxycodone  15mg  PO q 6 H PRN --patient notes his pain is manageable and he is taking up to 4-5 oxycodone  per week -- continue Xtampza  13.5 mg q 12 H  -- pain in left rib is stable on his current pain regimen. -- If continued issues with pain control will consult palliative care.   No orders of the defined types were placed in this encounter.   All questions were answered. The patient knows to call the clinic with any problems, questions or concerns.  A total of more than 30 minutes were spent on this encounter with  non-face-to-face time, including preparing to see the patient, ordering tests and/or medications, counseling the patient and coordination of care as outlined above.   Rogerio Clay, MD Department of Hematology/Oncology [facility] at [facility] Phone: [phone] Pager: [phone] Email: [email or web address]    12/27/2023 4:43 PM

## 2024-01-15 ENCOUNTER — Other Ambulatory Visit: Payer: Self-pay | Admitting: Hematology and Oncology

## 2024-01-15 ENCOUNTER — Other Ambulatory Visit: Payer: Self-pay

## 2024-01-17 ENCOUNTER — Other Ambulatory Visit: Payer: Self-pay

## 2024-01-17 ENCOUNTER — Other Ambulatory Visit (HOSPITAL_COMMUNITY): Payer: Self-pay

## 2024-01-17 ENCOUNTER — Other Ambulatory Visit: Payer: Self-pay | Admitting: Pharmacy Technician

## 2024-01-17 ENCOUNTER — Other Ambulatory Visit: Payer: Self-pay | Admitting: Hematology and Oncology

## 2024-01-17 ENCOUNTER — Telehealth: Payer: Self-pay | Admitting: *Deleted

## 2024-01-17 DIAGNOSIS — C61 Malignant neoplasm of prostate: Secondary | ICD-10-CM

## 2024-01-17 MED ORDER — OXYCODONE HCL 15 MG PO TABS
15.0000 mg | ORAL_TABLET | Freq: Four times a day (QID) | ORAL | 0 refills | Status: DC | PRN
  Filled 2024-01-17: qty 120, 30d supply, fill #0

## 2024-01-17 NOTE — Progress Notes (Signed)
 Specialty Pharmacy Refill Coordination Note  Dylan [name] is a 63 y.o. male contacted today regarding refills of specialty medication(s) Enzalutamide  (XTANDI )   Patient requested Delivery   Delivery date: 01/25/24   Verified address: [address] [place]   Medication will be filled on 01/24/24.  This fill date is pending response to refill request from provider. Patient is aware and if they have not received fill by intended date they must follow up with pharmacy.

## 2024-01-17 NOTE — Telephone Encounter (Signed)
 Received vm message from pt stating that he needs a refill of his oxycodone . Last filled on 12/18/23 for #120 tablets to [facility] pharmacy  Dr. Rosaline Coma made aware.

## 2024-01-18 ENCOUNTER — Other Ambulatory Visit (HOSPITAL_COMMUNITY): Payer: Self-pay

## 2024-01-19 ENCOUNTER — Other Ambulatory Visit: Payer: Self-pay

## 2024-01-19 MED ORDER — ENZALUTAMIDE 40 MG PO TABS
160.0000 mg | ORAL_TABLET | Freq: Every day | ORAL | 0 refills | Status: DC
  Filled 2024-01-19: qty 120, 30d supply, fill #0

## 2024-01-23 ENCOUNTER — Telehealth: Payer: Self-pay

## 2024-01-23 DIAGNOSIS — K7469 Other cirrhosis of liver: Secondary | ICD-10-CM

## 2024-01-23 NOTE — Telephone Encounter (Signed)
 Order is in.  MyChart message to patient to go to the lab

## 2024-01-23 NOTE — Telephone Encounter (Signed)
-----   Message from [facility] sent at 08/03/2023  4:06 PM EST ----- Regarding: AFP Due for AFP one day next week. Input order (Cirrhosis)

## 2024-02-04 ENCOUNTER — Other Ambulatory Visit (HOSPITAL_COMMUNITY): Payer: Self-pay

## 2024-02-05 ENCOUNTER — Other Ambulatory Visit (HOSPITAL_COMMUNITY): Payer: Self-pay

## 2024-02-05 ENCOUNTER — Other Ambulatory Visit: Payer: Self-pay

## 2024-02-06 ENCOUNTER — Other Ambulatory Visit (HOSPITAL_COMMUNITY): Payer: Self-pay

## 2024-02-06 MED ORDER — MELOXICAM 15 MG PO TABS
15.0000 mg | ORAL_TABLET | Freq: Every day | ORAL | 1 refills | Status: DC
  Filled 2024-02-06: qty 30, 30d supply, fill #0
  Filled 2024-03-03: qty 30, 30d supply, fill #1

## 2024-02-07 ENCOUNTER — Other Ambulatory Visit (HOSPITAL_COMMUNITY): Payer: Self-pay

## 2024-02-07 ENCOUNTER — Other Ambulatory Visit: Payer: Self-pay

## 2024-02-07 ENCOUNTER — Ambulatory Visit: Admitting: Gastroenterology

## 2024-02-08 ENCOUNTER — Telehealth: Payer: Self-pay | Admitting: *Deleted

## 2024-02-08 DIAGNOSIS — R932 Abnormal findings on diagnostic imaging of liver and biliary tract: Secondary | ICD-10-CM

## 2024-02-08 DIAGNOSIS — K7469 Other cirrhosis of liver: Secondary | ICD-10-CM

## 2024-02-08 DIAGNOSIS — K769 Liver disease, unspecified: Secondary | ICD-10-CM

## 2024-02-08 NOTE — Telephone Encounter (Signed)
-----   Message -----  From: Glennette Lanius, RN  Sent: 01/10/2024  12:00 AM EDT  To: Glennette Lanius, RN   Needs repeat liver mri w/wo around 01/28/24 for liver lesion f/u; [facility] pt; see 07/25/23 imaging result notes

## 2024-02-15 ENCOUNTER — Other Ambulatory Visit: Payer: Self-pay | Admitting: Hematology and Oncology

## 2024-02-16 ENCOUNTER — Other Ambulatory Visit (HOSPITAL_COMMUNITY): Payer: Self-pay

## 2024-02-16 ENCOUNTER — Other Ambulatory Visit: Payer: Self-pay | Admitting: Hematology and Oncology

## 2024-02-16 ENCOUNTER — Telehealth: Payer: Self-pay | Admitting: *Deleted

## 2024-02-16 ENCOUNTER — Other Ambulatory Visit: Payer: Self-pay

## 2024-02-16 ENCOUNTER — Encounter (INDEPENDENT_AMBULATORY_CARE_PROVIDER_SITE_OTHER): Payer: Self-pay

## 2024-02-16 DIAGNOSIS — C7951 Secondary malignant neoplasm of bone: Secondary | ICD-10-CM

## 2024-02-16 MED ORDER — OXYCODONE HCL 15 MG PO TABS
15.0000 mg | ORAL_TABLET | Freq: Four times a day (QID) | ORAL | 0 refills | Status: DC | PRN
  Filled 2024-02-16: qty 120, 30d supply, fill #0

## 2024-02-16 MED ORDER — ENZALUTAMIDE 40 MG PO TABS
160.0000 mg | ORAL_TABLET | Freq: Every day | ORAL | 0 refills | Status: DC
  Filled 2024-02-16 (×2): qty 120, 30d supply, fill #0

## 2024-02-16 NOTE — Telephone Encounter (Signed)
 Received vm message from pt requesting refill for his Oxycodone  15 mg. Last filled on 01/17/24 for #120 tabs @ [facility] Dr. Rosaline Coma made aware.

## 2024-02-16 NOTE — Progress Notes (Signed)
 Specialty Pharmacy Refill Coordination Note  Dylan [name] is a 63 y.o. male contacted today regarding refills of specialty medication(s) Enzalutamide  (XTANDI )   Patient requested Delivery   Delivery date: 02/20/24   Verified address: [address]   Medication will be filled on 02/19/24.

## 2024-02-20 NOTE — Telephone Encounter (Signed)
 Imaging location had to be changed to [facility] [address] location due to insurance constraints. Approval was received for [address] location.  [facility] reached out to patient as below:  02/19/24 PT CALLBACK STATING HE WOULD LIKE TO PUT OFF FOR NOW. HE WILL CB TO SCHEDULE//EPIC JW  02-19-2024 1ST ATTEMPT EPIC ORDER LMOM/DP

## 2024-02-26 ENCOUNTER — Ambulatory Visit (HOSPITAL_COMMUNITY)

## 2024-03-05 ENCOUNTER — Encounter (INDEPENDENT_AMBULATORY_CARE_PROVIDER_SITE_OTHER): Payer: Self-pay

## 2024-03-06 ENCOUNTER — Other Ambulatory Visit (HOSPITAL_COMMUNITY): Payer: Self-pay

## 2024-03-06 ENCOUNTER — Other Ambulatory Visit: Payer: Self-pay | Admitting: Hematology and Oncology

## 2024-03-06 ENCOUNTER — Other Ambulatory Visit: Payer: Self-pay | Admitting: Pharmacy Technician

## 2024-03-06 ENCOUNTER — Other Ambulatory Visit: Payer: Self-pay

## 2024-03-06 DIAGNOSIS — C61 Malignant neoplasm of prostate: Secondary | ICD-10-CM

## 2024-03-06 MED ORDER — ENZALUTAMIDE 40 MG PO TABS
160.0000 mg | ORAL_TABLET | Freq: Every day | ORAL | 0 refills | Status: DC
  Filled 2024-03-06: qty 120, 30d supply, fill #0

## 2024-03-06 NOTE — Progress Notes (Signed)
 Specialty Pharmacy Refill Coordination Note  Dylan [name] is a 63 y.o. male contacted today regarding refills of specialty medication(s) Enzalutamide  (XTANDI )   Patient requested (Patient-Rptd) Delivery   Delivery date: 03/19/2024 Verified address: (Patient-Rptd) [address]   Medication will be filled on 03/18/2024.   RR sent to MD

## 2024-03-17 ENCOUNTER — Other Ambulatory Visit: Payer: Self-pay | Admitting: Hematology and Oncology

## 2024-03-18 ENCOUNTER — Other Ambulatory Visit (HOSPITAL_COMMUNITY): Payer: Self-pay

## 2024-03-18 MED ORDER — OXYCODONE HCL 15 MG PO TABS
15.0000 mg | ORAL_TABLET | Freq: Four times a day (QID) | ORAL | 0 refills | Status: DC | PRN
  Filled 2024-03-18: qty 120, 30d supply, fill #0

## 2024-03-27 ENCOUNTER — Inpatient Hospital Stay

## 2024-03-27 ENCOUNTER — Other Ambulatory Visit: Payer: Self-pay | Admitting: Hematology and Oncology

## 2024-03-27 ENCOUNTER — Inpatient Hospital Stay: Attending: Hematology and Oncology | Admitting: Hematology and Oncology

## 2024-03-27 VITALS — BP 154/94 | HR 83 | Temp 98.4°F | Resp 15 | Wt 184.0 lb

## 2024-03-27 DIAGNOSIS — R0781 Pleurodynia: Secondary | ICD-10-CM | POA: Diagnosis not present

## 2024-03-27 DIAGNOSIS — Z923 Personal history of irradiation: Secondary | ICD-10-CM | POA: Insufficient documentation

## 2024-03-27 DIAGNOSIS — Z87891 Personal history of nicotine dependence: Secondary | ICD-10-CM | POA: Insufficient documentation

## 2024-03-27 DIAGNOSIS — C61 Malignant neoplasm of prostate: Secondary | ICD-10-CM | POA: Diagnosis present

## 2024-03-27 DIAGNOSIS — C779 Secondary and unspecified malignant neoplasm of lymph node, unspecified: Secondary | ICD-10-CM | POA: Diagnosis not present

## 2024-03-27 DIAGNOSIS — R52 Pain, unspecified: Secondary | ICD-10-CM | POA: Diagnosis not present

## 2024-03-27 DIAGNOSIS — M898X9 Other specified disorders of bone, unspecified site: Secondary | ICD-10-CM

## 2024-03-27 DIAGNOSIS — C7951 Secondary malignant neoplasm of bone: Secondary | ICD-10-CM

## 2024-03-27 DIAGNOSIS — Z79899 Other long term (current) drug therapy: Secondary | ICD-10-CM | POA: Insufficient documentation

## 2024-03-27 LAB — CMP (CANCER CENTER ONLY)
ALT: 16 U/L (ref 0–44)
AST: 20 U/L (ref 15–41)
Albumin: 4.1 g/dL (ref 3.5–5.0)
Alkaline Phosphatase: 91 U/L (ref 38–126)
Anion gap: 4 — ABNORMAL LOW (ref 5–15)
BUN: 11 mg/dL (ref 8–23)
CO2: 32 mmol/L (ref 22–32)
Calcium: 9.4 mg/dL (ref 8.9–10.3)
Chloride: 107 mmol/L (ref 98–111)
Creatinine: 1 mg/dL (ref 0.61–1.24)
GFR, Estimated: >60 mL/min (ref >60)
Glucose, Bld: 100 mg/dL — ABNORMAL HIGH (ref 70–99)
Potassium: 4.5 mmol/L (ref 3.5–5.1)
Sodium: 143 mmol/L (ref 135–145)
Total Bilirubin: 0.7 mg/dL (ref 0.0–1.2)
Total Protein: 7.4 g/dL (ref 6.5–8.1)

## 2024-03-27 LAB — CBC WITH DIFFERENTIAL (CANCER CENTER ONLY)
Abs Immature Granulocytes: 0 K/uL (ref 0.00–0.07)
Basophils Absolute: 0 K/uL (ref 0.0–0.1)
Basophils Relative: 1 %
Eosinophils Absolute: 0.5 K/uL (ref 0.0–0.5)
Eosinophils Relative: 13 %
HCT: 39.2 % (ref 39.0–52.0)
Hemoglobin: 13.2 g/dL (ref 13.0–17.0)
Immature Granulocytes: 0 %
Lymphocytes Relative: 32 %
Lymphs Abs: 1.1 K/uL (ref 0.7–4.0)
MCH: 30.8 pg (ref 26.0–34.0)
MCHC: 33.7 g/dL (ref 30.0–36.0)
MCV: 91.4 fL (ref 80.0–100.0)
Monocytes Absolute: 0.6 K/uL (ref 0.1–1.0)
Monocytes Relative: 17 %
Neutro Abs: 1.3 K/uL — ABNORMAL LOW (ref 1.7–7.7)
Neutrophils Relative %: 37 %
Platelet Count: 167 K/uL (ref 150–400)
RBC: 4.29 MIL/uL (ref 4.22–5.81)
RDW: 13.7 % (ref 11.5–15.5)
WBC Count: 3.5 K/uL — ABNORMAL LOW (ref 4.0–10.5)
nRBC: 0 % (ref 0.0–0.2)

## 2024-03-27 NOTE — Progress Notes (Signed)
 [facility] [phone]   [phone]  PROGRESS NOTE Patient Care Team: [name] [name], MD as PCP - General (Hematology and Oncology) Vertell Pont, RN as Oncology Nurse Navigator Federico, Norleen [name], MD as Consulting Physician (Hematology and Oncology)  Hematological/Oncological History # Metastatic Castrate Sensitive Prostate Cancer  03/18/2022: start of Xtandi  160 mg PO daily.  07/06/2022: last visit with Dr. Amadeo. Was on Eligard 30 mg q 4 months and Xtandi  160 mg  10/11/2022: establish care with Dr. Federico   Interval History:  Dylan [name] 63 y.o. male with medical history significant for [facility] who presents for a follow up visit. The patient's last visit was on 12/27/2023. In the interim since the last visit he has continued on eligard and Xtandi .  On exam today [name] notes he has been well overall in the interim since her last visit.  He reports he is doing his best to try to cut back on salt intake due to his elevated blood pressure.  He reports that he got his last shot in June and will be due again in October 2025.  He reports that his energy and appetite are both strong.  He takes about 4 oxycodone  per day and mostly feels the pain in the left side of his ribs.  The Xtandi  medication is causing him some hot flashes but he is taking it faithfully.  He is not having any other major side effect such as nausea, vomiting, or diarrhea.  He reports he does get about 2-4 hot flashes per day and reports that he tolerates it well.  Otherwise he denies any side effects or symptoms as a result of his treatment.  A full 10 point ROS is otherwise negative.   MEDICAL HISTORY:  Past Medical History:  Diagnosis Date   Benign localized prostatic hyperplasia with lower urinary tract symptoms (LUTS)    followed by dr gay   Centrilobular emphysema (HCC)    Cholelithiasis    Chronic pain    due to bone mets,  left side rib, back, pelvis   CL (cirrhosis of  liver) (HCC)    secondary to hx HCV, alcohol and drug abuse   ED (erectile dysfunction)    Hepatic cirrhosis (HCC)    History of COVID-19 06/2019   covid pneumonia w/ acute respriratory failure , hospital admission   History of drug abuse (HCC)    per pt last used drugs late 1980s stated was only marjiuana/ alcohol, however,  documentation in epic states also did acid/ cocaine/ iv drug use   History of hepatitis C    per gi note due to hx iv drug use (pt denies hx IV drug use) ;   treated in 2012 with resolution per last GI progress office note in epic 06-20-2018   Mixed restrictive and obstructive lung disease (HCC)    pulmologist--- dr brenna;   no oxygen   Moderate persistent asthma    followed by dr brenna   Oxygen deficiency    PONV (postoperative nausea and vomiting)    Prostate cancer metastatic to bone [facility]) 03/08/2022   primary urologist-- dr gay/  oncologist--- dr amadeo;   pt dx 07/ 2023 via bone bx  lesion,  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 (left 4th rib/ L4)    SURGICAL HISTORY: Past Surgical History:  Procedure Laterality Date   COLONOSCOPY     COLONOSCOPY WITH PROPOFOL  N/A 12/11/2014   Procedure: COLONOSCOPY WITH PROPOFOL ;  Surgeon: Lamar  JONETTA Aho, MD;  Location: [facility] ENDOSCOPY;  Service: Endoscopy;  Laterality: N/A;   ESOPHAGEAL BANDING N/A 12/11/2014   Procedure: ESOPHAGEAL BANDING;  Surgeon: Lamar JONETTA Aho, MD;  Location: [facility] ENDOSCOPY;  Service: Endoscopy;  Laterality: N/A;   ESOPHAGOGASTRODUODENOSCOPY N/A 06/06/2017   Procedure: ESOPHAGOGASTRODUODENOSCOPY (EGD);  Surgeon: Leigh Elspeth SQUIBB, MD;  Location: [facility];  Service: Gastroenterology;  Laterality: N/A;   ESOPHAGOGASTRODUODENOSCOPY (EGD) WITH PROPOFOL  N/A 12/11/2014   Procedure: ESOPHAGOGASTRODUODENOSCOPY (EGD) WITH PROPOFOL ;  Surgeon: Lamar JONETTA Aho, MD;  Location: [facility] ENDOSCOPY;  Service: Endoscopy;  Laterality: N/A;   GOLD SEED IMPLANT N/A 05/19/2022   Procedure: GOLD SEED IMPLANT;   Surgeon: Matilda Senior, MD;  Location: [facility];  Service: Urology;  Laterality: N/A;  ONLY NEEDS 30 MIN   KNEE ARTHROSCOPY Right    1980s   SPACE OAR INSTILLATION N/A 05/19/2022   Procedure: SPACE OAR INSTILLATION;  Surgeon: Matilda Senior, MD;  Location: [facility];  Service: Urology;  Laterality: N/A;   UPPER GASTROINTESTINAL ENDOSCOPY      SOCIAL HISTORY: Social History   Socioeconomic History   Marital status: Divorced    Spouse name: Not on file   Number of children: 0   Years of education: Not on file   Highest education level: Not on file  Occupational History   Occupation: Investment banker, corporate  Tobacco Use   Smoking status: Former    Current packs/day: 0.00    Average packs/day: 0.5 packs/day for 40.0 years (20.0 ttl pk-yrs)    Types: Cigarettes    Start date: 11    Quit date: 2015    Years since quitting: 10.6   Smokeless tobacco: Never  Vaping Use   Vaping status: Never Used  Substance and Sexual Activity   Alcohol use: Not Currently    Alcohol/week: 2.0 standard drinks of alcohol    Types: 2 Standard drinks or equivalent per week    Comment: 05-05-2022  per pt last alcohol 07/ 2023, hx abuse   Drug use: Not Currently    Comment: 05-05-2022  per pt last used drugs late 1980s - marijuana, acid (denies cocoaine / or IV drug, however there is documentation in epic )   Sexual activity: Yes  Other Topics Concern   Not on file  Social History Narrative   Not on file   Social Drivers of Health   Financial Resource Strain: Medium Risk (03/14/2022)   Overall Financial Resource Strain (CARDIA)    Difficulty of Paying Living Expenses: Somewhat hard  Food Insecurity: Food Insecurity Present (03/14/2022)   Hunger Vital Sign    Worried About Running Out of Food in the Last Year: Sometimes true    Ran Out of Food in the Last Year: Sometimes true  Transportation Needs: No Transportation Needs (03/14/2022)   PRAPARE - Therapist, art (Medical): No    Lack of Transportation (Non-Medical): No  Physical Activity: Not on file  Stress: Not on file  Social Connections: Not on file  Intimate Partner Violence: Not on file    FAMILY HISTORY: Family History  Problem Relation Age of Onset   Diabetes Mother    Alzheimer's disease Mother    Rectal cancer Father    Mesothelioma Father    Diabetes Maternal Grandmother    Colon cancer Neg Hx    Stomach cancer Neg Hx    Esophageal cancer Neg Hx     ALLERGIES:  is allergic to other.  MEDICATIONS:  Current  Outpatient Medications  Medication Sig Dispense Refill   albuterol  (VENTOLIN  HFA) 108 (90 Base) MCG/ACT inhaler Inhale 2 puffs into the lungs every 4 (four) hours as needed for wheezing or shortness of breath. 6.7 g 0   enzalutamide  (XTANDI ) 40 MG tablet Take 4 tablets (160 mg total) by mouth daily. 120 tablet 0   enzalutamide  (XTANDI ) 40 MG tablet Take 4 tablets (160 mg total) by mouth daily. 120 tablet 0   gabapentin  (NEURONTIN ) 300 MG capsule Take 1 capsule (300 mg total) by mouth at bedtime. 30 capsule 3   meloxicam  (MOBIC ) 15 MG tablet Take 1 tablet (15 mg total) by mouth daily. 30 tablet 1   oxyCODONE  (ROXICODONE ) 15 MG immediate release tablet Take 1 tablet (15 mg total) by mouth every 6 (six) hours as needed. 120 tablet 0   sildenafil  (VIAGRA ) 100 MG tablet Take 1 tablet (100 mg total) by mouth daily as needed for erectile dysfunction. 30 tablet 0   triamcinolone  ointment (KENALOG ) 0.1 % Apply to legs twice a day for 2 weeks then stop. Can use again for flares. 454 g 2   No current facility-administered medications for this visit.    REVIEW OF SYSTEMS:   Constitutional: ( - ) fevers, ( - )  chills , ( - ) night sweats Eyes: ( - ) blurriness of vision, ( - ) double vision, ( - ) watery eyes Ears, nose, mouth, throat, and face: ( - ) mucositis, ( - ) sore throat Respiratory: ( - ) cough, ( - ) dyspnea, ( - ) wheezes Cardiovascular: ( - )  palpitation, ( - ) chest discomfort, ( - ) lower extremity swelling Gastrointestinal:  ( - ) nausea, ( - ) heartburn, ( - ) change in bowel habits Skin: ( - ) abnormal skin rashes Lymphatics: ( - ) new lymphadenopathy, ( - ) easy bruising Neurological: ( - ) numbness, ( - ) tingling, ( - ) new weaknesses Behavioral/Psych: ( - ) mood change, ( - ) new changes  All other systems were reviewed with the patient and are negative.  PHYSICAL EXAMINATION: Not performed due to virtual visit.   LABORATORY DATA:  I have reviewed the data as listed    Latest Ref Rng & Units 03/27/2024    2:30 PM 12/14/2023    4:13 PM 09/13/2023    3:52 PM  CBC  WBC 4.0 - 10.5 K/uL 3.5  3.4  3.7   Hemoglobin 13.0 - 17.0 g/dL 86.7  86.6  86.5   Hematocrit 39.0 - 52.0 % 39.2  38.3  39.2   Platelets 150 - 400 K/uL 167  168  155        Latest Ref Rng & Units 03/27/2024    2:30 PM 12/14/2023    4:13 PM 09/13/2023    3:52 PM  CMP  Glucose 70 - 99 mg/dL 899  878  816   BUN 8 - 23 mg/dL 11  15  12    Creatinine 0.61 - 1.24 mg/dL 8.99  8.90  8.88   Sodium 135 - 145 mmol/L 143  141  139   Potassium 3.5 - 5.1 mmol/L 4.5  4.1  3.7   Chloride 98 - 111 mmol/L 107  109  105   CO2 22 - 32 mmol/L 32  27  29   Calcium 8.9 - 10.3 mg/dL 9.4  9.3  9.4   Total Protein 6.5 - 8.1 g/dL 7.4  7.6  7.4   Total Bilirubin 0.0 -  1.2 mg/dL 0.7  0.8  0.6   Alkaline Phos 38 - 126 U/L 91  84  81   AST 15 - 41 U/L 20  25  21    ALT 0 - 44 U/L 16  23  20      Lab Results  Component Value Date   MPROTEIN Not Observed 02/11/2022   Lab Results  Component Value Date   KPAFRELGTCHN 23.5 (H) 02/11/2022   LAMBDASER 13.8 02/11/2022   KAPLAMBRATIO 1.70 (H) 02/11/2022    RADIOGRAPHIC STUDIES: No results found.  ASSESSMENT & PLAN KAESON KLEINERT is a 63 y.o. male with medical history significant for [facility] who presents for a follow up visit.  # Metastatic Castrate Resistant Prostate Cancer --continue Xtandi  160 mg PO daily -- continue  leuprolide 30 mg q 4 months. This will need to be continued indefinitely.  The patient receives the shot at home.  He last received his shot in February 2025.  Next due in June 2025 --labs at each visit to include CBC, CMP, Testosterone , and PSA -- s/p radiation therapy to bone metastasis and lymph nodes.  --labs today show 3.5, Hgb 13.2, MCV 91.4, Plt 167. Creatinine and LFTs normal. PSA <0.1.  -- Return to clinic in 3 months time to continue monitoring  # Pain Control --patient currently on Oxycodone  15mg  PO q 6 H PRN --patient notes his pain is manageable and he is taking up to 4-5 oxycodone  per day -- Patient was taking Xtampza  but discontinued it due to nausea and vomiting. -- pain in left rib is stable on his current pain regimen. -- If continued issues with pain control will consult palliative care.   No orders of the defined types were placed in this encounter.   All questions were answered. The patient knows to call the clinic with any problems, questions or concerns.  A total of more than 30 minutes were spent on this encounter with  non-face-to-face time, including preparing to see the patient, ordering tests and/or medications, counseling the patient and coordination of care as outlined above.   Norleen IVAR Kidney, MD Department of Hematology/Oncology [facility] Phone: [phone] Pager: [phone] Email: [email or web address]    04/02/2024 10:10 AM

## 2024-03-28 LAB — PROSTATE-SPECIFIC AG, SERUM (LABCORP): Prostate Specific Ag, Serum: <0.1 ng/mL (ref 0.0–4.0)

## 2024-03-28 LAB — TESTOSTERONE: Testosterone: 35 ng/dL — ABNORMAL LOW (ref 264–916)

## 2024-04-02 ENCOUNTER — Ambulatory Visit: Payer: Self-pay | Admitting: *Deleted

## 2024-04-02 ENCOUNTER — Encounter: Payer: Self-pay | Admitting: Hematology and Oncology

## 2024-04-02 NOTE — Telephone Encounter (Addendum)
 Contacted Mr. Dylan [name] with message from Dr. Federico regarding lab results. He verbalized understanding of information.    ----- Message from Norleen ONEIDA Federico IV sent at 04/02/2024 10:11 AM EDT ----- Please let Mr. Dylan [name] know that his PSA is undetectable and his testosterone  is at target.  We will continue with his current therapy.  We will plan to see him back in October 2025 as scheduled. ----- Message ----- From: Rebecka, [facility] [place] Sent: 03/27/2024   2:46 PM EDT To: Norleen ONEIDA Federico MADISON, MD

## 2024-04-05 ENCOUNTER — Other Ambulatory Visit (HOSPITAL_COMMUNITY): Payer: Self-pay

## 2024-04-07 ENCOUNTER — Other Ambulatory Visit (HOSPITAL_COMMUNITY): Payer: Self-pay

## 2024-04-07 ENCOUNTER — Encounter (INDEPENDENT_AMBULATORY_CARE_PROVIDER_SITE_OTHER): Payer: Self-pay

## 2024-04-08 ENCOUNTER — Other Ambulatory Visit: Payer: Self-pay

## 2024-04-08 ENCOUNTER — Other Ambulatory Visit: Payer: Self-pay | Admitting: Hematology and Oncology

## 2024-04-08 ENCOUNTER — Other Ambulatory Visit (HOSPITAL_BASED_OUTPATIENT_CLINIC_OR_DEPARTMENT_OTHER): Payer: Self-pay

## 2024-04-08 DIAGNOSIS — C61 Malignant neoplasm of prostate: Secondary | ICD-10-CM

## 2024-04-08 DIAGNOSIS — C7951 Secondary malignant neoplasm of bone: Secondary | ICD-10-CM

## 2024-04-08 IMAGING — CT NM PET TUM IMG INITIAL (PI) SKULL BASE T - THIGH
7 series · 25 of 25 positions shown · non-contrast
Comparison: MRI abdomen, same day and chest CT 01/10/2022

CLINICAL DATA: Initial treatment strategy for lytic destructive
bone lesions.

EXAM:
NUCLEAR MEDICINE PET SKULL BASE TO THIGH
TECHNIQUE: 9.2 mCi F-18 FDG was injected intravenously. Full-ring PET imaging
was performed from the skull base to thigh after the radiotracer. CT
data was obtained and used for attenuation correction and anatomic
localization.
Fasting blood glucose: 93 mg/dl

[Series 3: pet sk_thigh ac · axial · 5.0mm · 4.07mm/px · z∈[-938,-34]mm · 6 of 227 slices shown]
[im 1/227]
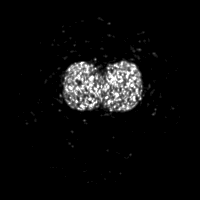
[im 46/227]
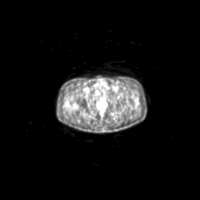
[im 91/227]
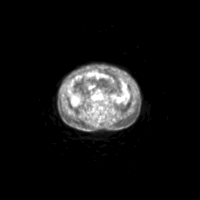
[im 136/227]
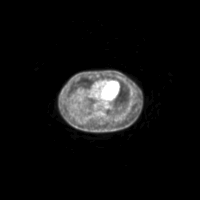
[im 181/227]
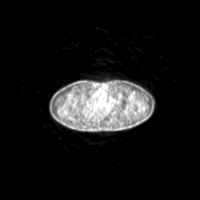
[im 227/227]
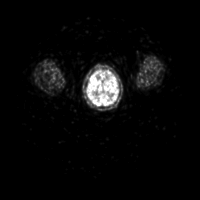

[Series 4: ct sk_thigh 5.0 br38 · axial · 5.0mm · 0.98mm/px · z∈[-938,-34]mm · 6 of 227 slices shown]
[im 1/227]
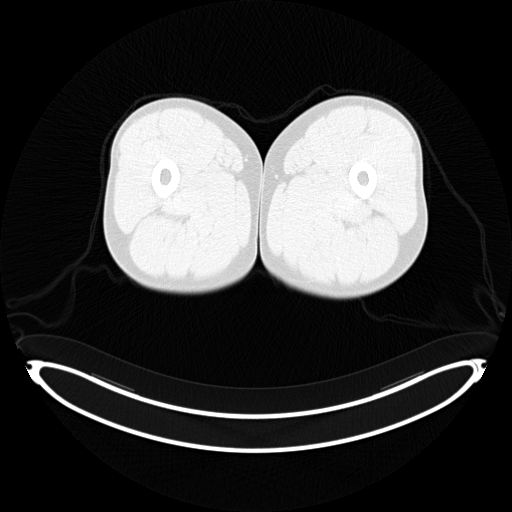
[im 46/227]
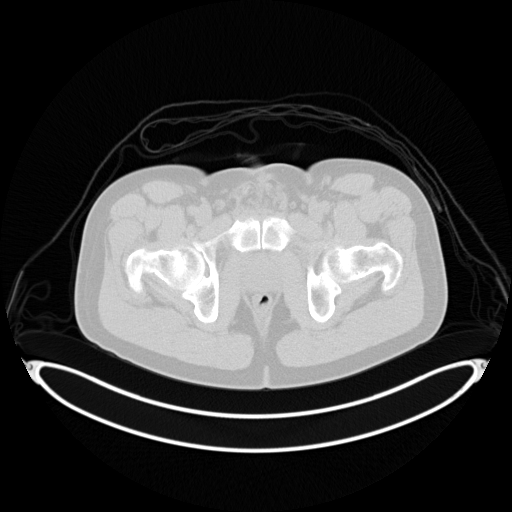
[im 91/227]
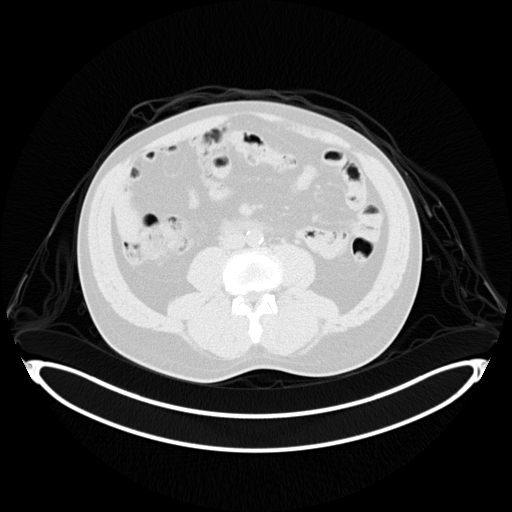
[im 136/227]
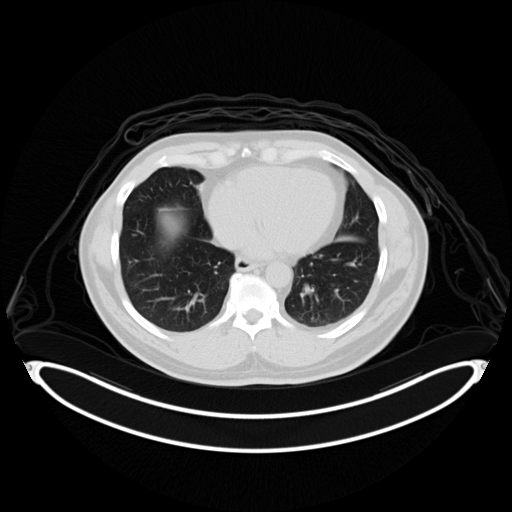
[im 181/227  brain]
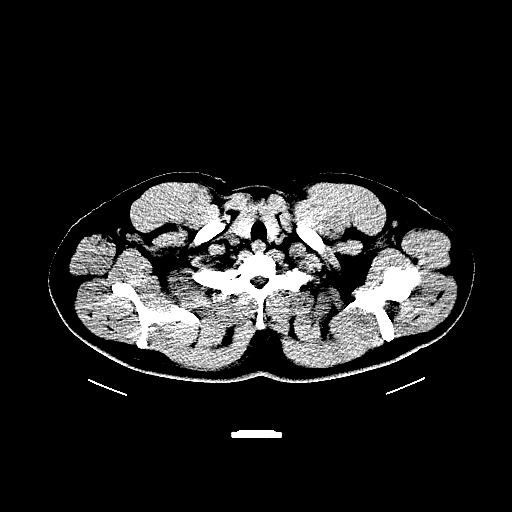
[im 227/227  brain]
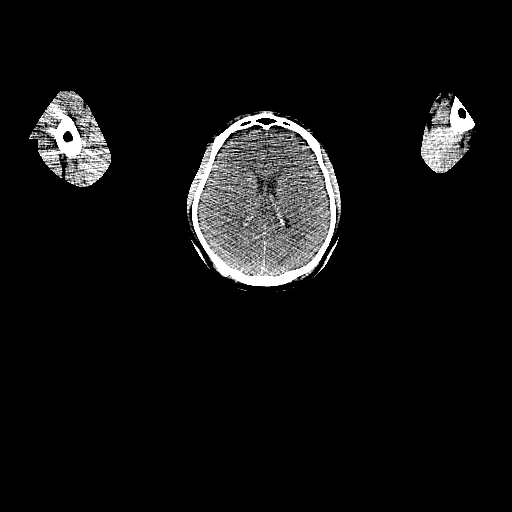

[Series 5: pet sk_thigh nac · axial · 5.0mm · 4.07mm/px · z∈[-938,-34]mm · 5 of 227 slices shown]
[im 1/227]
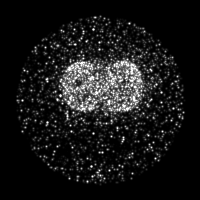
[im 57/227]
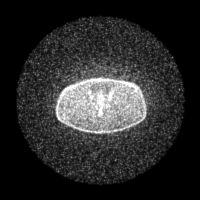
[im 114/227]
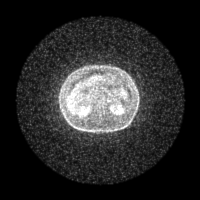
[im 170/227]
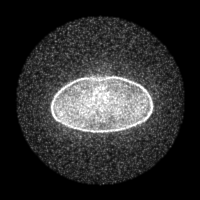
[im 227/227]
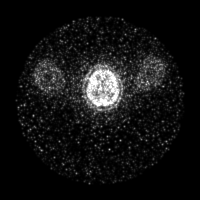

[Series 7: ct 5.0 bl57 lung_bone · axial · 5.0mm · 0.61mm/px · 1 of 61 slices shown]
[im 1/61]
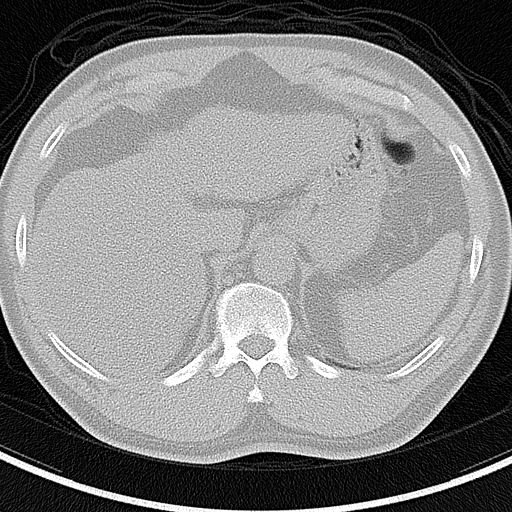

[Series 603: fused tra · 5 of 214 slices shown]
[im 1/214]
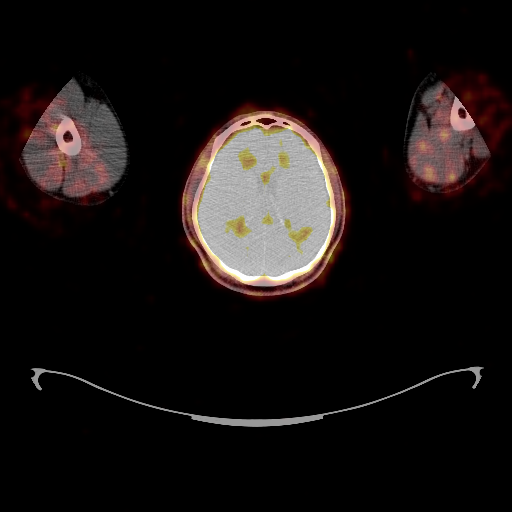
[im 54/214]
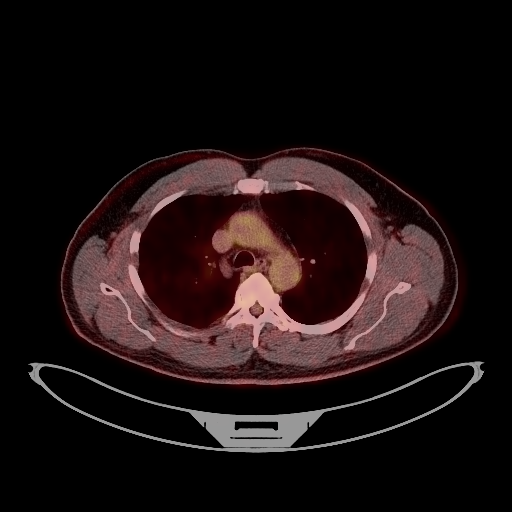
[im 107/214]
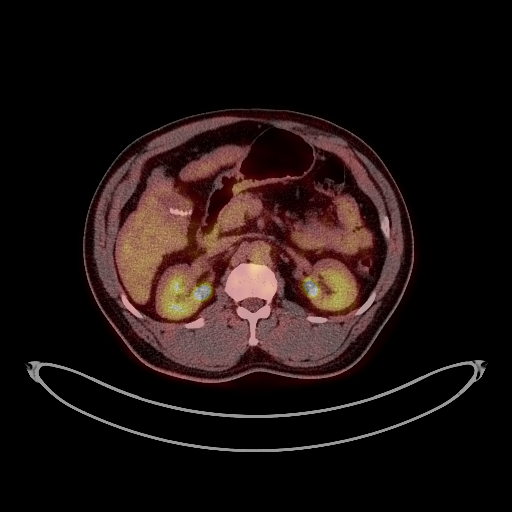
[im 160/214]
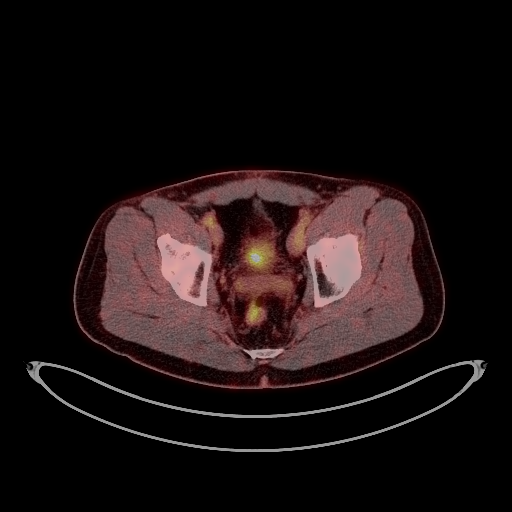
[im 214/214]
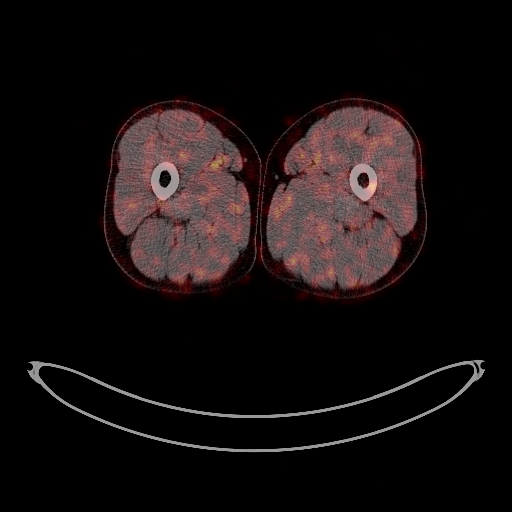

[Series 604: fused cor · 1 of 58 slices shown]
[im 1/58]
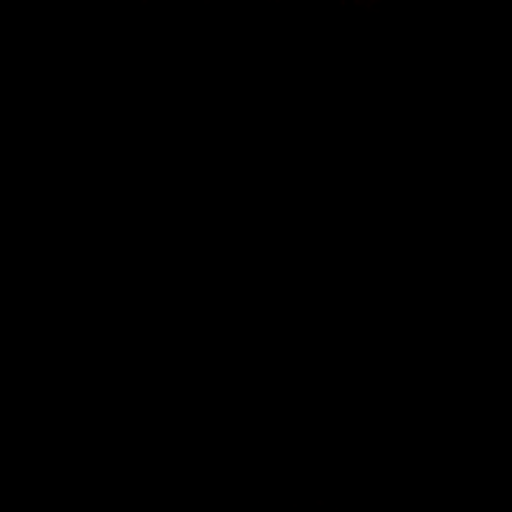

[Series 605: mip pet · coronal · 1.88mm/px · 1 of 32 slices shown]
[im 1/32]
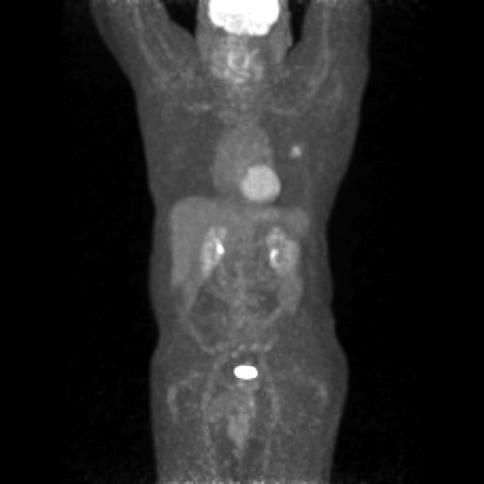

[25 of 25 positions shown; findings below may reference images not displayed]

FINDINGS: Mediastinal blood pool activity: SUV max

Liver activity: SUV max NA

NECK: No hypermetabolic lymph nodes in the neck.

Incidental CT findings: Right maxillary sinus disease.

CHEST: No hypermetabolic mediastinal or hilar nodes. No suspicious
pulmonary nodules on the CT scan.

Incidental CT findings: none

ABDOMEN/PELVIS: No abnormal hypermetabolic activity within the
liver, pancreas, adrenal glands, or spleen. No hypermetabolic lymph
nodes in the abdomen or pelvis.

There is mild asymmetric hypermetabolism in the peripheral zone of
the prostate gland on the right side. SUV max is 4.99. Could not
exclude the possibility of prostate cancer. Recommend correlation
with PSA level.

Incidental CT findings: Advanced cirrhotic changes involving the
liver. The MRI shows small enhancing liver lesions indeterminate for
HCC.

Numerous small layering gallstones are noted the gallbladder. Renal
calculi are noted bilaterally. Scattered aortic and iliac artery
calcifications but no aneurysm.

SKELETON: The left fourth rib lesion is hypermetabolic with SUV max
of 5.36.

The mixed lytic and sclerotic bone lesion causing spinal canal
compromise at L4 demonstrates mild hypermetabolism with SUV max of
3.31.

I do not see any other definite lesions. Biopsy of the rib lesion
may be indicated.

Incidental CT findings: none
IMPRESSION: 1. Hypermetabolic left fourth rib lesion and L4 vertebral body
lesion consistent with metastatic disease.
2. Mild hypermetabolism in the peripheral zone of the prostate gland
on the right side. Could not exclude prostate cancer. Recommend
correlation with PSA level.
3. No other primary neoplastic site is identified. The small liver
lesions seen on the MRI are not hypermetabolic the PET scan.
4. Stable cirrhotic changes involving the liver.
5. Other incidental findings including gallstones and renal calculi.

## 2024-04-08 MED ORDER — MELOXICAM 15 MG PO TABS
15.0000 mg | ORAL_TABLET | Freq: Every day | ORAL | 1 refills | Status: DC
  Filled 2024-04-08: qty 30, 30d supply, fill #0
  Filled 2024-05-04: qty 30, 30d supply, fill #1

## 2024-04-08 NOTE — Progress Notes (Signed)
 Specialty Pharmacy Refill Coordination Note  Dylan [name] is a 63 y.o. male contacted today regarding refills of specialty medication(s) Enzalutamide  (XTANDI )   Patient requested Delivery   Delivery date: 04/11/24   Verified address: (Patient-Rptd) [address]   Medication will be filled on 04/10/24.    This fill date is pending response to refill request from provider. Patient is aware and if they have not received fill by intended date they must follow up with pharmacy.

## 2024-04-10 ENCOUNTER — Other Ambulatory Visit: Payer: Self-pay | Admitting: Hematology and Oncology

## 2024-04-10 ENCOUNTER — Other Ambulatory Visit: Payer: Self-pay

## 2024-04-10 DIAGNOSIS — C7951 Secondary malignant neoplasm of bone: Secondary | ICD-10-CM

## 2024-04-10 DIAGNOSIS — C61 Malignant neoplasm of prostate: Secondary | ICD-10-CM

## 2024-04-11 ENCOUNTER — Other Ambulatory Visit: Payer: Self-pay

## 2024-04-11 MED ORDER — ENZALUTAMIDE 40 MG PO TABS
160.0000 mg | ORAL_TABLET | Freq: Every day | ORAL | 0 refills | Status: DC
  Filled 2024-04-11: qty 120, 30d supply, fill #0

## 2024-04-17 ENCOUNTER — Other Ambulatory Visit (HOSPITAL_COMMUNITY): Payer: Self-pay

## 2024-04-17 ENCOUNTER — Other Ambulatory Visit: Payer: Self-pay | Admitting: Hematology and Oncology

## 2024-04-17 MED ORDER — OXYCODONE HCL 15 MG PO TABS
15.0000 mg | ORAL_TABLET | Freq: Four times a day (QID) | ORAL | 0 refills | Status: DC | PRN
  Filled 2024-04-17: qty 120, 30d supply, fill #0

## 2024-05-03 ENCOUNTER — Other Ambulatory Visit: Payer: Self-pay | Admitting: Hematology and Oncology

## 2024-05-03 ENCOUNTER — Other Ambulatory Visit: Payer: Self-pay | Admitting: Pharmacy Technician

## 2024-05-03 ENCOUNTER — Encounter (INDEPENDENT_AMBULATORY_CARE_PROVIDER_SITE_OTHER): Payer: Self-pay

## 2024-05-03 ENCOUNTER — Other Ambulatory Visit: Payer: Self-pay

## 2024-05-03 DIAGNOSIS — C7951 Secondary malignant neoplasm of bone: Secondary | ICD-10-CM

## 2024-05-03 NOTE — Progress Notes (Signed)
 Specialty Pharmacy Refill Coordination Note  Dylan [name] is a 63 y.o. male contacted today regarding refills of specialty medication(s)  Enzalutamide  (XTANDI )      Patient requested (Patient-Rptd) Delivery   Delivery date: 05/10/24 Verified address: (Patient-Rptd) [address]   Medication will be filled on 05/09/24.   RR sent to MD.

## 2024-05-04 MED ORDER — ENZALUTAMIDE 40 MG PO TABS
160.0000 mg | ORAL_TABLET | Freq: Every day | ORAL | 0 refills | Status: DC
  Filled 2024-05-06: qty 120, 30d supply, fill #0

## 2024-05-06 ENCOUNTER — Other Ambulatory Visit: Payer: Self-pay

## 2024-05-09 ENCOUNTER — Telehealth: Payer: Self-pay | Admitting: *Deleted

## 2024-05-09 NOTE — Telephone Encounter (Signed)
 Last office notes and most recent labs fax'd to [facility] for authorization of pt's Eligard

## 2024-05-17 ENCOUNTER — Other Ambulatory Visit: Payer: Self-pay | Admitting: Hematology and Oncology

## 2024-05-18 ENCOUNTER — Other Ambulatory Visit (HOSPITAL_COMMUNITY): Payer: Self-pay

## 2024-05-18 MED ORDER — OXYCODONE HCL 15 MG PO TABS
15.0000 mg | ORAL_TABLET | Freq: Four times a day (QID) | ORAL | 0 refills | Status: DC | PRN
  Filled 2024-05-18: qty 120, 30d supply, fill #0

## 2024-05-30 ENCOUNTER — Other Ambulatory Visit: Payer: Self-pay | Admitting: Pharmacy Technician

## 2024-05-30 ENCOUNTER — Other Ambulatory Visit: Payer: Self-pay

## 2024-05-30 ENCOUNTER — Encounter (INDEPENDENT_AMBULATORY_CARE_PROVIDER_SITE_OTHER): Payer: Self-pay

## 2024-05-30 ENCOUNTER — Other Ambulatory Visit: Payer: Self-pay | Admitting: Hematology and Oncology

## 2024-05-30 ENCOUNTER — Other Ambulatory Visit (HOSPITAL_COMMUNITY): Payer: Self-pay

## 2024-05-30 DIAGNOSIS — C61 Malignant neoplasm of prostate: Secondary | ICD-10-CM

## 2024-05-30 MED ORDER — ENZALUTAMIDE 40 MG PO TABS
160.0000 mg | ORAL_TABLET | Freq: Every day | ORAL | 0 refills | Status: DC
  Filled 2024-05-30: qty 120, 30d supply, fill #0

## 2024-05-30 NOTE — Progress Notes (Signed)
 Specialty Pharmacy Refill Coordination Note  Dylan [name] is a 63 y.o. male contacted today regarding refills of specialty medication(s)  Enzalutamide  (XTANDI )      Patient requested (Patient-Rptd) Delivery   Delivery date: 06/05/24 Verified address: (Patient-Rptd) [address]   Medication will be filled on 06/04/24.

## 2024-06-04 ENCOUNTER — Other Ambulatory Visit: Payer: Self-pay

## 2024-06-10 ENCOUNTER — Other Ambulatory Visit: Payer: Self-pay

## 2024-06-10 NOTE — Progress Notes (Signed)
 Specialty Pharmacy Ongoing Clinical Assessment Note  Dylan [name] is a 63 y.o. male who is being followed by the specialty pharmacy service for RxSp Oncology   Patient's specialty medication(s) reviewed today: Enzalutamide  (XTANDI )   Missed doses in the last 4 weeks: 0   Patient/Caregiver did not have any additional questions or concerns.   Therapeutic benefit summary: Patient is achieving benefit   Adverse events/side effects summary: No adverse events/side effects   Patient's therapy is appropriate to: Continue    Goals Addressed             This Visit's Progress    Slow Disease Progression   On track    Patient is on track. Patient will maintain adherence. PSA remains undetectable.          Follow up: 6 months  Alia Parsley M Lennix Rotundo Specialty Pharmacist

## 2024-06-13 ENCOUNTER — Other Ambulatory Visit (HOSPITAL_COMMUNITY): Payer: Self-pay

## 2024-06-15 ENCOUNTER — Other Ambulatory Visit: Payer: Self-pay | Admitting: Hematology and Oncology

## 2024-06-17 ENCOUNTER — Other Ambulatory Visit: Payer: Self-pay | Admitting: Hematology and Oncology

## 2024-06-17 ENCOUNTER — Telehealth: Payer: Self-pay | Admitting: *Deleted

## 2024-06-17 ENCOUNTER — Other Ambulatory Visit: Payer: Self-pay

## 2024-06-17 ENCOUNTER — Other Ambulatory Visit (HOSPITAL_COMMUNITY): Payer: Self-pay

## 2024-06-17 MED ORDER — OXYCODONE HCL 15 MG PO TABS
15.0000 mg | ORAL_TABLET | Freq: Four times a day (QID) | ORAL | 0 refills | Status: DC | PRN
  Filled 2024-06-17: qty 120, 30d supply, fill #0

## 2024-06-17 MED ORDER — MELOXICAM 15 MG PO TABS
15.0000 mg | ORAL_TABLET | Freq: Every day | ORAL | 1 refills | Status: DC
  Filled 2024-06-17: qty 30, 30d supply, fill #0
  Filled 2024-07-12: qty 30, 30d supply, fill #1

## 2024-06-17 NOTE — Telephone Encounter (Signed)
 Received call from pt requesting refill of his oxycodone  and his meloxicam .  Dr. Federico made aware.

## 2024-06-20 ENCOUNTER — Other Ambulatory Visit (HOSPITAL_COMMUNITY): Payer: Self-pay

## 2024-06-21 ENCOUNTER — Other Ambulatory Visit: Payer: Self-pay

## 2024-06-21 NOTE — Progress Notes (Signed)
 Clinical Intervention Note  Clinical Intervention Notes: Patient is requesting information on drug interactions between Xtandi  and Tylenol  Cold and Flu Severe liquid. Advised that based on the ingredients I have found of Acetaminophen , Dextromethorphan , Guaifenesin , and Phenylephrine, no DDIs are identified. Patient does not have product with him but will confirm when he returns home that it contains the same ingredients and will call back if further questions.   Clinical Intervention Outcomes: Prevention of an adverse drug event   Dylan [name] Specialty Pharmacist

## 2024-06-27 ENCOUNTER — Other Ambulatory Visit (HOSPITAL_COMMUNITY): Payer: Self-pay

## 2024-06-27 ENCOUNTER — Other Ambulatory Visit: Payer: Self-pay | Admitting: Hematology and Oncology

## 2024-06-27 ENCOUNTER — Inpatient Hospital Stay

## 2024-06-27 ENCOUNTER — Inpatient Hospital Stay: Attending: Hematology and Oncology | Admitting: Hematology and Oncology

## 2024-06-27 VITALS — BP 157/91 | HR 94 | Temp 97.1°F | Resp 16 | Wt 189.1 lb

## 2024-06-27 DIAGNOSIS — C61 Malignant neoplasm of prostate: Secondary | ICD-10-CM | POA: Diagnosis present

## 2024-06-27 DIAGNOSIS — C7951 Secondary malignant neoplasm of bone: Secondary | ICD-10-CM | POA: Insufficient documentation

## 2024-06-27 DIAGNOSIS — R52 Pain, unspecified: Secondary | ICD-10-CM | POA: Insufficient documentation

## 2024-06-27 DIAGNOSIS — M898X9 Other specified disorders of bone, unspecified site: Secondary | ICD-10-CM

## 2024-06-27 LAB — CBC WITH DIFFERENTIAL (CANCER CENTER ONLY)
Abs Immature Granulocytes: 0.04 K/uL (ref 0.00–0.07)
Basophils Absolute: 0 K/uL (ref 0.0–0.1)
Basophils Relative: 1 %
Eosinophils Absolute: 0.5 K/uL (ref 0.0–0.5)
Eosinophils Relative: 15 %
HCT: 39.5 % (ref 39.0–52.0)
Hemoglobin: 13.5 g/dL (ref 13.0–17.0)
Immature Granulocytes: 1 %
Lymphocytes Relative: 32 %
Lymphs Abs: 1.1 K/uL (ref 0.7–4.0)
MCH: 30.8 pg (ref 26.0–34.0)
MCHC: 34.2 g/dL (ref 30.0–36.0)
MCV: 90 fL (ref 80.0–100.0)
Monocytes Absolute: 0.4 K/uL (ref 0.1–1.0)
Monocytes Relative: 12 %
Neutro Abs: 1.4 K/uL — ABNORMAL LOW (ref 1.7–7.7)
Neutrophils Relative %: 39 %
Platelet Count: 154 K/uL (ref 150–400)
RBC: 4.39 MIL/uL (ref 4.22–5.81)
RDW: 13.5 % (ref 11.5–15.5)
WBC Count: 3.5 K/uL — ABNORMAL LOW (ref 4.0–10.5)
nRBC: 0 % (ref 0.0–0.2)

## 2024-06-27 LAB — CMP (CANCER CENTER ONLY)
ALT: 20 U/L (ref 0–44)
AST: 24 U/L (ref 15–41)
Albumin: 4.2 g/dL (ref 3.5–5.0)
Alkaline Phosphatase: 89 U/L (ref 38–126)
Anion gap: 8 (ref 5–15)
BUN: 10 mg/dL (ref 8–23)
CO2: 29 mmol/L (ref 22–32)
Calcium: 9.4 mg/dL (ref 8.9–10.3)
Chloride: 103 mmol/L (ref 98–111)
Creatinine: 0.97 mg/dL (ref 0.61–1.24)
GFR, Estimated: >60 mL/min (ref >60)
Glucose, Bld: 144 mg/dL — ABNORMAL HIGH (ref 70–99)
Potassium: 4.1 mmol/L (ref 3.5–5.1)
Sodium: 140 mmol/L (ref 135–145)
Total Bilirubin: 0.6 mg/dL (ref 0.0–1.2)
Total Protein: 7.5 g/dL (ref 6.5–8.1)

## 2024-06-27 LAB — PSA: Prostatic Specific Antigen: <0.02 ng/mL (ref 0.00–4.00)

## 2024-06-27 MED ORDER — AZITHROMYCIN 250 MG PO TABS
ORAL_TABLET | ORAL | 0 refills | Status: AC
  Filled 2024-06-27 – 2024-07-03 (×2): qty 6, 5d supply, fill #0

## 2024-06-27 NOTE — Progress Notes (Signed)
 [facility] [phone]   [phone]  PROGRESS NOTE Patient Care Team: Dylan [name] [name] Dylan [name] MADISON, MD as PCP - General (Hematology and Oncology) Vertell Pont, RN as Oncology Nurse Navigator Dylan [name] [name], Dylan [name] Dylan [name] MADISON, MD as Consulting Physician (Hematology and Oncology)  Hematological/Oncological History # Metastatic Castrate Sensitive Prostate Cancer  03/18/2022: start of Xtandi  160 mg PO daily.  07/06/2022: last visit with Dylan [name] [name]. Was on Eligard 30 mg q 4 months and Xtandi  160 mg  10/11/2022: establish care with Dylan. Federico   Interval History:  Dylan [name] [name] 63 y.o. male with medical history significant for Dylan [name] [name] who presents for a follow up visit. The patient's last visit was on 03/27/2024. In the interim since the last visit he has continued on eligard and Xtandi .  On exam today Mr. Dylan [name] [name] he has been well overall in interim since her last visit though he is having some fatigue.  He did get his Lupron shot this month at home.  He reports last month he is been more tired but [name] that his energy is about a 7 out of 10.  He reports he is tolerating his enzalutamide  pills well with no major difficulties.  He said he missed 1 dose about 2 weeks ago and very rarely misses a dose.  He is having no issues with nausea, Oni, or diarrhea.  He denies any fevers, chills, sweats or hot flashes.  He reports he does continue to have the pain in his left side for which he is taking about 4 oxycodone  a day.  He takes 1 in the early morning, 1 at lunch, 1 at 5 PM, and 1 before bed.  He reports the medication is working well to control his pain.  He reports he does pee a lot and gets up about 4-5 times per night.  He was on tamsulosin  at 1 point but discontinued it because he did not think it was helping.  His blood pressure is elevated but he attributes that to his salt intake.  He declines blood pressure medication at this time.  A full 10 point ROS is otherwise negative.   MEDICAL  HISTORY:  Past Medical History:  Diagnosis Date   Benign localized prostatic hyperplasia with lower urinary tract symptoms (LUTS)    followed by Dylan [name] [name]   Centrilobular emphysema (HCC)    Cholelithiasis    Chronic pain    due to bone mets,  left side rib, back, pelvis   CL (cirrhosis of liver) (HCC)    secondary to hx HCV, alcohol and drug abuse   ED (erectile dysfunction)    Hepatic cirrhosis (HCC)    History of COVID-19 06/2019   covid pneumonia w/ acute respriratory failure , hospital admission   History of drug abuse (HCC)    per pt last used drugs late 1980s stated was only marjiuana/ alcohol, however,  documentation in epic states also did acid/ cocaine/ iv drug use   History of hepatitis C    per gi note due to hx iv drug use (pt denies hx IV drug use) ;   treated in 2012 with resolution per last GI progress office note in epic 06-20-2018   Mixed restrictive and obstructive lung disease (HCC)    pulmologist--- Dylan [name] [name];   no oxygen   Moderate persistent asthma    followed by Dylan [name] [name]   Oxygen deficiency    PONV (postoperative nausea and vomiting)    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  primary urologist-- Dylan [name] [name]/  oncologist--- Dylan Dylan [name] [name];   pt dx 07/ 2023 via bone bx  lesion,  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 (left 4th rib/ L4)    SURGICAL HISTORY: Past Surgical History:  Procedure Laterality Date   COLONOSCOPY     COLONOSCOPY WITH PROPOFOL  N/A 12/11/2014   Procedure: COLONOSCOPY WITH PROPOFOL ;  Surgeon: Dylan JONETTA Aho, MD;  Location: [facility] ENDOSCOPY;  Service: Endoscopy;  Laterality: N/A;   ESOPHAGEAL BANDING N/A 12/11/2014   Procedure: ESOPHAGEAL BANDING;  Surgeon: Dylan JONETTA Aho, MD;  Location: [facility] ENDOSCOPY;  Service: Endoscopy;  Laterality: N/A;   ESOPHAGOGASTRODUODENOSCOPY N/A 06/06/2017   Procedure: ESOPHAGOGASTRODUODENOSCOPY (EGD);  Surgeon: Dylan Elspeth SQUIBB, MD;  Location: [facility];  Service: Gastroenterology;  Laterality:  N/A;   ESOPHAGOGASTRODUODENOSCOPY (EGD) WITH PROPOFOL  N/A 12/11/2014   Procedure: ESOPHAGOGASTRODUODENOSCOPY (EGD) WITH PROPOFOL ;  Surgeon: Dylan JONETTA Aho, MD;  Location: [facility] ENDOSCOPY;  Service: Endoscopy;  Laterality: N/A;   GOLD SEED IMPLANT N/A 05/19/2022   Procedure: GOLD SEED IMPLANT;  Surgeon: Dylan [name] Senior, MD;  Location: [facility];  Service: Urology;  Laterality: N/A;  ONLY NEEDS 30 MIN   KNEE ARTHROSCOPY Right    1980s   SPACE OAR INSTILLATION N/A 05/19/2022   Procedure: SPACE OAR INSTILLATION;  Surgeon: Dylan [name] Senior, MD;  Location: [facility];  Service: Urology;  Laterality: N/A;   UPPER GASTROINTESTINAL ENDOSCOPY      SOCIAL HISTORY: Social History   Socioeconomic History   Marital status: Divorced    Spouse name: Not on file   Number of children: 0   Years of education: Not on file   Highest education level: Not on file  Occupational History   Occupation: investment banker, corporate  Tobacco Use   Smoking status: Former    Current packs/day: 0.00    Average packs/day: 0.5 packs/day for 40.0 years (20.0 ttl pk-yrs)    Types: Cigarettes    Start date: 77    Quit date: 2015    Years since quitting: 10.8   Smokeless tobacco: Never  Vaping Use   Vaping status: Never Used  Substance and Sexual Activity   Alcohol use: Not Currently    Alcohol/week: 2.0 standard drinks of alcohol    Types: 2 Standard drinks or equivalent per week    Comment: 05-05-2022  per pt last alcohol 07/ 2023, hx abuse   Drug use: Not Currently    Comment: 05-05-2022  per pt last used drugs late 1980s - marijuana, acid (denies cocoaine / or IV drug, however there is documentation in epic )   Sexual activity: Yes  Other Topics Concern   Not on file  Social History Narrative   Not on file   Social Drivers of Health   Financial Resource Strain: Medium Risk (03/14/2022)   Overall Financial Resource Strain (CARDIA)    Difficulty of Paying Living Expenses:  Somewhat hard  Food Insecurity: Food Insecurity Present (03/14/2022)   Hunger Vital Sign    Worried About Running Out of Food in the Last Year: Sometimes true    Ran Out of Food in the Last Year: Sometimes true  Transportation Needs: No Transportation Needs (03/14/2022)   PRAPARE - Administrator, Civil Service (Medical): No    Lack of Transportation (Non-Medical): No  Physical Activity: Not on file  Stress: Not on file  Social Connections: Not on file  Intimate Partner Violence: Not on file    FAMILY HISTORY: Family History  Problem Relation Age of  Onset   Diabetes Mother    Alzheimer's disease Mother    Rectal cancer Father    Mesothelioma Father    Diabetes Maternal Grandmother    Colon cancer Neg Hx    Stomach cancer Neg Hx    Esophageal cancer Neg Hx     ALLERGIES:  is allergic to other.  MEDICATIONS:  Current Outpatient Medications  Medication Sig Dispense Refill   albuterol  (VENTOLIN  HFA) 108 (90 Base) MCG/ACT inhaler Inhale 2 puffs into the lungs every 4 (four) hours as needed for wheezing or shortness of breath. 6.7 g 0   enzalutamide  (XTANDI ) 40 MG tablet Take 4 tablets (160 mg total) by mouth daily. 120 tablet 0   enzalutamide  (XTANDI ) 40 MG tablet Take 4 tablets (160 mg total) by mouth daily. 120 tablet 0   gabapentin  (NEURONTIN ) 300 MG capsule Take 1 capsule (300 mg total) by mouth at bedtime. 30 capsule 3   meloxicam  (MOBIC ) 15 MG tablet Take 1 tablet (15 mg total) by mouth daily. 30 tablet 1   oxyCODONE  (ROXICODONE ) 15 MG immediate release tablet Take 1 tablet (15 mg total) by mouth every 6 (six) hours as needed. 120 tablet 0   sildenafil  (VIAGRA ) 100 MG tablet Take 1 tablet (100 mg total) by mouth daily as needed for erectile dysfunction. 30 tablet 0   triamcinolone  ointment (KENALOG ) 0.1 % Apply to legs twice a day for 2 weeks then stop. Can use again for flares. 454 g 2   No current facility-administered medications for this visit.    REVIEW OF  SYSTEMS:   Constitutional: ( - ) fevers, ( - )  chills , ( - ) night sweats Eyes: ( - ) blurriness of vision, ( - ) double vision, ( - ) watery eyes Ears, nose, mouth, throat, and face: ( - ) mucositis, ( - ) sore throat Respiratory: ( - ) cough, ( - ) dyspnea, ( - ) wheezes Cardiovascular: ( - ) palpitation, ( - ) chest discomfort, ( - ) lower extremity swelling Gastrointestinal:  ( - ) nausea, ( - ) heartburn, ( - ) change in bowel habits Skin: ( - ) abnormal skin rashes Lymphatics: ( - ) new lymphadenopathy, ( - ) easy bruising Neurological: ( - ) numbness, ( - ) tingling, ( - ) new weaknesses Behavioral/Psych: ( - ) mood change, ( - ) new changes  All other systems were reviewed with the patient and are negative.  PHYSICAL EXAMINATION: Not performed due to virtual visit.   LABORATORY DATA:  I have reviewed the data as listed    Latest Ref Rng & Units 03/27/2024    2:30 PM 12/14/2023    4:13 PM 09/13/2023    3:52 PM  CBC  WBC 4.0 - 10.5 K/uL 3.5  3.4  3.7   Hemoglobin 13.0 - 17.0 g/dL 86.7  86.6  86.5   Hematocrit 39.0 - 52.0 % 39.2  38.3  39.2   Platelets 150 - 400 K/uL 167  168  155        Latest Ref Rng & Units 03/27/2024    2:30 PM 12/14/2023    4:13 PM 09/13/2023    3:52 PM  CMP  Glucose 70 - 99 mg/dL 899  878  816   BUN 8 - 23 mg/dL 11  15  12    Creatinine 0.61 - 1.24 mg/dL 8.99  8.90  8.88   Sodium 135 - 145 mmol/L 143  141  139   Potassium 3.5 -  5.1 mmol/L 4.5  4.1  3.7   Chloride 98 - 111 mmol/L 107  109  105   CO2 22 - 32 mmol/L 32  27  29   Calcium 8.9 - 10.3 mg/dL 9.4  9.3  9.4   Total Protein 6.5 - 8.1 g/dL 7.4  7.6  7.4   Total Bilirubin 0.0 - 1.2 mg/dL 0.7  0.8  0.6   Alkaline Phos 38 - 126 U/L 91  84  81   AST 15 - 41 U/L 20  25  21    ALT 0 - 44 U/L 16  23  20      Lab Results  Component Value Date   MPROTEIN Not Observed 02/11/2022   Lab Results  Component Value Date   KPAFRELGTCHN 23.5 (H) 02/11/2022   LAMBDASER 13.8 02/11/2022   KAPLAMBRATIO  1.70 (H) 02/11/2022    RADIOGRAPHIC STUDIES: No results found.  ASSESSMENT & PLAN Dylan [name] [name] is a 63 y.o. male with medical history significant for [facility] who presents for a follow up visit.  # Metastatic Castrate Resistant Prostate Cancer --continue Xtandi  160 mg PO daily -- continue leuprolide 30 mg q 4 months. This will need to be continued indefinitely.  The patient receives the shot at home.  He last received his shot in Oct 2025.  Next due in Feb 2026 --labs at each visit to include CBC, CMP, Testosterone , and PSA -- s/p radiation therapy to bone metastasis and lymph nodes.  --labs today show white blood cell 3.5, hemoglobin 13.5, MCV 90, platelets 154 -- Return to clinic in 3 months time to continue monitoring  # Pain Control --patient currently on Oxycodone  15mg  PO q 6 H PRN --patient [name] his pain is manageable and he is taking up to 4-5 oxycodone  per day -- Patient was taking Xtampza  but discontinued it due to nausea and vomiting. -- pain in left rib is stable on his current pain regimen. -- If continued issues with pain control will consult palliative care.  No orders of the defined types were placed in this encounter.  All questions were answered. The patient knows to call the clinic with any problems, questions or concerns.  A total of more than 30 minutes were spent on this encounter with  non-face-to-face time, including preparing to see the patient, ordering tests and/or medications, counseling the patient and coordination of care as outlined above.   Dylan [name] IVAR Kidney, MD Department of Hematology/Oncology [facility] Phone: [phone] Pager: [phone] Email: [name]  06/27/2024 12:32 PM

## 2024-06-28 LAB — TESTOSTERONE: Testosterone: 36 ng/dL — ABNORMAL LOW (ref 264–916)

## 2024-07-03 ENCOUNTER — Other Ambulatory Visit (HOSPITAL_COMMUNITY): Payer: Self-pay

## 2024-07-03 ENCOUNTER — Encounter: Payer: Self-pay | Admitting: Hematology and Oncology

## 2024-07-04 ENCOUNTER — Other Ambulatory Visit (HOSPITAL_COMMUNITY): Payer: Self-pay

## 2024-07-04 ENCOUNTER — Encounter: Payer: Self-pay | Admitting: Pharmacist

## 2024-07-04 ENCOUNTER — Other Ambulatory Visit: Payer: Self-pay

## 2024-07-06 ENCOUNTER — Other Ambulatory Visit (HOSPITAL_COMMUNITY): Payer: Self-pay

## 2024-07-06 ENCOUNTER — Encounter: Payer: Self-pay | Admitting: Hematology and Oncology

## 2024-07-09 ENCOUNTER — Other Ambulatory Visit: Payer: Self-pay

## 2024-07-11 ENCOUNTER — Other Ambulatory Visit: Payer: Self-pay | Admitting: Hematology and Oncology

## 2024-07-11 ENCOUNTER — Other Ambulatory Visit: Payer: Self-pay

## 2024-07-11 ENCOUNTER — Other Ambulatory Visit (HOSPITAL_COMMUNITY): Payer: Self-pay

## 2024-07-11 DIAGNOSIS — C61 Malignant neoplasm of prostate: Secondary | ICD-10-CM

## 2024-07-11 NOTE — Progress Notes (Signed)
 Specialty Pharmacy Refill Coordination Note  MyChart Questionnaire Submission  Dylan [name] is a 63 y.o. male contacted today regarding refills of specialty medication(s) Xtandi .  Doses on hand: (Patient-Rptd) 11 doses   Patient requested: (Patient-Rptd) Delivery   Delivery date: 07/17/24  Verified address: [address] [place] [address]  Medication will be filled on 07/16/24.   This fill date is pending response to refill request from provider. Patient is aware and if they have not received fill by intended date, they must follow up with pharmacy.

## 2024-07-12 ENCOUNTER — Other Ambulatory Visit (HOSPITAL_COMMUNITY): Payer: Self-pay

## 2024-07-12 ENCOUNTER — Other Ambulatory Visit: Payer: Self-pay | Admitting: Hematology and Oncology

## 2024-07-12 ENCOUNTER — Other Ambulatory Visit: Payer: Self-pay

## 2024-07-12 DIAGNOSIS — C7951 Secondary malignant neoplasm of bone: Secondary | ICD-10-CM

## 2024-07-12 MED ORDER — ENZALUTAMIDE 40 MG PO TABS
160.0000 mg | ORAL_TABLET | Freq: Every day | ORAL | 0 refills | Status: DC
  Filled 2024-07-12: qty 120, 30d supply, fill #0

## 2024-07-15 ENCOUNTER — Other Ambulatory Visit: Payer: Self-pay

## 2024-07-16 ENCOUNTER — Other Ambulatory Visit: Payer: Self-pay

## 2024-07-18 ENCOUNTER — Other Ambulatory Visit: Payer: Self-pay | Admitting: Hematology and Oncology

## 2024-07-19 ENCOUNTER — Other Ambulatory Visit: Payer: Self-pay

## 2024-07-19 ENCOUNTER — Other Ambulatory Visit (HOSPITAL_COMMUNITY): Payer: Self-pay

## 2024-07-19 MED ORDER — OXYCODONE HCL 15 MG PO TABS
15.0000 mg | ORAL_TABLET | Freq: Four times a day (QID) | ORAL | 0 refills | Status: DC | PRN
  Filled 2024-07-19: qty 120, 30d supply, fill #0

## 2024-07-22 ENCOUNTER — Encounter: Payer: Self-pay | Admitting: Oncology

## 2024-08-07 ENCOUNTER — Other Ambulatory Visit: Payer: Self-pay

## 2024-08-07 ENCOUNTER — Other Ambulatory Visit: Payer: Self-pay | Admitting: Hematology and Oncology

## 2024-08-07 DIAGNOSIS — C61 Malignant neoplasm of prostate: Secondary | ICD-10-CM

## 2024-08-08 ENCOUNTER — Other Ambulatory Visit: Payer: Self-pay

## 2024-08-08 ENCOUNTER — Other Ambulatory Visit (HOSPITAL_COMMUNITY): Payer: Self-pay

## 2024-08-08 NOTE — Progress Notes (Signed)
 Specialty Pharmacy Refill Coordination Note  MyChart Questionnaire Submission  Dylan [name] is a 63 y.o. male contacted today regarding refills of specialty medication(s) Xtandi .  Doses on hand: 14 (56 tabs)  Patient requested: (Patient-Rptd) Delivery   Delivery date: 08/16/24  Verified address: [address] [place] [address]  Medication will be filled on 08/15/24

## 2024-08-09 ENCOUNTER — Other Ambulatory Visit: Payer: Self-pay

## 2024-08-09 ENCOUNTER — Other Ambulatory Visit (HOSPITAL_COMMUNITY): Payer: Self-pay

## 2024-08-09 MED ORDER — ENZALUTAMIDE 40 MG PO TABS
160.0000 mg | ORAL_TABLET | Freq: Every day | ORAL | 0 refills | Status: DC
  Filled 2024-08-09: qty 120, 30d supply, fill #0

## 2024-08-11 ENCOUNTER — Other Ambulatory Visit: Payer: Self-pay | Admitting: Hematology and Oncology

## 2024-08-11 ENCOUNTER — Other Ambulatory Visit (HOSPITAL_COMMUNITY): Payer: Self-pay

## 2024-08-11 MED ORDER — MELOXICAM 15 MG PO TABS
15.0000 mg | ORAL_TABLET | Freq: Every day | ORAL | 1 refills | Status: AC
  Filled 2024-08-11: qty 30, 30d supply, fill #0
  Filled 2024-09-08: qty 30, 30d supply, fill #1

## 2024-08-13 ENCOUNTER — Other Ambulatory Visit (HOSPITAL_COMMUNITY): Payer: Self-pay

## 2024-08-13 ENCOUNTER — Other Ambulatory Visit: Payer: Self-pay

## 2024-08-15 ENCOUNTER — Other Ambulatory Visit: Payer: Self-pay | Admitting: Physician Assistant

## 2024-08-15 ENCOUNTER — Other Ambulatory Visit (HOSPITAL_BASED_OUTPATIENT_CLINIC_OR_DEPARTMENT_OTHER): Payer: Self-pay

## 2024-08-15 ENCOUNTER — Telehealth: Payer: Self-pay | Admitting: *Deleted

## 2024-08-15 ENCOUNTER — Other Ambulatory Visit: Payer: Self-pay

## 2024-08-15 MED ORDER — OXYCODONE HCL 15 MG PO TABS
15.0000 mg | ORAL_TABLET | Freq: Four times a day (QID) | ORAL | 0 refills | Status: DC | PRN
  Filled 2024-08-15 – 2024-08-16 (×2): qty 120, 30d supply, fill #0

## 2024-08-15 NOTE — Telephone Encounter (Signed)
 Received call from pt requesting refill of his oxycodone . Last filled on 07/19/24 for #120. Filled at [facility] [place] [employer], PA-C  made aware.

## 2024-08-16 ENCOUNTER — Other Ambulatory Visit: Payer: Self-pay

## 2024-08-16 ENCOUNTER — Other Ambulatory Visit (HOSPITAL_COMMUNITY): Payer: Self-pay

## 2024-08-26 ENCOUNTER — Encounter: Payer: Self-pay | Admitting: *Deleted

## 2024-09-06 ENCOUNTER — Other Ambulatory Visit: Payer: Self-pay

## 2024-09-06 ENCOUNTER — Other Ambulatory Visit: Payer: Self-pay | Admitting: Hematology and Oncology

## 2024-09-06 DIAGNOSIS — C61 Malignant neoplasm of prostate: Secondary | ICD-10-CM

## 2024-09-08 ENCOUNTER — Encounter: Payer: Self-pay | Admitting: Hematology and Oncology

## 2024-09-08 MED ORDER — ENZALUTAMIDE 40 MG PO TABS
160.0000 mg | ORAL_TABLET | Freq: Every day | ORAL | 0 refills | Status: AC
  Filled 2024-09-09: qty 120, 30d supply, fill #0

## 2024-09-09 ENCOUNTER — Encounter (INDEPENDENT_AMBULATORY_CARE_PROVIDER_SITE_OTHER): Payer: Self-pay

## 2024-09-09 ENCOUNTER — Other Ambulatory Visit: Payer: Self-pay

## 2024-09-10 ENCOUNTER — Other Ambulatory Visit (HOSPITAL_COMMUNITY): Payer: Self-pay

## 2024-09-10 NOTE — Progress Notes (Signed)
 Specialty Pharmacy Refill Coordination Note  MyChart Questionnaire Submission  Dylan [name] is a 64 y.o. male contacted today regarding refills of specialty medication(s) Xtandi .  Doses on hand: (Patient-Rptd) 14 doses   Patient requested: (Patient-Rptd) Delivery   Delivery date: 09/17/24  Verified address: [address] [place] [address]  Medication will be filled on 09/16/24

## 2024-09-16 ENCOUNTER — Other Ambulatory Visit: Payer: Self-pay | Admitting: Physician Assistant

## 2024-09-16 ENCOUNTER — Other Ambulatory Visit: Payer: Self-pay

## 2024-09-17 ENCOUNTER — Other Ambulatory Visit: Payer: Self-pay

## 2024-09-17 ENCOUNTER — Encounter: Payer: Self-pay | Admitting: Hematology and Oncology

## 2024-09-17 ENCOUNTER — Other Ambulatory Visit: Payer: Self-pay | Admitting: Hematology and Oncology

## 2024-09-17 ENCOUNTER — Telehealth: Payer: Self-pay

## 2024-09-17 MED ORDER — OXYCODONE HCL 15 MG PO TABS
15.0000 mg | ORAL_TABLET | Freq: Four times a day (QID) | ORAL | 0 refills | Status: AC | PRN
  Filled 2024-09-17 – 2024-09-18 (×2): qty 120, 30d supply, fill #0

## 2024-09-17 NOTE — Telephone Encounter (Signed)
 TC to pt to confirm that Dr Federico has refilled his pain medication as requested. Pt voiced understanding.

## 2024-09-18 ENCOUNTER — Other Ambulatory Visit (HOSPITAL_COMMUNITY): Payer: Self-pay

## 2024-09-18 ENCOUNTER — Other Ambulatory Visit: Payer: Self-pay

## 2024-09-19 ENCOUNTER — Other Ambulatory Visit (HOSPITAL_COMMUNITY): Payer: Self-pay

## 2024-09-19 ENCOUNTER — Other Ambulatory Visit (HOSPITAL_BASED_OUTPATIENT_CLINIC_OR_DEPARTMENT_OTHER): Payer: Self-pay

## 2024-09-26 ENCOUNTER — Encounter: Payer: Self-pay | Admitting: Hematology and Oncology

## 2024-10-02 ENCOUNTER — Other Ambulatory Visit: Payer: Self-pay | Admitting: Hematology and Oncology

## 2024-10-02 DIAGNOSIS — C61 Malignant neoplasm of prostate: Secondary | ICD-10-CM

## 2024-10-02 NOTE — Progress Notes (Unsigned)
 " [facility] [phone]   [phone]  PROGRESS NOTE Patient Care Team: Dylan Dylan [name], MD as PCP - General (Hematology and Oncology) Dylan Pont, RN as Oncology Nurse Navigator Dylan, Dylan [name], MD as Consulting Physician (Hematology and Oncology)  Hematological/Oncological History # Metastatic Castrate Sensitive Prostate Cancer  03/18/2022: start of Xtandi  160 mg PO daily.  07/06/2022: last visit with Dr. Amadeo. Was on Eligard 30 mg q 4 months and Xtandi  160 mg  10/11/2022: establish care with Dr. Federico   Interval History:  Dylan [name] 64 y.o. male with medical history significant for [facility] who presents for a follow up visit. The patient's last visit was on 03/27/2024. In the interim since the last visit he has continued on eligard and Xtandi .  On exam today [name] notes he has been well overall in interim since her last visit though he is having some fatigue.  He did get his Lupron shot this month at home.  He reports last month he is been more tired but notes that his energy is about a 7 out of 10.  He reports he is tolerating his enzalutamide  pills well with no major difficulties.  He said he missed 1 dose about 2 weeks ago and very rarely misses a dose.  He is having no issues with nausea, Oni, or diarrhea.  He denies any fevers, chills, sweats or hot flashes.  He reports he does continue to have the pain in his left side for which he is taking about 4 oxycodone  a day.  He takes 1 in the early morning, 1 at lunch, 1 at 5 PM, and 1 before bed.  He reports the medication is working well to control his pain.  He reports he does pee a lot and gets up about 4-5 times per night.  He was on tamsulosin  at 1 point but discontinued it because he did not think it was helping.  His blood pressure is elevated but he attributes that to his salt intake.  He declines blood pressure medication at this time.  A full 10 point ROS is otherwise negative.   MEDICAL  HISTORY:  Past Medical History:  Diagnosis Date   Benign localized prostatic hyperplasia with lower urinary tract symptoms (LUTS)    followed by dr gay   Centrilobular emphysema (HCC)    Cholelithiasis    Chronic pain    due to bone mets,  left side rib, back, pelvis   CL (cirrhosis of liver) (HCC)    secondary to hx HCV, alcohol and drug abuse   ED (erectile dysfunction)    Hepatic cirrhosis (HCC)    History of COVID-19 06/2019   covid pneumonia w/ acute respriratory failure , hospital admission   History of drug abuse (HCC)    per pt last used drugs late 1980s stated was only marjiuana/ alcohol, however,  documentation in epic states also did acid/ cocaine/ iv drug use   History of hepatitis C    per gi note due to hx iv drug use (pt denies hx IV drug use) ;   treated in 2012 with resolution per last GI progress office note in epic 06-20-2018   Mixed restrictive and obstructive lung disease (HCC)    pulmologist--- dr brenna;   no oxygen   Moderate persistent asthma    followed by dr icard   Oxygen deficiency    PONV (postoperative nausea and vomiting)    Prostate cancer metastatic to bone (HCC)  03/08/2022   primary urologist-- dr gay/  oncologist--- dr amadeo;   pt dx 07/ 2023 via bone bx  lesion,  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 (left 4th rib/ L4)    SURGICAL HISTORY: Past Surgical History:  Procedure Laterality Date   COLONOSCOPY     COLONOSCOPY WITH PROPOFOL  N/A 12/11/2014   Procedure: COLONOSCOPY WITH PROPOFOL ;  Surgeon: Lamar JONETTA Aho, MD;  Location: [facility] ENDOSCOPY;  Service: Endoscopy;  Laterality: N/A;   ESOPHAGEAL BANDING N/A 12/11/2014   Procedure: ESOPHAGEAL BANDING;  Surgeon: Lamar JONETTA Aho, MD;  Location: [facility] ENDOSCOPY;  Service: Endoscopy;  Laterality: N/A;   ESOPHAGOGASTRODUODENOSCOPY N/A 06/06/2017   Procedure: ESOPHAGOGASTRODUODENOSCOPY (EGD);  Surgeon: Leigh Elspeth SQUIBB, MD;  Location: [facility];  Service: Gastroenterology;  Laterality:  N/A;   ESOPHAGOGASTRODUODENOSCOPY (EGD) WITH PROPOFOL  N/A 12/11/2014   Procedure: ESOPHAGOGASTRODUODENOSCOPY (EGD) WITH PROPOFOL ;  Surgeon: Lamar JONETTA Aho, MD;  Location: [facility] ENDOSCOPY;  Service: Endoscopy;  Laterality: N/A;   GOLD SEED IMPLANT N/A 05/19/2022   Procedure: GOLD SEED IMPLANT;  Surgeon: Matilda Senior, MD;  Location: [facility];  Service: Urology;  Laterality: N/A;  ONLY NEEDS 30 MIN   KNEE ARTHROSCOPY Right    1980s   SPACE OAR INSTILLATION N/A 05/19/2022   Procedure: SPACE OAR INSTILLATION;  Surgeon: Matilda Senior, MD;  Location: [facility];  Service: Urology;  Laterality: N/A;   UPPER GASTROINTESTINAL ENDOSCOPY      SOCIAL HISTORY: Social History   Socioeconomic History   Marital status: Divorced    Spouse name: Not on file   Number of children: 0   Years of education: Not on file   Highest education level: Not on file  Occupational History   Occupation: investment banker, corporate  Tobacco Use   Smoking status: Former    Current packs/day: 0.00    Average packs/day: 0.5 packs/day for 40.0 years (20.0 ttl pk-yrs)    Types: Cigarettes    Start date: 40    Quit date: 2015    Years since quitting: 11.1   Smokeless tobacco: Never  Vaping Use   Vaping status: Never Used  Substance and Sexual Activity   Alcohol use: Not Currently    Alcohol/week: 2.0 standard drinks of alcohol    Types: 2 Standard drinks or equivalent per week    Comment: 05-05-2022  per pt last alcohol 07/ 2023, hx abuse   Drug use: Not Currently    Comment: 05-05-2022  per pt last used drugs late 1980s - marijuana, acid (denies cocoaine / or IV drug, however there is documentation in epic )   Sexual activity: Yes  Other Topics Concern   Not on file  Social History Narrative   Not on file   Social Drivers of Health   Tobacco Use: Medium Risk (10/18/2023)   Patient History    Smoking Tobacco Use: Former    Smokeless Tobacco Use: Never    Passive Exposure:  Not on file  Financial Resource Strain: Medium Risk (03/14/2022)   Overall Financial Resource Strain (CARDIA)    Difficulty of Paying Living Expenses: Somewhat hard  Food Insecurity: Food Insecurity Present (03/14/2022)   Hunger Vital Sign    Worried About Running Out of Food in the Last Year: Sometimes true    Ran Out of Food in the Last Year: Sometimes true  Transportation Needs: No Transportation Needs (03/14/2022)   PRAPARE - Administrator, Civil Service (Medical): No    Lack of Transportation (Non-Medical): No  Physical Activity: Not on file  Stress: Not on file  Social Connections: Not on file  Intimate Partner Violence: Not on file  Depression (EYV7-0): Not on file  Alcohol Screen: Not on file  Housing: Low Risk (03/14/2022)   Housing    Last Housing Risk Score: 0  Utilities: Not on file  Health Literacy: Not on file    FAMILY HISTORY: Family History  Problem Relation Age of Onset   Diabetes Mother    Alzheimer's disease Mother    Rectal cancer Father    Mesothelioma Father    Diabetes Maternal Grandmother    Colon cancer Neg Hx    Stomach cancer Neg Hx    Esophageal cancer Neg Hx     ALLERGIES:  is allergic to other.  MEDICATIONS:  Current Outpatient Medications  Medication Sig Dispense Refill   albuterol  (VENTOLIN  HFA) 108 (90 Base) MCG/ACT inhaler Inhale 2 puffs into the lungs every 4 (four) hours as needed for wheezing or shortness of breath. 6.7 g 0   azithromycin  (ZITHROMAX  Z-PAK) 250 MG tablet Take 2 tablets by mouth today then 1 tablet once daily for a total of 5 days. 6 each 0   enzalutamide  (XTANDI ) 40 MG tablet Take 4 tablets (160 mg total) by mouth daily. 120 tablet 0   enzalutamide  (XTANDI ) 40 MG tablet Take 4 tablets (160 mg total) by mouth daily. 120 tablet 0   gabapentin  (NEURONTIN ) 300 MG capsule Take 1 capsule (300 mg total) by mouth at bedtime. 30 capsule 3   meloxicam  (MOBIC ) 15 MG tablet Take 1 tablet (15 mg total) by mouth daily. 30  tablet 1   oxyCODONE  (ROXICODONE ) 15 MG immediate release tablet Take 1 tablet (15 mg total) by mouth every 6 (six) hours as needed. 120 tablet 0   sildenafil  (VIAGRA ) 100 MG tablet Take 1 tablet (100 mg total) by mouth daily as needed for erectile dysfunction. 30 tablet 0   triamcinolone  ointment (KENALOG ) 0.1 % Apply to legs twice a day for 2 weeks then stop. Can use again for flares. 454 g 2   No current facility-administered medications for this visit.    REVIEW OF SYSTEMS:   Constitutional: ( - ) fevers, ( - )  chills , ( - ) night sweats Eyes: ( - ) blurriness of vision, ( - ) double vision, ( - ) watery eyes Ears, nose, mouth, throat, and face: ( - ) mucositis, ( - ) sore throat Respiratory: ( - ) cough, ( - ) dyspnea, ( - ) wheezes Cardiovascular: ( - ) palpitation, ( - ) chest discomfort, ( - ) lower extremity swelling Gastrointestinal:  ( - ) nausea, ( - ) heartburn, ( - ) change in bowel habits Skin: ( - ) abnormal skin rashes Lymphatics: ( - ) new lymphadenopathy, ( - ) easy bruising Neurological: ( - ) numbness, ( - ) tingling, ( - ) new weaknesses Behavioral/Psych: ( - ) mood change, ( - ) new changes  All other systems were reviewed with the patient and are negative.  PHYSICAL EXAMINATION: Not performed due to virtual visit.   LABORATORY DATA:  I have reviewed the data as listed    Latest Ref Rng & Units 06/27/2024    2:16 PM 03/27/2024    2:30 PM 12/14/2023    4:13 PM  CBC  WBC 4.0 - 10.5 K/uL 3.5  3.5  3.4   Hemoglobin 13.0 - 17.0 g/dL 86.4  86.7  86.6   Hematocrit 39.0 -  52.0 % 39.5  39.2  38.3   Platelets 150 - 400 K/uL 154  167  168        Latest Ref Rng & Units 06/27/2024    2:16 PM 03/27/2024    2:30 PM 12/14/2023    4:13 PM  CMP  Glucose 70 - 99 mg/dL 855  899  878   BUN 8 - 23 mg/dL 10  11  15    Creatinine 0.61 - 1.24 mg/dL 9.02  8.99  8.90   Sodium 135 - 145 mmol/L 140  143  141   Potassium 3.5 - 5.1 mmol/L 4.1  4.5  4.1   Chloride 98 - 111 mmol/L  103  107  109   CO2 22 - 32 mmol/L 29  32  27   Calcium 8.9 - 10.3 mg/dL 9.4  9.4  9.3   Total Protein 6.5 - 8.1 g/dL 7.5  7.4  7.6   Total Bilirubin 0.0 - 1.2 mg/dL 0.6  0.7  0.8   Alkaline Phos 38 - 126 U/L 89  91  84   AST 15 - 41 U/L 24  20  25    ALT 0 - 44 U/L 20  16  23      Lab Results  Component Value Date   MPROTEIN Not Observed 02/11/2022   Lab Results  Component Value Date   KPAFRELGTCHN 23.5 (H) 02/11/2022   LAMBDASER 13.8 02/11/2022   KAPLAMBRATIO 1.70 (H) 02/11/2022    RADIOGRAPHIC STUDIES: No results found.  ASSESSMENT & PLAN Dylan [name] is a 64 y.o. male with medical history significant for [facility] who presents for a follow up visit.  # Metastatic Castrate Resistant Prostate Cancer --continue Xtandi  160 mg PO daily -- continue leuprolide 30 mg q 4 months. This will need to be continued indefinitely.  The patient receives the shot at home.  He last received his shot in Oct 2025.  Next due in Feb 2026 --labs at each visit to include CBC, CMP, Testosterone , and PSA -- s/p radiation therapy to bone metastasis and lymph nodes.  --labs today show white blood cell 3.5, hemoglobin 13.5, MCV 90, platelets 154 -- Return to clinic in 3 months time to continue monitoring  # Pain Control --patient currently on Oxycodone  15mg  PO q 6 H PRN --patient notes his pain is manageable and he is taking up to 4-5 oxycodone  per day -- Patient was taking Xtampza  but discontinued it due to nausea and vomiting. -- pain in left rib is stable on his current pain regimen. -- If continued issues with pain control will consult palliative care.  No orders of the defined types were placed in this encounter.  All questions were answered. The patient knows to call the clinic with any problems, questions or concerns.  A total of more than 30 minutes were spent on this encounter with  non-face-to-face time, including preparing to see the patient, ordering tests and/or medications, counseling  the patient and coordination of care as outlined above.   Dylan IVAR Kidney, MD Department of Hematology/Oncology [facility] at [facility] Phone: [phone] Pager: [phone] Email: [email or web address]  10/02/2024 8:09 PM  "

## 2024-10-03 ENCOUNTER — Inpatient Hospital Stay: Payer: Self-pay | Admitting: Hematology and Oncology

## 2024-10-03 ENCOUNTER — Inpatient Hospital Stay: Payer: Self-pay

## 2024-10-03 ENCOUNTER — Encounter: Payer: Self-pay | Admitting: Hematology and Oncology

## 2024-10-03 VITALS — BP 156/90 | HR 95 | Temp 97.3°F | Resp 18 | Wt 189.9 lb

## 2024-10-03 DIAGNOSIS — C61 Malignant neoplasm of prostate: Secondary | ICD-10-CM

## 2024-10-03 DIAGNOSIS — M898X9 Other specified disorders of bone, unspecified site: Secondary | ICD-10-CM

## 2024-10-03 LAB — CMP (CANCER CENTER ONLY)
ALT: 18 U/L (ref 0–44)
AST: 23 U/L (ref 15–41)
Albumin: 4.4 g/dL (ref 3.5–5.0)
Alkaline Phosphatase: 92 U/L (ref 38–126)
Anion gap: 11 (ref 5–15)
BUN: 11 mg/dL (ref 8–23)
CO2: 27 mmol/L (ref 22–32)
Calcium: 9.5 mg/dL (ref 8.9–10.3)
Chloride: 104 mmol/L (ref 98–111)
Creatinine: 0.94 mg/dL (ref 0.61–1.24)
GFR, Estimated: >60 mL/min
Glucose, Bld: 82 mg/dL (ref 70–99)
Potassium: 3.9 mmol/L (ref 3.5–5.1)
Sodium: 141 mmol/L (ref 135–145)
Total Bilirubin: 0.5 mg/dL (ref 0.0–1.2)
Total Protein: 7.9 g/dL (ref 6.5–8.1)

## 2024-10-03 LAB — CBC WITH DIFFERENTIAL (CANCER CENTER ONLY)
Abs Immature Granulocytes: 0.01 10*3/uL (ref 0.00–0.07)
Basophils Absolute: 0.1 10*3/uL (ref 0.0–0.1)
Basophils Relative: 1 %
Eosinophils Absolute: 0.4 10*3/uL (ref 0.0–0.5)
Eosinophils Relative: 12 %
HCT: 41.5 % (ref 39.0–52.0)
Hemoglobin: 14.1 g/dL (ref 13.0–17.0)
Immature Granulocytes: 0 %
Lymphocytes Relative: 30 %
Lymphs Abs: 1.1 10*3/uL (ref 0.7–4.0)
MCH: 30.7 pg (ref 26.0–34.0)
MCHC: 34 g/dL (ref 30.0–36.0)
MCV: 90.4 fL (ref 80.0–100.0)
Monocytes Absolute: 0.6 10*3/uL (ref 0.1–1.0)
Monocytes Relative: 17 %
Neutro Abs: 1.4 10*3/uL — ABNORMAL LOW (ref 1.7–7.7)
Neutrophils Relative %: 40 %
Platelet Count: 172 10*3/uL (ref 150–400)
RBC: 4.59 MIL/uL (ref 4.22–5.81)
RDW: 13.1 % (ref 11.5–15.5)
WBC Count: 3.7 10*3/uL — ABNORMAL LOW (ref 4.0–10.5)
nRBC: 0 % (ref 0.0–0.2)

## 2024-10-03 LAB — PSA: Prostatic Specific Antigen: <0.02 ng/mL (ref 0.00–4.00)

## 2024-10-04 LAB — TESTOSTERONE: Testosterone: 39 ng/dL — ABNORMAL LOW (ref 264–916)

## 2025-01-10 ENCOUNTER — Inpatient Hospital Stay: Admitting: Hematology and Oncology

## 2025-01-10 ENCOUNTER — Inpatient Hospital Stay
